# Patient Record
Sex: Female | Born: 1935 | Race: White | Hispanic: No | Marital: Single | Smoking: Never smoker
Health system: Southern US, Community
[De-identification: ages and names within clinical notes are randomized; demographics above are authoritative.]

## PROBLEM LIST (undated history)

## (undated) DIAGNOSIS — F419 Anxiety disorder, unspecified: Secondary | ICD-10-CM

## (undated) DIAGNOSIS — T7840XA Allergy, unspecified, initial encounter: Secondary | ICD-10-CM

## (undated) DIAGNOSIS — I10 Essential (primary) hypertension: Secondary | ICD-10-CM

## (undated) DIAGNOSIS — M1990 Unspecified osteoarthritis, unspecified site: Secondary | ICD-10-CM

## (undated) DIAGNOSIS — E119 Type 2 diabetes mellitus without complications: Secondary | ICD-10-CM

## (undated) HISTORY — DX: Essential (primary) hypertension: I10

## (undated) HISTORY — DX: Unspecified osteoarthritis, unspecified site: M19.90

## (undated) HISTORY — DX: Allergy, unspecified, initial encounter: T78.40XA

## (undated) HISTORY — DX: Anxiety disorder, unspecified: F41.9

---

## 2009-06-16 ENCOUNTER — Ambulatory Visit: Payer: Self-pay | Admitting: Family Medicine

## 2009-06-22 ENCOUNTER — Ambulatory Visit: Payer: Self-pay | Admitting: Family Medicine

## 2009-07-23 ENCOUNTER — Ambulatory Visit: Payer: Self-pay | Admitting: Family Medicine

## 2009-08-22 ENCOUNTER — Ambulatory Visit: Payer: Self-pay | Admitting: Family Medicine

## 2013-11-26 DIAGNOSIS — M25519 Pain in unspecified shoulder: Secondary | ICD-10-CM | POA: Diagnosis not present

## 2013-12-06 DIAGNOSIS — M25519 Pain in unspecified shoulder: Secondary | ICD-10-CM | POA: Diagnosis not present

## 2013-12-11 DIAGNOSIS — M25519 Pain in unspecified shoulder: Secondary | ICD-10-CM | POA: Diagnosis not present

## 2013-12-19 DIAGNOSIS — F411 Generalized anxiety disorder: Secondary | ICD-10-CM | POA: Diagnosis not present

## 2013-12-19 DIAGNOSIS — I10 Essential (primary) hypertension: Secondary | ICD-10-CM | POA: Diagnosis not present

## 2013-12-19 DIAGNOSIS — Z0000 Encounter for general adult medical examination without abnormal findings: Secondary | ICD-10-CM | POA: Diagnosis not present

## 2013-12-19 DIAGNOSIS — M25519 Pain in unspecified shoulder: Secondary | ICD-10-CM | POA: Diagnosis not present

## 2013-12-19 DIAGNOSIS — E119 Type 2 diabetes mellitus without complications: Secondary | ICD-10-CM | POA: Diagnosis not present

## 2013-12-20 DIAGNOSIS — M25519 Pain in unspecified shoulder: Secondary | ICD-10-CM | POA: Diagnosis not present

## 2013-12-23 DIAGNOSIS — M25519 Pain in unspecified shoulder: Secondary | ICD-10-CM | POA: Diagnosis not present

## 2013-12-27 DIAGNOSIS — M25519 Pain in unspecified shoulder: Secondary | ICD-10-CM | POA: Diagnosis not present

## 2014-03-13 DIAGNOSIS — M25519 Pain in unspecified shoulder: Secondary | ICD-10-CM | POA: Diagnosis not present

## 2014-03-13 DIAGNOSIS — F411 Generalized anxiety disorder: Secondary | ICD-10-CM | POA: Diagnosis not present

## 2014-03-13 DIAGNOSIS — Z0000 Encounter for general adult medical examination without abnormal findings: Secondary | ICD-10-CM | POA: Diagnosis not present

## 2014-03-13 DIAGNOSIS — I10 Essential (primary) hypertension: Secondary | ICD-10-CM | POA: Diagnosis not present

## 2014-03-13 DIAGNOSIS — E119 Type 2 diabetes mellitus without complications: Secondary | ICD-10-CM | POA: Diagnosis not present

## 2014-06-19 DIAGNOSIS — M25519 Pain in unspecified shoulder: Secondary | ICD-10-CM | POA: Diagnosis not present

## 2014-06-19 DIAGNOSIS — Z0000 Encounter for general adult medical examination without abnormal findings: Secondary | ICD-10-CM | POA: Diagnosis not present

## 2014-06-19 DIAGNOSIS — I10 Essential (primary) hypertension: Secondary | ICD-10-CM | POA: Diagnosis not present

## 2014-06-19 DIAGNOSIS — F411 Generalized anxiety disorder: Secondary | ICD-10-CM | POA: Diagnosis not present

## 2014-06-19 DIAGNOSIS — E119 Type 2 diabetes mellitus without complications: Secondary | ICD-10-CM | POA: Diagnosis not present

## 2014-07-15 DIAGNOSIS — F411 Generalized anxiety disorder: Secondary | ICD-10-CM | POA: Diagnosis not present

## 2014-07-30 DIAGNOSIS — F411 Generalized anxiety disorder: Secondary | ICD-10-CM | POA: Diagnosis not present

## 2014-08-13 DIAGNOSIS — F411 Generalized anxiety disorder: Secondary | ICD-10-CM | POA: Diagnosis not present

## 2014-08-19 DIAGNOSIS — Z13820 Encounter for screening for osteoporosis: Secondary | ICD-10-CM | POA: Diagnosis not present

## 2014-08-19 DIAGNOSIS — F411 Generalized anxiety disorder: Secondary | ICD-10-CM | POA: Diagnosis not present

## 2014-08-19 DIAGNOSIS — M25519 Pain in unspecified shoulder: Secondary | ICD-10-CM | POA: Diagnosis not present

## 2014-08-19 DIAGNOSIS — Z0000 Encounter for general adult medical examination without abnormal findings: Secondary | ICD-10-CM | POA: Diagnosis not present

## 2014-08-19 DIAGNOSIS — M79609 Pain in unspecified limb: Secondary | ICD-10-CM | POA: Diagnosis not present

## 2014-08-22 DIAGNOSIS — M542 Cervicalgia: Secondary | ICD-10-CM | POA: Diagnosis not present

## 2014-08-22 DIAGNOSIS — M25512 Pain in left shoulder: Secondary | ICD-10-CM | POA: Diagnosis not present

## 2014-08-27 DIAGNOSIS — F411 Generalized anxiety disorder: Secondary | ICD-10-CM | POA: Diagnosis not present

## 2014-08-28 DIAGNOSIS — M25512 Pain in left shoulder: Secondary | ICD-10-CM | POA: Diagnosis not present

## 2014-08-28 DIAGNOSIS — M542 Cervicalgia: Secondary | ICD-10-CM | POA: Diagnosis not present

## 2014-08-28 DIAGNOSIS — Z780 Asymptomatic menopausal state: Secondary | ICD-10-CM | POA: Diagnosis not present

## 2014-08-28 DIAGNOSIS — Z13820 Encounter for screening for osteoporosis: Secondary | ICD-10-CM | POA: Diagnosis not present

## 2014-08-28 LAB — HM DEXA SCAN: HM DEXA SCAN: NORMAL

## 2014-09-02 DIAGNOSIS — M542 Cervicalgia: Secondary | ICD-10-CM | POA: Diagnosis not present

## 2014-09-02 DIAGNOSIS — M25512 Pain in left shoulder: Secondary | ICD-10-CM | POA: Diagnosis not present

## 2014-09-04 DIAGNOSIS — M542 Cervicalgia: Secondary | ICD-10-CM | POA: Diagnosis not present

## 2014-09-04 DIAGNOSIS — M25512 Pain in left shoulder: Secondary | ICD-10-CM | POA: Diagnosis not present

## 2014-09-09 DIAGNOSIS — M542 Cervicalgia: Secondary | ICD-10-CM | POA: Diagnosis not present

## 2014-09-09 DIAGNOSIS — M25512 Pain in left shoulder: Secondary | ICD-10-CM | POA: Diagnosis not present

## 2014-09-10 DIAGNOSIS — F411 Generalized anxiety disorder: Secondary | ICD-10-CM | POA: Diagnosis not present

## 2014-09-11 DIAGNOSIS — M542 Cervicalgia: Secondary | ICD-10-CM | POA: Diagnosis not present

## 2014-09-11 DIAGNOSIS — M25512 Pain in left shoulder: Secondary | ICD-10-CM | POA: Diagnosis not present

## 2014-09-16 DIAGNOSIS — M25512 Pain in left shoulder: Secondary | ICD-10-CM | POA: Diagnosis not present

## 2014-09-16 DIAGNOSIS — M542 Cervicalgia: Secondary | ICD-10-CM | POA: Diagnosis not present

## 2014-09-18 DIAGNOSIS — E119 Type 2 diabetes mellitus without complications: Secondary | ICD-10-CM | POA: Diagnosis not present

## 2014-09-18 DIAGNOSIS — I10 Essential (primary) hypertension: Secondary | ICD-10-CM | POA: Diagnosis not present

## 2014-09-18 DIAGNOSIS — F411 Generalized anxiety disorder: Secondary | ICD-10-CM | POA: Diagnosis not present

## 2014-09-18 DIAGNOSIS — Z1389 Encounter for screening for other disorder: Secondary | ICD-10-CM | POA: Diagnosis not present

## 2014-09-22 DIAGNOSIS — M542 Cervicalgia: Secondary | ICD-10-CM | POA: Diagnosis not present

## 2014-09-22 DIAGNOSIS — M25512 Pain in left shoulder: Secondary | ICD-10-CM | POA: Diagnosis not present

## 2014-09-24 DIAGNOSIS — F411 Generalized anxiety disorder: Secondary | ICD-10-CM | POA: Diagnosis not present

## 2014-10-08 DIAGNOSIS — F411 Generalized anxiety disorder: Secondary | ICD-10-CM | POA: Diagnosis not present

## 2014-10-22 DIAGNOSIS — F411 Generalized anxiety disorder: Secondary | ICD-10-CM | POA: Diagnosis not present

## 2014-11-05 DIAGNOSIS — F411 Generalized anxiety disorder: Secondary | ICD-10-CM | POA: Diagnosis not present

## 2014-11-19 DIAGNOSIS — F411 Generalized anxiety disorder: Secondary | ICD-10-CM | POA: Diagnosis not present

## 2014-12-23 DIAGNOSIS — M542 Cervicalgia: Secondary | ICD-10-CM | POA: Diagnosis not present

## 2014-12-23 DIAGNOSIS — F411 Generalized anxiety disorder: Secondary | ICD-10-CM | POA: Diagnosis not present

## 2014-12-23 DIAGNOSIS — M25512 Pain in left shoulder: Secondary | ICD-10-CM | POA: Diagnosis not present

## 2014-12-23 DIAGNOSIS — M25511 Pain in right shoulder: Secondary | ICD-10-CM | POA: Diagnosis not present

## 2014-12-25 DIAGNOSIS — E119 Type 2 diabetes mellitus without complications: Secondary | ICD-10-CM | POA: Diagnosis not present

## 2014-12-25 DIAGNOSIS — F411 Generalized anxiety disorder: Secondary | ICD-10-CM | POA: Diagnosis not present

## 2014-12-25 DIAGNOSIS — M25519 Pain in unspecified shoulder: Secondary | ICD-10-CM | POA: Diagnosis not present

## 2014-12-25 DIAGNOSIS — I10 Essential (primary) hypertension: Secondary | ICD-10-CM | POA: Diagnosis not present

## 2014-12-25 LAB — HEMOGLOBIN A1C: HEMOGLOBIN A1C: 6.5 % — AB (ref 4.0–6.0)

## 2015-01-13 DIAGNOSIS — M542 Cervicalgia: Secondary | ICD-10-CM | POA: Diagnosis not present

## 2015-01-13 DIAGNOSIS — M25512 Pain in left shoulder: Secondary | ICD-10-CM | POA: Diagnosis not present

## 2015-01-13 DIAGNOSIS — M25511 Pain in right shoulder: Secondary | ICD-10-CM | POA: Diagnosis not present

## 2015-01-14 DIAGNOSIS — F411 Generalized anxiety disorder: Secondary | ICD-10-CM | POA: Diagnosis not present

## 2015-01-15 DIAGNOSIS — M25511 Pain in right shoulder: Secondary | ICD-10-CM | POA: Diagnosis not present

## 2015-01-15 DIAGNOSIS — M25512 Pain in left shoulder: Secondary | ICD-10-CM | POA: Diagnosis not present

## 2015-01-15 DIAGNOSIS — M542 Cervicalgia: Secondary | ICD-10-CM | POA: Diagnosis not present

## 2015-01-21 DIAGNOSIS — M25512 Pain in left shoulder: Secondary | ICD-10-CM | POA: Diagnosis not present

## 2015-01-21 DIAGNOSIS — M25511 Pain in right shoulder: Secondary | ICD-10-CM | POA: Diagnosis not present

## 2015-01-21 DIAGNOSIS — M542 Cervicalgia: Secondary | ICD-10-CM | POA: Diagnosis not present

## 2015-01-22 DIAGNOSIS — M25511 Pain in right shoulder: Secondary | ICD-10-CM | POA: Diagnosis not present

## 2015-01-22 DIAGNOSIS — M542 Cervicalgia: Secondary | ICD-10-CM | POA: Diagnosis not present

## 2015-01-22 DIAGNOSIS — M25512 Pain in left shoulder: Secondary | ICD-10-CM | POA: Diagnosis not present

## 2015-01-27 DIAGNOSIS — M542 Cervicalgia: Secondary | ICD-10-CM | POA: Diagnosis not present

## 2015-01-27 DIAGNOSIS — M25511 Pain in right shoulder: Secondary | ICD-10-CM | POA: Diagnosis not present

## 2015-01-27 DIAGNOSIS — M25512 Pain in left shoulder: Secondary | ICD-10-CM | POA: Diagnosis not present

## 2015-01-28 DIAGNOSIS — F411 Generalized anxiety disorder: Secondary | ICD-10-CM | POA: Diagnosis not present

## 2015-01-29 DIAGNOSIS — M25511 Pain in right shoulder: Secondary | ICD-10-CM | POA: Diagnosis not present

## 2015-01-29 DIAGNOSIS — M25512 Pain in left shoulder: Secondary | ICD-10-CM | POA: Diagnosis not present

## 2015-01-29 DIAGNOSIS — M542 Cervicalgia: Secondary | ICD-10-CM | POA: Diagnosis not present

## 2015-02-03 DIAGNOSIS — M25511 Pain in right shoulder: Secondary | ICD-10-CM | POA: Diagnosis not present

## 2015-02-03 DIAGNOSIS — M542 Cervicalgia: Secondary | ICD-10-CM | POA: Diagnosis not present

## 2015-02-03 DIAGNOSIS — M25512 Pain in left shoulder: Secondary | ICD-10-CM | POA: Diagnosis not present

## 2015-02-10 DIAGNOSIS — M25511 Pain in right shoulder: Secondary | ICD-10-CM | POA: Diagnosis not present

## 2015-02-10 DIAGNOSIS — M25512 Pain in left shoulder: Secondary | ICD-10-CM | POA: Diagnosis not present

## 2015-02-10 DIAGNOSIS — M542 Cervicalgia: Secondary | ICD-10-CM | POA: Diagnosis not present

## 2015-02-12 DIAGNOSIS — M25511 Pain in right shoulder: Secondary | ICD-10-CM | POA: Diagnosis not present

## 2015-02-12 DIAGNOSIS — M25512 Pain in left shoulder: Secondary | ICD-10-CM | POA: Diagnosis not present

## 2015-02-12 DIAGNOSIS — M542 Cervicalgia: Secondary | ICD-10-CM | POA: Diagnosis not present

## 2015-02-17 DIAGNOSIS — M25511 Pain in right shoulder: Secondary | ICD-10-CM | POA: Diagnosis not present

## 2015-02-17 DIAGNOSIS — M25512 Pain in left shoulder: Secondary | ICD-10-CM | POA: Diagnosis not present

## 2015-02-17 DIAGNOSIS — M542 Cervicalgia: Secondary | ICD-10-CM | POA: Diagnosis not present

## 2015-02-18 DIAGNOSIS — F411 Generalized anxiety disorder: Secondary | ICD-10-CM | POA: Diagnosis not present

## 2015-02-19 DIAGNOSIS — M542 Cervicalgia: Secondary | ICD-10-CM | POA: Diagnosis not present

## 2015-02-19 DIAGNOSIS — M25511 Pain in right shoulder: Secondary | ICD-10-CM | POA: Diagnosis not present

## 2015-02-19 DIAGNOSIS — M25512 Pain in left shoulder: Secondary | ICD-10-CM | POA: Diagnosis not present

## 2015-02-21 DIAGNOSIS — M542 Cervicalgia: Secondary | ICD-10-CM | POA: Diagnosis not present

## 2015-02-21 DIAGNOSIS — M25512 Pain in left shoulder: Secondary | ICD-10-CM | POA: Diagnosis not present

## 2015-02-21 DIAGNOSIS — M25511 Pain in right shoulder: Secondary | ICD-10-CM | POA: Diagnosis not present

## 2015-02-24 DIAGNOSIS — M542 Cervicalgia: Secondary | ICD-10-CM | POA: Diagnosis not present

## 2015-02-24 DIAGNOSIS — M25512 Pain in left shoulder: Secondary | ICD-10-CM | POA: Diagnosis not present

## 2015-02-24 DIAGNOSIS — M25511 Pain in right shoulder: Secondary | ICD-10-CM | POA: Diagnosis not present

## 2015-03-03 DIAGNOSIS — M25512 Pain in left shoulder: Secondary | ICD-10-CM | POA: Diagnosis not present

## 2015-03-03 DIAGNOSIS — M542 Cervicalgia: Secondary | ICD-10-CM | POA: Diagnosis not present

## 2015-03-03 DIAGNOSIS — M25511 Pain in right shoulder: Secondary | ICD-10-CM | POA: Diagnosis not present

## 2015-03-05 DIAGNOSIS — M542 Cervicalgia: Secondary | ICD-10-CM | POA: Diagnosis not present

## 2015-03-05 DIAGNOSIS — M25512 Pain in left shoulder: Secondary | ICD-10-CM | POA: Diagnosis not present

## 2015-03-05 DIAGNOSIS — M25511 Pain in right shoulder: Secondary | ICD-10-CM | POA: Diagnosis not present

## 2015-03-10 DIAGNOSIS — M25512 Pain in left shoulder: Secondary | ICD-10-CM | POA: Diagnosis not present

## 2015-03-10 DIAGNOSIS — M542 Cervicalgia: Secondary | ICD-10-CM | POA: Diagnosis not present

## 2015-03-10 DIAGNOSIS — M25511 Pain in right shoulder: Secondary | ICD-10-CM | POA: Diagnosis not present

## 2015-03-12 DIAGNOSIS — M542 Cervicalgia: Secondary | ICD-10-CM | POA: Diagnosis not present

## 2015-03-12 DIAGNOSIS — M25511 Pain in right shoulder: Secondary | ICD-10-CM | POA: Diagnosis not present

## 2015-03-12 DIAGNOSIS — M25512 Pain in left shoulder: Secondary | ICD-10-CM | POA: Diagnosis not present

## 2015-03-17 DIAGNOSIS — M542 Cervicalgia: Secondary | ICD-10-CM | POA: Diagnosis not present

## 2015-03-17 DIAGNOSIS — M25511 Pain in right shoulder: Secondary | ICD-10-CM | POA: Diagnosis not present

## 2015-03-17 DIAGNOSIS — M25512 Pain in left shoulder: Secondary | ICD-10-CM | POA: Diagnosis not present

## 2015-03-18 DIAGNOSIS — F411 Generalized anxiety disorder: Secondary | ICD-10-CM | POA: Diagnosis not present

## 2015-03-19 DIAGNOSIS — M25512 Pain in left shoulder: Secondary | ICD-10-CM | POA: Diagnosis not present

## 2015-03-19 DIAGNOSIS — M25511 Pain in right shoulder: Secondary | ICD-10-CM | POA: Diagnosis not present

## 2015-03-19 DIAGNOSIS — M542 Cervicalgia: Secondary | ICD-10-CM | POA: Diagnosis not present

## 2015-03-23 DIAGNOSIS — M542 Cervicalgia: Secondary | ICD-10-CM | POA: Diagnosis not present

## 2015-03-23 DIAGNOSIS — M25512 Pain in left shoulder: Secondary | ICD-10-CM | POA: Diagnosis not present

## 2015-03-23 DIAGNOSIS — M25511 Pain in right shoulder: Secondary | ICD-10-CM | POA: Diagnosis not present

## 2015-03-24 DIAGNOSIS — M25512 Pain in left shoulder: Secondary | ICD-10-CM | POA: Diagnosis not present

## 2015-03-24 DIAGNOSIS — M25511 Pain in right shoulder: Secondary | ICD-10-CM | POA: Diagnosis not present

## 2015-03-24 DIAGNOSIS — M542 Cervicalgia: Secondary | ICD-10-CM | POA: Diagnosis not present

## 2015-03-26 DIAGNOSIS — M25512 Pain in left shoulder: Secondary | ICD-10-CM | POA: Diagnosis not present

## 2015-03-26 DIAGNOSIS — M542 Cervicalgia: Secondary | ICD-10-CM | POA: Diagnosis not present

## 2015-03-26 DIAGNOSIS — M25511 Pain in right shoulder: Secondary | ICD-10-CM | POA: Diagnosis not present

## 2015-03-31 DIAGNOSIS — M542 Cervicalgia: Secondary | ICD-10-CM | POA: Diagnosis not present

## 2015-03-31 DIAGNOSIS — M25511 Pain in right shoulder: Secondary | ICD-10-CM | POA: Diagnosis not present

## 2015-03-31 DIAGNOSIS — M25512 Pain in left shoulder: Secondary | ICD-10-CM | POA: Diagnosis not present

## 2015-04-01 DIAGNOSIS — I10 Essential (primary) hypertension: Secondary | ICD-10-CM | POA: Diagnosis not present

## 2015-04-01 DIAGNOSIS — M25519 Pain in unspecified shoulder: Secondary | ICD-10-CM | POA: Diagnosis not present

## 2015-04-01 DIAGNOSIS — E119 Type 2 diabetes mellitus without complications: Secondary | ICD-10-CM | POA: Diagnosis not present

## 2015-04-01 DIAGNOSIS — F411 Generalized anxiety disorder: Secondary | ICD-10-CM | POA: Diagnosis not present

## 2015-04-02 DIAGNOSIS — M25512 Pain in left shoulder: Secondary | ICD-10-CM | POA: Diagnosis not present

## 2015-04-02 DIAGNOSIS — M25511 Pain in right shoulder: Secondary | ICD-10-CM | POA: Diagnosis not present

## 2015-04-02 DIAGNOSIS — M542 Cervicalgia: Secondary | ICD-10-CM | POA: Diagnosis not present

## 2015-04-07 DIAGNOSIS — M25511 Pain in right shoulder: Secondary | ICD-10-CM | POA: Diagnosis not present

## 2015-04-07 DIAGNOSIS — M542 Cervicalgia: Secondary | ICD-10-CM | POA: Diagnosis not present

## 2015-04-07 DIAGNOSIS — M25512 Pain in left shoulder: Secondary | ICD-10-CM | POA: Diagnosis not present

## 2015-04-08 DIAGNOSIS — F411 Generalized anxiety disorder: Secondary | ICD-10-CM | POA: Diagnosis not present

## 2015-04-09 DIAGNOSIS — M25511 Pain in right shoulder: Secondary | ICD-10-CM | POA: Diagnosis not present

## 2015-04-09 DIAGNOSIS — M542 Cervicalgia: Secondary | ICD-10-CM | POA: Diagnosis not present

## 2015-04-09 DIAGNOSIS — M25512 Pain in left shoulder: Secondary | ICD-10-CM | POA: Diagnosis not present

## 2015-04-14 DIAGNOSIS — M25512 Pain in left shoulder: Secondary | ICD-10-CM | POA: Diagnosis not present

## 2015-04-14 DIAGNOSIS — M25511 Pain in right shoulder: Secondary | ICD-10-CM | POA: Diagnosis not present

## 2015-04-14 DIAGNOSIS — M542 Cervicalgia: Secondary | ICD-10-CM | POA: Diagnosis not present

## 2015-04-15 DIAGNOSIS — F411 Generalized anxiety disorder: Secondary | ICD-10-CM | POA: Diagnosis not present

## 2015-04-16 DIAGNOSIS — M25511 Pain in right shoulder: Secondary | ICD-10-CM | POA: Diagnosis not present

## 2015-04-16 DIAGNOSIS — M542 Cervicalgia: Secondary | ICD-10-CM | POA: Diagnosis not present

## 2015-04-16 DIAGNOSIS — M25512 Pain in left shoulder: Secondary | ICD-10-CM | POA: Diagnosis not present

## 2015-04-21 DIAGNOSIS — M25511 Pain in right shoulder: Secondary | ICD-10-CM | POA: Diagnosis not present

## 2015-04-21 DIAGNOSIS — M542 Cervicalgia: Secondary | ICD-10-CM | POA: Diagnosis not present

## 2015-04-21 DIAGNOSIS — M25512 Pain in left shoulder: Secondary | ICD-10-CM | POA: Diagnosis not present

## 2015-04-23 DIAGNOSIS — M25511 Pain in right shoulder: Secondary | ICD-10-CM | POA: Diagnosis not present

## 2015-04-23 DIAGNOSIS — M542 Cervicalgia: Secondary | ICD-10-CM | POA: Diagnosis not present

## 2015-04-23 DIAGNOSIS — M25512 Pain in left shoulder: Secondary | ICD-10-CM | POA: Diagnosis not present

## 2015-04-28 DIAGNOSIS — M25512 Pain in left shoulder: Secondary | ICD-10-CM | POA: Diagnosis not present

## 2015-04-28 DIAGNOSIS — M542 Cervicalgia: Secondary | ICD-10-CM | POA: Diagnosis not present

## 2015-04-28 DIAGNOSIS — M25511 Pain in right shoulder: Secondary | ICD-10-CM | POA: Diagnosis not present

## 2015-04-30 DIAGNOSIS — M25512 Pain in left shoulder: Secondary | ICD-10-CM | POA: Diagnosis not present

## 2015-04-30 DIAGNOSIS — M542 Cervicalgia: Secondary | ICD-10-CM | POA: Diagnosis not present

## 2015-04-30 DIAGNOSIS — M25511 Pain in right shoulder: Secondary | ICD-10-CM | POA: Diagnosis not present

## 2015-05-03 DIAGNOSIS — F410 Panic disorder [episodic paroxysmal anxiety] without agoraphobia: Secondary | ICD-10-CM | POA: Insufficient documentation

## 2015-05-03 DIAGNOSIS — M545 Low back pain, unspecified: Secondary | ICD-10-CM | POA: Insufficient documentation

## 2015-05-03 DIAGNOSIS — E119 Type 2 diabetes mellitus without complications: Secondary | ICD-10-CM | POA: Insufficient documentation

## 2015-05-03 DIAGNOSIS — M79603 Pain in arm, unspecified: Secondary | ICD-10-CM | POA: Insufficient documentation

## 2015-05-03 DIAGNOSIS — F419 Anxiety disorder, unspecified: Secondary | ICD-10-CM | POA: Insufficient documentation

## 2015-05-03 DIAGNOSIS — F411 Generalized anxiety disorder: Secondary | ICD-10-CM | POA: Insufficient documentation

## 2015-05-03 DIAGNOSIS — I10 Essential (primary) hypertension: Secondary | ICD-10-CM | POA: Insufficient documentation

## 2015-05-03 DIAGNOSIS — M1990 Unspecified osteoarthritis, unspecified site: Secondary | ICD-10-CM | POA: Insufficient documentation

## 2015-05-03 DIAGNOSIS — J309 Allergic rhinitis, unspecified: Secondary | ICD-10-CM | POA: Insufficient documentation

## 2015-05-06 DIAGNOSIS — F411 Generalized anxiety disorder: Secondary | ICD-10-CM | POA: Diagnosis not present

## 2015-05-20 DIAGNOSIS — F411 Generalized anxiety disorder: Secondary | ICD-10-CM | POA: Diagnosis not present

## 2015-05-22 DIAGNOSIS — F411 Generalized anxiety disorder: Secondary | ICD-10-CM | POA: Diagnosis not present

## 2015-05-29 ENCOUNTER — Other Ambulatory Visit: Payer: Self-pay | Admitting: Family Medicine

## 2015-05-29 DIAGNOSIS — E119 Type 2 diabetes mellitus without complications: Secondary | ICD-10-CM

## 2015-06-24 DIAGNOSIS — F411 Generalized anxiety disorder: Secondary | ICD-10-CM | POA: Diagnosis not present

## 2015-07-08 DIAGNOSIS — F411 Generalized anxiety disorder: Secondary | ICD-10-CM | POA: Diagnosis not present

## 2015-07-15 ENCOUNTER — Encounter: Payer: Self-pay | Admitting: Family Medicine

## 2015-07-15 ENCOUNTER — Ambulatory Visit (INDEPENDENT_AMBULATORY_CARE_PROVIDER_SITE_OTHER): Payer: Medicare Other | Admitting: Family Medicine

## 2015-07-15 VITALS — BP 152/90 | HR 60 | Temp 98.2°F | Resp 16 | Ht <= 58 in | Wt 132.6 lb

## 2015-07-15 DIAGNOSIS — F419 Anxiety disorder, unspecified: Secondary | ICD-10-CM | POA: Diagnosis not present

## 2015-07-15 DIAGNOSIS — J309 Allergic rhinitis, unspecified: Secondary | ICD-10-CM

## 2015-07-15 DIAGNOSIS — E119 Type 2 diabetes mellitus without complications: Secondary | ICD-10-CM

## 2015-07-15 DIAGNOSIS — R202 Paresthesia of skin: Secondary | ICD-10-CM | POA: Diagnosis not present

## 2015-07-15 DIAGNOSIS — I10 Essential (primary) hypertension: Secondary | ICD-10-CM | POA: Diagnosis not present

## 2015-07-15 DIAGNOSIS — F411 Generalized anxiety disorder: Secondary | ICD-10-CM | POA: Diagnosis not present

## 2015-07-15 LAB — POCT GLYCOSYLATED HEMOGLOBIN (HGB A1C): Hemoglobin A1C: 6.7

## 2015-07-15 MED ORDER — VITAMIN B-12 1000 MCG PO TABS: 1000.0000 ug | ORAL_TABLET | Freq: Every day | ORAL | Status: AC

## 2015-07-15 NOTE — Progress Notes (Signed)
Subjective:    Patient ID: Sierra Lloyd, female    DOB: [birth year], 81 y.o.   MRN: [id number]  Diabetes Associated symptoms include polyuria.    Diabetes Mellitus Type II, Follow-up:   Here for recheck on her diabetes.  Lab Results  Component Value Date   HGBA1C 6.5* 12/25/2014    Last seen for diabetes 3 months ago.  Management changes included none. She reports excellent compliance with treatment. She is not having side effects. Current symptoms include polyuria and have been unchanged. Home blood sugar records: fasting range: 130-150  Episodes of hypoglycemia? no   Current Insulin Regimen: none  Most Recent Eye Exam:  Weight trend: stable Prior visit with dietician: no Current diet: well balanced Current exercise: PT times 3 months per pt  Pertinent Labs: No results found for: CHOL, TRIG, CHOLHDL, CREATININE   Wt Readings from Last 3 Encounters:  04/01/15 131 lb (59.421 kg)   Still having a really hard time related to the death of her brother in law. Having night terrors.  Working with her therapist every 2 weeks.  Stressed about the new computer system today.  Really just having a hard time today.    Has finished her therapy. Had a hard time with getting her insurance straight. That stressed her also.   Is really missing Sierra Lloyd thru all of this.   Her new therapist is not a friendly as Sierra Lloyd. Feels she helped her more than anybody.  ------------------------------------------------------------------------    Review of Systems  Constitutional: Negative.   Respiratory: Negative.   Cardiovascular: Negative.   Endocrine: Positive for polyuria.    Patient Active Problem List   Diagnosis Date Noted  . Allergic rhinitis 05/03/2015  . Anxiety 05/03/2015  . Arm pain 05/03/2015  . Arthritis 05/03/2015  . Diabetes 05/03/2015  . Anxiety, generalized 05/03/2015  . BP (high blood pressure) 05/03/2015  . LBP (low back pain) 05/03/2015  . Panic attack  05/03/2015   Past Medical History  Diagnosis Date  . Allergy   . Anxiety   . Hypertension   . Arthritis    Current Outpatient Prescriptions on File Prior to Visit  Medication Sig  . ALPRAZolam (XANAX) 0.25 MG tablet Take 0.25 mg by mouth.   . bisoprolol-hydrochlorothiazide (ZIAC) 10-6.25 MG per tablet Take 1 tablet by mouth daily.   . cholecalciferol (VITAMIN D) 1000 UNITS tablet Take 1,000 Units by mouth.   . etodolac (LODINE) 500 MG tablet Take 500 mg by mouth daily.   . Garlic Oil 500 MG CAPS Take by mouth.   Marland Kitchen glucose blood (BAYER CONTOUR TEST) test strip BAYER CONTOUR TEST (In Vitro Strip)  1 Strip Strip to test blood sugars for 0 days  Quantity: 50;  Refills: 5   Ordered :04-Oct-2014  Lorie Phenix MD;  Started 23-Sep-2014 Active Comments: E11.9 Bayer Contour Next test strips only.  . metFORMIN (GLUCOPHAGE) 500 MG tablet TAKE 1 TABLET BY MOUTH TWICE DAILY.  Marland Kitchen OMEGA-3 FATTY ACIDS PO Take by mouth.   . TRIAMCINOLONE ACETONIDE, NASAL STEROIDS, Place 2 sprays into the nose daily.   Marland Kitchen VITAMIN E-1000 PO Take by mouth.    No current facility-administered medications on file prior to visit.   Allergies  Allergen Reactions  . Tetracycline     Roof of mouth broke out.   History reviewed. No pertinent past surgical history. Social History   Social History  . Marital Status: Single    Spouse Name: N/A  . Number of Children: N/A  .  Years of Education: N/A   Occupational History  . Not on file.   Social History Main Topics  . Smoking status: Never Smoker   . Smokeless tobacco: Never Used  . Alcohol Use: No  . Drug Use: No  . Sexual Activity: Not on file   Other Topics Concern  . Not on file   Social History Narrative   Family History  Problem Relation Age of Onset  . Asthma Mother   . Anxiety disorder Mother   . Lung disease Mother   . COPD Sister         Objective:   Physical Exam  Constitutional: She is oriented to person, place, and time. She appears  well-developed and well-nourished.  Neurological: She is alert and oriented to person, place, and time.   Diabetic Foot Exam - Simple   Simple Foot Form  Visual Inspection  No deformities, no ulcerations, no other skin breakdown bilaterally:  Yes  Sensation Testing  Intact to touch and monofilament testing bilaterally:  Yes  Pulse Check  Posterior Tibialis and Dorsalis pulse intact bilaterally:  Yes  Comments      BP 152/90 mmHg  Pulse 60  Temp(Src) 98.2 F (36.8 C) (Oral)  Resp 16  Ht 4\' 9"  (1.448 m)  Wt 132 lb 9.6 oz (60.147 kg)  BMI 28.69 kg/m2      Assessment & Plan:  1. Type 2 diabetes mellitus without complication Stable. Continue current medication and plan of care.  - POCT glycosylated hemoglobin (Hb A1C) Results for orders placed or performed in visit on 07/15/15  POCT glycosylated hemoglobin (Hb A1C)  Result Value Ref Range   Hemoglobin A1C 6.7     2. Essential hypertension Stable. Current medication.   3. Allergic rhinitis, unspecified allergic rhinitis type Stable.   4. Anxiety, generalized Worsening. Will follow up with therapist.   5. Paresthesias- Will try a B12 supplement. Refused labs today. Has needle phobia.   Lorie Phenix, MD

## 2015-07-24 ENCOUNTER — Other Ambulatory Visit: Payer: Self-pay | Admitting: Family Medicine

## 2015-07-24 DIAGNOSIS — F411 Generalized anxiety disorder: Secondary | ICD-10-CM

## 2015-07-24 NOTE — Telephone Encounter (Signed)
Ok to call in rx.  Thanks.  

## 2015-07-24 NOTE — Telephone Encounter (Signed)
Xanax called into pharmacy. sd

## 2015-08-05 DIAGNOSIS — F411 Generalized anxiety disorder: Secondary | ICD-10-CM | POA: Diagnosis not present

## 2015-08-19 DIAGNOSIS — F411 Generalized anxiety disorder: Secondary | ICD-10-CM | POA: Diagnosis not present

## 2015-09-02 DIAGNOSIS — F411 Generalized anxiety disorder: Secondary | ICD-10-CM | POA: Diagnosis not present

## 2015-09-11 ENCOUNTER — Other Ambulatory Visit: Payer: Self-pay | Admitting: Family Medicine

## 2015-09-11 DIAGNOSIS — E119 Type 2 diabetes mellitus without complications: Secondary | ICD-10-CM

## 2015-09-16 DIAGNOSIS — F341 Dysthymic disorder: Secondary | ICD-10-CM | POA: Diagnosis not present

## 2015-09-23 DIAGNOSIS — F341 Dysthymic disorder: Secondary | ICD-10-CM | POA: Diagnosis not present

## 2015-10-07 DIAGNOSIS — F341 Dysthymic disorder: Secondary | ICD-10-CM | POA: Diagnosis not present

## 2015-10-15 ENCOUNTER — Other Ambulatory Visit: Payer: Self-pay | Admitting: Family Medicine

## 2015-10-15 DIAGNOSIS — M1990 Unspecified osteoarthritis, unspecified site: Secondary | ICD-10-CM

## 2015-10-15 DIAGNOSIS — M545 Low back pain: Secondary | ICD-10-CM

## 2015-10-15 DIAGNOSIS — I10 Essential (primary) hypertension: Secondary | ICD-10-CM

## 2015-10-15 NOTE — Telephone Encounter (Signed)
Appointment scheduled for 10/22/2015. Allene DillonEmily Drozdowski, CMA

## 2015-10-22 ENCOUNTER — Ambulatory Visit: Payer: Medicare Other | Admitting: Family Medicine

## 2015-11-03 ENCOUNTER — Ambulatory Visit: Payer: Medicare Other | Admitting: Family Medicine

## 2015-11-04 DIAGNOSIS — F341 Dysthymic disorder: Secondary | ICD-10-CM | POA: Diagnosis not present

## 2015-11-12 ENCOUNTER — Other Ambulatory Visit: Payer: Self-pay | Admitting: Family Medicine

## 2015-11-12 DIAGNOSIS — E119 Type 2 diabetes mellitus without complications: Secondary | ICD-10-CM

## 2015-11-18 ENCOUNTER — Encounter: Payer: Self-pay | Admitting: Family Medicine

## 2015-11-18 ENCOUNTER — Ambulatory Visit (INDEPENDENT_AMBULATORY_CARE_PROVIDER_SITE_OTHER): Payer: Medicare Other | Admitting: Family Medicine

## 2015-11-18 VITALS — BP 120/80 | HR 64 | Temp 98.7°F | Resp 16 | Ht <= 58 in | Wt 133.0 lb

## 2015-11-18 DIAGNOSIS — I10 Essential (primary) hypertension: Secondary | ICD-10-CM

## 2015-11-18 DIAGNOSIS — E119 Type 2 diabetes mellitus without complications: Secondary | ICD-10-CM | POA: Diagnosis not present

## 2015-11-18 DIAGNOSIS — F411 Generalized anxiety disorder: Secondary | ICD-10-CM

## 2015-11-18 LAB — POCT GLYCOSYLATED HEMOGLOBIN (HGB A1C)
Est. average glucose Bld gHb Est-mCnc: 154
HEMOGLOBIN A1C: 7

## 2015-11-18 NOTE — Progress Notes (Signed)
Patient ID: Sierra Lloyd, female   DOB: 1934/02/03, 81 y.o.   MRN: [id number]        Patient: Sierra Lloyd Female    DOB: [birth year]   81 y.o.   MRN: [id number] Visit Date: 11/18/2015  Today's Provider: Lorie Phenix, MD   Chief Complaint  Patient presents with  . Diabetes  . Anxiety  . Hypertension   Subjective:    HPI  Diabetes Mellitus Type II, Follow-up:   Lab Results  Component Value Date   HGBA1C 7.0 11/18/2015   HGBA1C 6.7 07/15/2015   HGBA1C 6.5* 12/25/2014    Last seen for diabetes 4 months ago.  Management since then includes none. She reports excellent compliance with treatment. She is not having side effects.  Current symptoms include none and have been stable. Home blood sugar records:  Fasting 120s to 150s.  Mainly in the 130s.  136 this am.   Episodes of hypoglycemia? yes - 1 or 2 times in the last 4 months.  Know to eat sweets.     Current Insulin Regimen: none Most Recent Eye Exam: up to date Weight trend: stable Prior visit with dietician: no Current diet: in general, a "healthy" diet    Has lost her taste for sweets.  Current exercise: PT  Has not had Christmas yet.  Sister has been sick.   Pertinent Labs: No results found for: CHOL, TRIG, HDL, LDLCALC, CREATININE  Wt Readings from Last 3 Encounters:  11/18/15 133 lb (60.328 kg)  07/15/15 132 lb 9.6 oz (60.147 kg)  04/01/15 131 lb (59.421 kg)    ------------------------------------------------------------------------  Anxiety, follow-up: Patient her to follow-up on anxiety disorder.  She has the following symptoms: panic attacks. Onset of symptoms was approximately many years  ago, gradually worsening since that time. She denies current suicidal and homicidal ideation. Family history significant for no psychiatric illness.Possible organic causes contributing are: none. Risk factors: none Previous treatment includes Xanax and medication.  She complains of the following side effects from  the treatment: none. Still having a lot of anxiety.  Worries a lot about loosing her independence.     Hypertension, follow-up:  BP Readings from Last 3 Encounters:  11/18/15 120/80  07/15/15 152/90  04/01/15 138/84    She was last seen for hypertension 4 months ago.  BP at that visit was 152/90. Management changes since that visit include none. She reports excellent compliance with treatment. She is not having side effects.  She is exercising. She is adherent to low salt diet.   Outside blood pressures are stable. She is experiencing none.  Patient denies chest pain.   Cardiovascular risk factors include diabetes mellitus.  Use of agents associated with hypertension: none.     Weight trend: stable Wt Readings from Last 3 Encounters:  11/18/15 133 lb (60.328 kg)  07/15/15 132 lb 9.6 oz (60.147 kg)  04/01/15 131 lb (59.421 kg)    Current diet: in general, a "healthy" diet  . Has lost her taste for sweets.    ------------------------------------------------------------------------       Allergies  Allergen Reactions  . Tetracycline     Roof of mouth broke out.   Previous Medications   BISOPROLOL-HYDROCHLOROTHIAZIDE (ZIAC) 10-6.25 MG TABLET    TAKE 1 TABLET BY MOUTH ONCE DAILY.   CHOLECALCIFEROL (VITAMIN D) 1000 UNITS TABLET    Take 1,000 Units by mouth.    ETODOLAC (LODINE) 500 MG TABLET    TAKE 1 TABLET BY MOUTH TWICE DAILY AS  NEEDED FOR PAIN.   GARLIC OIL 500 MG CAPS    Take by mouth.    GLUCOSE BLOOD TEST STRIP    Use to test blood sugar once a day.  DX: E11.9.   METFORMIN (GLUCOPHAGE) 500 MG TABLET    TAKE 1 TABLET BY MOUTH TWICE DAILY.   OMEGA-3 FATTY ACIDS PO    Take by mouth.    TRIAMCINOLONE ACETONIDE, NASAL STEROIDS,    Place 2 sprays into the nose daily.    VITAMIN B-12 (CYANOCOBALAMIN) 1000 MCG TABLET    Take 1 tablet (1,000 mcg total) by mouth daily.   VITAMIN E-1000 PO    Take by mouth.    XANAX 0.25 MG TABLET    TAKE 1/2 OR 1 TABLET BY MOUTH TWICE  DAILY AS NEEDED.    Review of Systems  Constitutional: Positive for activity change.  Respiratory: Negative.   Cardiovascular: Negative.   Psychiatric/Behavioral: Negative for suicidal ideas, confusion and dysphoric mood. The patient is nervous/anxious.     Social History  Substance Use Topics  . Smoking status: Never Smoker   . Smokeless tobacco: Never Used  . Alcohol Use: No   Objective:   BP 120/80 mmHg  Pulse 64  Temp(Src) 98.7 F (37.1 C) (Oral)  Resp 16  Ht 4\' 9"  (1.448 m)  Wt 133 lb (60.328 kg)  BMI 28.77 kg/m2  SpO2 98%  Physical Exam  Constitutional: She is oriented to person, place, and time. She appears well-developed and well-nourished.  Cardiovascular: Normal rate and regular rhythm.   Pulmonary/Chest: Effort normal and breath sounds normal.  Neurological: She is alert and oriented to person, place, and time.  Psychiatric: She has a normal mood and affect. Her behavior is normal. Judgment and thought content normal.      Assessment & Plan:     1. Diabetes mellitus without complication (HCC) Stable. At goal. No change in treatment plan.  Recheck in 3 months. Monitor for lows.   - POCT glycosylated hemoglobin (Hb A1C) Results for orders placed or performed in visit on 11/18/15  POCT glycosylated hemoglobin (Hb A1C)  Result Value Ref Range   Hemoglobin A1C 7.0    Est. average glucose Bld gHb Est-mCnc 154   .  2. Anxiety, generalized Unchanged. Continue current medication and follow up with specialist.   3. Essential hypertension Condition is stable. Please continue current medication and  plan of care.      Lorie PhenixNancy Brianca Fortenberry, MD  [facility]

## 2015-12-09 DIAGNOSIS — F411 Generalized anxiety disorder: Secondary | ICD-10-CM | POA: Diagnosis not present

## 2015-12-23 DIAGNOSIS — F411 Generalized anxiety disorder: Secondary | ICD-10-CM | POA: Diagnosis not present

## 2016-01-06 DIAGNOSIS — F411 Generalized anxiety disorder: Secondary | ICD-10-CM | POA: Diagnosis not present

## 2016-02-04 ENCOUNTER — Other Ambulatory Visit: Payer: Self-pay | Admitting: Family Medicine

## 2016-02-04 DIAGNOSIS — F411 Generalized anxiety disorder: Secondary | ICD-10-CM

## 2016-02-04 NOTE — Telephone Encounter (Signed)
LOV 11/18/2015. Last refill 07/24/2015.

## 2016-02-04 NOTE — Telephone Encounter (Signed)
Pt called saying she needs to get the xanax today.  She will check with the pharmacy around lunch.   Thanks Barth Kirkseri

## 2016-02-04 NOTE — Telephone Encounter (Signed)
Printed, please fax or call in to pharmacy. Thank you.   

## 2016-02-18 ENCOUNTER — Ambulatory Visit: Payer: Medicare Other | Admitting: Family Medicine

## 2016-02-25 ENCOUNTER — Encounter: Payer: Self-pay | Admitting: Family Medicine

## 2016-02-25 ENCOUNTER — Ambulatory Visit (INDEPENDENT_AMBULATORY_CARE_PROVIDER_SITE_OTHER): Payer: Medicare Other | Admitting: Family Medicine

## 2016-02-25 VITALS — BP 138/84 | HR 60 | Temp 97.9°F | Resp 16 | Ht <= 58 in | Wt 126.0 lb

## 2016-02-25 DIAGNOSIS — I10 Essential (primary) hypertension: Secondary | ICD-10-CM | POA: Diagnosis not present

## 2016-02-25 DIAGNOSIS — E119 Type 2 diabetes mellitus without complications: Secondary | ICD-10-CM

## 2016-02-25 DIAGNOSIS — F411 Generalized anxiety disorder: Secondary | ICD-10-CM | POA: Diagnosis not present

## 2016-02-25 LAB — POCT GLYCOSYLATED HEMOGLOBIN (HGB A1C)
Est. average glucose Bld gHb Est-mCnc: 148
Hemoglobin A1C: 6.8
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
## 2016-03-23 DIAGNOSIS — E119 Type 2 diabetes mellitus without complications: Secondary | ICD-10-CM | POA: Diagnosis not present

## 2016-03-24 ENCOUNTER — Encounter: Payer: Self-pay | Admitting: Family Medicine

## 2016-04-02 DIAGNOSIS — H269 Unspecified cataract: Secondary | ICD-10-CM | POA: Diagnosis not present

## 2016-04-07 ENCOUNTER — Encounter: Payer: Self-pay | Admitting: *Deleted

## 2016-04-11 NOTE — H&P (Signed)
See scanned note.

## 2016-04-12 ENCOUNTER — Ambulatory Visit
Admission: RE | Admit: 2016-04-12 | Discharge: 2016-04-12 | Disposition: A | Discharge status: Home / Self Care | Payer: Medicare Other | Source: Ambulatory Visit | Attending: Ophthalmology | Admitting: Ophthalmology

## 2016-04-12 ENCOUNTER — Encounter
Admission: RE | Disposition: A | Discharge status: Home / Self Care | Payer: Self-pay | Source: Ambulatory Visit | Attending: Ophthalmology

## 2016-04-12 ENCOUNTER — Ambulatory Visit: Payer: Medicare Other | Admitting: Anesthesiology

## 2016-04-12 ENCOUNTER — Encounter: Payer: Self-pay | Admitting: *Deleted

## 2016-04-12 DIAGNOSIS — H2512 Age-related nuclear cataract, left eye: Secondary | ICD-10-CM | POA: Diagnosis not present

## 2016-04-12 DIAGNOSIS — H25042 Posterior subcapsular polar age-related cataract, left eye: Secondary | ICD-10-CM | POA: Diagnosis not present

## 2016-04-12 DIAGNOSIS — M1990 Unspecified osteoarthritis, unspecified site: Secondary | ICD-10-CM | POA: Insufficient documentation

## 2016-04-12 DIAGNOSIS — H268 Other specified cataract: Secondary | ICD-10-CM | POA: Diagnosis present

## 2016-04-12 DIAGNOSIS — I10 Essential (primary) hypertension: Secondary | ICD-10-CM | POA: Insufficient documentation

## 2016-04-12 DIAGNOSIS — H259 Unspecified age-related cataract: Secondary | ICD-10-CM | POA: Diagnosis not present

## 2016-04-12 DIAGNOSIS — F419 Anxiety disorder, unspecified: Secondary | ICD-10-CM | POA: Diagnosis not present

## 2016-04-12 HISTORY — DX: Type 2 diabetes mellitus without complications: E11.9

## 2016-04-12 HISTORY — PX: CATARACT EXTRACTION W/PHACO: SHX586

## 2016-04-12 LAB — GLUCOSE, CAPILLARY: GLUCOSE-CAPILLARY: 149 mg/dL — AB (ref 65–99)

## 2016-04-12 SURGERY — PHACOEMULSIFICATION, CATARACT, WITH IOL INSERTION
Anesthesia: Monitor Anesthesia Care | Site: Eye | Laterality: Left | Wound class: Clean

## 2016-04-12 MED ORDER — SODIUM CHLORIDE 0.9 % IV SOLN
INTRAVENOUS | Status: DC
  Administered 2016-04-12: 08:00:00 via INTRAVENOUS

## 2016-04-12 MED ORDER — PHENYLEPHRINE HCL 10 % OP SOLN
1.0000 [drp] | OPHTHALMIC | Status: AC
  Administered 2016-04-12 (×4): 1 [drp] via OPHTHALMIC

## 2016-04-12 MED ORDER — LIDOCAINE HCL (PF) 4 % IJ SOLN
INTRAMUSCULAR | Status: DC | PRN
  Administered 2016-04-12: .5 mL via OPHTHALMIC

## 2016-04-12 MED ORDER — MOXIFLOXACIN HCL 0.5 % OP SOLN
1.0000 [drp] | OPHTHALMIC | Status: AC
  Administered 2016-04-12 (×3): 1 [drp] via OPHTHALMIC

## 2016-04-12 MED ORDER — ALPRAZOLAM 0.5 MG PO TABS
ORAL_TABLET | ORAL | Status: AC
  Administered 2016-04-12: 0.25 mg via ORAL
  Filled 2016-04-12: qty 1

## 2016-04-12 MED ORDER — POVIDONE-IODINE 5 % OP SOLN
OPHTHALMIC | Status: AC
  Filled 2016-04-12: qty 30

## 2016-04-12 MED ORDER — CYCLOPENTOLATE HCL 2 % OP SOLN
1.0000 [drp] | OPHTHALMIC | Status: AC
Start: 2016-04-12 — End: 2016-04-12
  Administered 2016-04-12 (×4): 1 [drp] via OPHTHALMIC

## 2016-04-12 MED ORDER — MIDAZOLAM HCL 5 MG/5ML IJ SOLN
INTRAMUSCULAR | Status: DC | PRN
  Administered 2016-04-12: 1 mg via INTRAVENOUS

## 2016-04-12 MED ORDER — CYCLOPENTOLATE HCL 2 % OP SOLN
OPHTHALMIC | Status: AC
  Administered 2016-04-12: 1 [drp] via OPHTHALMIC
  Filled 2016-04-12: qty 2

## 2016-04-12 MED ORDER — ALPRAZOLAM 0.5 MG PO TABS
0.2500 mg | ORAL_TABLET | Freq: Once | ORAL | Status: AC | PRN
  Administered 2016-04-12: 0.25 mg via ORAL

## 2016-04-12 MED ORDER — PHENYLEPHRINE HCL 10 % OP SOLN
OPHTHALMIC | Status: AC
  Administered 2016-04-12: 1 [drp] via OPHTHALMIC
  Filled 2016-04-12: qty 5

## 2016-04-12 MED ORDER — MOXIFLOXACIN HCL 0.5 % OP SOLN
OPHTHALMIC | Status: DC | PRN
  Administered 2016-04-12: 1 [drp] via OPHTHALMIC

## 2016-04-12 MED ORDER — NA CHONDROIT SULF-NA HYALURON 40-17 MG/ML IO SOLN
INTRAOCULAR | Status: AC
  Filled 2016-04-12: qty 1

## 2016-04-12 MED ORDER — BUPIVACAINE HCL (PF) 0.75 % IJ SOLN
INTRAMUSCULAR | Status: AC
  Filled 2016-04-12: qty 10

## 2016-04-12 MED ORDER — CARBACHOL 0.01 % IO SOLN
INTRAOCULAR | Status: DC | PRN
  Administered 2016-04-12: .5 mL via INTRAOCULAR

## 2016-04-12 MED ORDER — EPINEPHRINE HCL 1 MG/ML IJ SOLN
INTRAOCULAR | Status: DC | PRN
  Administered 2016-04-12: 1 mL via OPHTHALMIC

## 2016-04-12 MED ORDER — POVIDONE-IODINE 5 % OP SOLN
OPHTHALMIC | Status: DC | PRN
  Administered 2016-04-12: 1 via OPHTHALMIC

## 2016-04-12 MED ORDER — TETRACAINE HCL 0.5 % OP SOLN
OPHTHALMIC | Status: AC
  Filled 2016-04-12: qty 2

## 2016-04-12 MED ORDER — HYALURONIDASE HUMAN 150 UNIT/ML IJ SOLN
INTRAMUSCULAR | Status: AC
  Filled 2016-04-12: qty 1

## 2016-04-12 MED ORDER — CEFUROXIME OPHTHALMIC INJECTION 1 MG/0.1 ML
INJECTION | OPHTHALMIC | Status: AC
  Filled 2016-04-12: qty 0.1

## 2016-04-12 MED ORDER — LIDOCAINE HCL (PF) 4 % IJ SOLN
INTRAMUSCULAR | Status: AC
  Filled 2016-04-12: qty 5

## 2016-04-12 MED ORDER — LIDOCAINE HCL (PF) 4 % IJ SOLN
INTRAMUSCULAR | Status: DC | PRN
  Administered 2016-04-12: 4 mL via OPHTHALMIC

## 2016-04-12 MED ORDER — CEFUROXIME OPHTHALMIC INJECTION 1 MG/0.1 ML
INJECTION | OPHTHALMIC | Status: DC | PRN
  Administered 2016-04-12: .1 mL via INTRACAMERAL

## 2016-04-12 MED ORDER — TRYPAN BLUE 0.06 % OP SOLN
OPHTHALMIC | Status: AC
  Filled 2016-04-12: qty 0.5

## 2016-04-12 MED ORDER — NA CHONDROIT SULF-NA HYALURON 40-17 MG/ML IO SOLN
INTRAOCULAR | Status: DC | PRN
  Administered 2016-04-12: 1 mL via INTRAOCULAR

## 2016-04-12 MED ORDER — EPINEPHRINE HCL 1 MG/ML IJ SOLN
INTRAMUSCULAR | Status: AC
  Filled 2016-04-12: qty 2

## 2016-04-12 MED ORDER — ALFENTANIL 500 MCG/ML IJ INJ
INJECTION | INTRAMUSCULAR | Status: DC | PRN
  Administered 2016-04-12: 500 ug via INTRAVENOUS

## 2016-04-12 MED ORDER — MOXIFLOXACIN HCL 0.5 % OP SOLN
OPHTHALMIC | Status: AC
  Administered 2016-04-12: 1 [drp] via OPHTHALMIC
  Filled 2016-04-12: qty 3

## 2016-04-12 MED ORDER — TETRACAINE HCL 0.5 % OP SOLN
OPHTHALMIC | Status: DC | PRN
  Administered 2016-04-12: 1 [drp] via OPHTHALMIC

## 2016-04-12 SURGICAL SUPPLY — 29 items

## 2016-04-12 NOTE — Anesthesia Preprocedure Evaluation (Signed)
Anesthesia Evaluation  Patient identified by MRN, date of birth, ID band  Reviewed: Allergy & Precautions, NPO status , Patient's Chart, lab work & pertinent test results  History of Anesthesia Complications Negative for: history of anesthetic complications  Airway Mallampati: II       Dental  (+) Partial Upper   Pulmonary neg pulmonary ROS,           Cardiovascular hypertension, Pt. on medications      Neuro/Psych Anxiety negative neurological ROS     GI/Hepatic negative GI ROS, Neg liver ROS,   Endo/Other  diabetes, Type 2, Oral Hypoglycemic Agents  Renal/GU negative Renal ROS     Musculoskeletal   Abdominal   Peds  Hematology negative hematology ROS (+)   Anesthesia Other Findings   Reproductive/Obstetrics                             Anesthesia Physical Anesthesia Plan  ASA: II  Anesthesia Plan: MAC   Post-op Pain Management:    Induction: Intravenous  Airway Management Planned: Nasal Cannula  Additional Equipment:   Intra-op Plan:   Post-operative Plan:   Informed Consent: I have reviewed the patients History and Physical, chart, labs and discussed the procedure including the risks, benefits and alternatives for the proposed anesthesia with the patient or authorized representative who has indicated his/her understanding and acceptance.     Plan Discussed with:   Anesthesia Plan Comments:         Anesthesia Quick Evaluation

## 2016-04-12 NOTE — Op Note (Signed)
Date of Surgery: 04/12/2016 Date of Dictation: 04/12/2016 8:47 AM Pre-operative Diagnosis:  Nuclear Sclerotic Cataract and Posterior Subcapsular Cataract left Eye Post-operative Diagnosis: same Procedure performed: Extra-capsular Cataract Extraction (ECCE) with placement of a posterior chamber intraocular lens (IOL) left Eye IOL:  Implant Name Type Inv. Item Serial No. Manufacturer Lot No. LRB No. Used  LENS IOL ACRYSOF IQ 22.0 - [id number] Intraocular Lens LENS IOL ACRYSOF IQ 22.0 [id number] ALCON [id number] Left 1   Anesthesia: 2% Lidocaine and 4% Marcaine in a 50/50 mixture with 10 unites/ml of Hylenex given as a peribulbar Anesthesiologist: Anesthesiologist: Naomie DeanWilliam K Kephart, MD CRNA: Irving BurtonJennifer Bachich, CRNA; Michaele OfferKasey Savage, CRNA Complications: none Estimated Blood Loss: less than 1 ml  Description of procedure:  The patient was given anesthesia and sedation via intravenous access. The patient was then prepped and draped in the usual fashion. A 25-gauge needle was bent for initiating the capsulorhexis. A 5-0 silk suture was placed through the conjunctiva superior and inferiorly to serve as bridle sutures. Hemostasis was obtained at the superior limbus using an eraser cautery. A partial thickness groove was made at the anterior surgical limbus with a 64 Beaver blade and this was dissected anteriorly with an SYSCOlcon Crescent knife. The anterior chamber was entered at 10 o'clock with a 1.0 mm paracentesis knife and through the lamellar dissection with a 2.6 mm Alcon keratome. Epi-Shugarcaine 0.5 CC [9 cc BSS Plus (Alcon), 3 cc 4% preservative-free lidocaine (Hospira) and 4 cc 1:1000 preservative-free, bisulfite-free epinephrine] was injected into the anterior chamber via the paracentesis tract. Epi-Shugarcaine 0.5 CC [9 cc BSS Plus (Alcon), 3 cc 4% preservative-free lidocaine (Hospira) and 4 cc 1:1000 preservative-free, bisulfite-free epinephrine] was injected into the anterior chamber via the  paracentesis tract. DiscoVisc was injected to replace the aqueous and a continuous tear curvilinear capsulorhexis was performed using a bent 25-gauge needle.  Balance salt on a syringe was used to perform hydro-dissection and phacoemulsification was carried out using a divide and conquer technique. Procedure(s) with comments: CATARACT EXTRACTION PHACO AND INTRAOCULAR LENS PLACEMENT (IOC) (Left) - US  02:06 AP% 26.8 CDE 55.05 fluid pack lot # [id number]. Irrigation/aspiration was used to remove the residual cortex and the capsular bag was inflated with DiscoVisc. The intraocular lens was inserted into the capsular bag using a pre-loaded UltraSert Delivery System. Irrigation/aspiration was used to remove the residual DiscoVisc. The wound was inflated with balanced salt and checked for leaks. None were found. Miostat was injected via the paracentesis track and 0.1 ml of cefuroxime containing 1 mg of drug  was injected via the paracentesis track. The wound was checked for leaks again and none were found.   The bridal sutures were removed and two drops of Vigamox were placed on the eye. An eye shield was placed to protect the eye and the patient was discharged to the recovery area in good condition.   Imraan Wendell MD

## 2016-04-12 NOTE — Interval H&P Note (Signed)
History and Physical Interval Note:  04/12/2016 7:23 AM  Sierra Lloyd  has presented today for surgery, with the diagnosis of cataract  The various methods of treatment have been discussed with the patient and family. After consideration of risks, benefits and other options for treatment, the patient has consented to  Procedure(s): CATARACT EXTRACTION PHACO AND INTRAOCULAR LENS PLACEMENT (IOC) (Left) as a surgical intervention .  The patient's history has been reviewed, patient examined, no change in status, stable for surgery.  I have reviewed the patient's chart and labs.  Questions were answered to the patient's satisfaction.     Mahin Guardia

## 2016-04-12 NOTE — Discharge Instructions (Signed)
AMBULATORY SURGERY  DISCHARGE INSTRUCTIONS   1) The drugs that you were given will stay in your system until tomorrow so for the next 24 hours you should not:  A) Drive an automobile B) Make any legal decisions C) Drink any alcoholic beverage   2) You may resume regular meals tomorrow.  Today it is better to start with liquids and gradually work up to solid foods.  You may eat anything you prefer, but it is better to start with liquids, then soup and crackers, and gradually work up to solid foods.   3) Please notify your doctor immediately if you have any unusual bleeding, trouble breathing, redness and pain at the surgery site, drainage, fever, or pain not relieved by medication.    4) Additional Instructions:        Please contact your physician with any problems or Same Day Surgery at [phone], Monday through Friday 6 am to 4 pm, or [place] at [facility] Main number at [phone]. Eye Surgery Discharge Instructions  Expect mild scratchy sensation or mild soreness. DO NOT RUB YOUR EYE!  The day of surgery:  Minimal physical activity, but bed rest is not required  No reading, computer work, or close hand work  No bending, lifting, or straining.  May watch TV  For 24 hours:  No driving, legal decisions, or alcoholic beverages  Safety precautions  Eat anything you prefer: It is better to start with liquids, then soup then solid foods.  _____ Eye patch should be worn until postoperative exam tomorrow.  ____ Solar shield eyeglasses should be worn for comfort in the sunlight/patch while sleeping  Resume all regular medications including aspirin or Coumadin if these were discontinued prior to surgery. You may shower, bathe, shave, or wash your hair. Tylenol may be taken for mild discomfort.  Call your doctor if you experience significant pain, nausea, or vomiting, fever > 101 or other signs of infection. [phone] or [phone] Specific  instructions:  Follow-up Information    Follow up with Sallee LangeINGELDEIN,Rohin Krejci, MD.   Specialty:  Ophthalmology   Why:  follow up 5/23 at 1045   Contact information:   [address]   [place] [id number] [phone]

## 2016-04-12 NOTE — Anesthesia Postprocedure Evaluation (Signed)
Anesthesia Post Note  Patient: Sierra ResidesPatricia A Lloyd  Procedure(s) Performed: Procedure(s) (LRB): CATARACT EXTRACTION PHACO AND INTRAOCULAR LENS PLACEMENT (IOC) (Left)  Patient location during evaluation: PACU Anesthesia Type: MAC Level of consciousness: awake and alert Pain management: pain level controlled Vital Signs Assessment: post-procedure vital signs reviewed and stable Respiratory status: respiratory function stable Cardiovascular status: stable Postop Assessment: no headache, no backache and no signs of nausea or vomiting Anesthetic complications: no    Last Vitals:  Filed Vitals:   04/07/16 1349 04/12/16 0738  BP: 138/80 196/83  Pulse: 59 59  Temp:  37 C  Resp:  20    Last Pain: There were no vitals filed for this visit.               Michaele OfferSavage,  Leonetta Mcgivern A

## 2016-04-12 NOTE — Op Note (Deleted)
Date of Surgery: 04/12/2016 Date of Dictation: 04/12/2016 9:28 AM Pre-operative Diagnosis:  Nuclear Sclerotic Cataract right Eye Post-operative Diagnosis: same Procedure performed: Extra-capsular Cataract Extraction (ECCE) with placement of a posterior chamber intraocular lens (IOL) right Eye IOL:  Implant Name Type Inv. Item Serial No. Manufacturer Lot No. LRB No. Used  LENS IOL ACRYSOF IQ 22.0 - [id number] Intraocular Lens LENS IOL ACRYSOF IQ 22.0 [id number] 035 ALCON [id number] Left 1   Anesthesia: 2% Lidocaine and 4% Marcaine in a 50/50 mixture with 10 unites/ml of Hylenex given as a peribulbar Anesthesiologist: Anesthesiologist: Naomie DeanWilliam K Kephart, MD CRNA: Irving BurtonJennifer Bachich, CRNA; Michaele OfferKasey Savage, CRNA Complications: none Estimated Blood Loss: less than 1 ml  Description of procedure:  The patient was given anesthesia and sedation via intravenous access. The patient was then prepped and draped in the usual fashion. A 25-gauge needle was bent for initiating the capsulorhexis. A 5-0 silk suture was placed through the conjunctiva superior and inferiorly to serve as bridle sutures. Hemostasis was obtained at the superior limbus using an eraser cautery. A partial thickness groove was made at the anterior surgical limbus with a 64 Beaver blade and this was dissected anteriorly with an SYSCOlcon Crescent knife. The anterior chamber was entered at 10 o'clock with a 1.0 mm paracentesis knife and through the lamellar dissection with a 2.6 mm Alcon keratome. Epi-Shugarcaine 0.5 CC [9 cc BSS Plus (Alcon), 3 cc 4% preservative-free lidocaine (Hospira) and 4 cc 1:1000 preservative-free, bisulfite-free epinephrine] was injected into the anterior chamber via the paracentesis tract. Epi-Shugarcaine 0.5 CC [9 cc BSS Plus (Alcon), 3 cc 4% preservative-free lidocaine (Hospira) and 4 cc 1:1000 preservative-free, bisulfite-free epinephrine] was injected into the anterior chamber via the paracentesis tract. DiscoVisc was  injected to replace the aqueous and a continuous tear curvilinear capsulorhexis was performed using a bent 25-gauge needle.  Balance salt on a syringe was used to perform hydro-dissection and phacoemulsification was carried out using a divide and conquer technique. Procedure(s) with comments: CATARACT EXTRACTION PHACO AND INTRAOCULAR LENS PLACEMENT (IOC) (Left) - US  02:06 AP% 26.8 CDE 55.05 fluid pack lot # [id number]. Irrigation/aspiration was used to remove the residual cortex and the capsular bag was inflated with DiscoVisc. The intraocular lens was inserted into the capsular bag using a pre-loaded UltraSert Delivery System. Irrigation/aspiration was used to remove the residual DiscoVisc. The wound was inflated with balanced salt and checked for leaks. None were found. Miostat was injected via the paracentesis track and 0.1 ml of cefuroxime containing 1 mg of drug  was injected via the paracentesis track. The wound was checked for leaks again and none were found.   The bridal sutures were removed and two drops of Vigamox were placed on the eye. An eye shield was placed to protect the eye and the patient was discharged to the recovery area in good condition.   Verdelle Valtierra MD

## 2016-04-12 NOTE — Transfer of Care (Signed)
Immediate Anesthesia Transfer of Care Note  Patient: Sierra Lloyd  Procedure(s) Performed: Procedure(s) with comments: CATARACT EXTRACTION PHACO AND INTRAOCULAR LENS PLACEMENT (IOC) (Left) - US  02:06 AP% 26.8 CDE 55.05 fluid pack lot # [id number]  Patient Location: PACU  Anesthesia Type:MAC  Level of Consciousness: awake, alert , oriented and patient cooperative  Airway & Oxygen Therapy: Patient Spontanous Breathing  Post-op Assessment: Report given to RN, Post -op Vital signs reviewed and stable and Patient moving all extremities X 4  Post vital signs: Reviewed and stable  Last Vitals:  Filed Vitals:   04/07/16 1349 04/12/16 0738  BP: 138/80 196/83  Pulse: 59 59  Temp:  37 C  Resp:  20    Last Pain: There were no vitals filed for this visit.    Patients Stated Pain Goal: 0 (04/12/16 [id number])  Complications: No apparent anesthesia complications

## 2016-04-15 ENCOUNTER — Other Ambulatory Visit: Payer: Self-pay | Admitting: Family Medicine

## 2016-04-15 DIAGNOSIS — I10 Essential (primary) hypertension: Secondary | ICD-10-CM

## 2016-05-13 ENCOUNTER — Other Ambulatory Visit: Payer: Self-pay | Admitting: Family Medicine

## 2016-05-13 DIAGNOSIS — F411 Generalized anxiety disorder: Secondary | ICD-10-CM

## 2016-05-13 MED ORDER — ALPRAZOLAM 0.25 MG PO TABS: ORAL_TABLET | ORAL | Status: DC

## 2016-05-13 NOTE — Telephone Encounter (Signed)
Printed, please fax or call in to pharmacy. Thank you.   

## 2016-05-13 NOTE — Telephone Encounter (Signed)
Pharmacy called saying they need a new Rx for her Xanax.  The name brand has gone up in price so they need a new rx for the generic.  Please call back at [phone]  Thanks Barth Kirkseri

## 2016-06-08 ENCOUNTER — Ambulatory Visit (INDEPENDENT_AMBULATORY_CARE_PROVIDER_SITE_OTHER): Payer: Medicare Other | Admitting: Physician Assistant

## 2016-06-08 ENCOUNTER — Other Ambulatory Visit: Payer: Self-pay | Admitting: Family Medicine

## 2016-06-08 ENCOUNTER — Encounter: Payer: Self-pay | Admitting: Physician Assistant

## 2016-06-08 VITALS — BP 170/88 | HR 59 | Temp 98.4°F | Resp 16 | Wt 126.0 lb

## 2016-06-08 DIAGNOSIS — M5030 Other cervical disc degeneration, unspecified cervical region: Secondary | ICD-10-CM | POA: Diagnosis not present

## 2016-06-08 DIAGNOSIS — G5690 Unspecified mononeuropathy of unspecified upper limb: Secondary | ICD-10-CM

## 2016-06-08 DIAGNOSIS — E119 Type 2 diabetes mellitus without complications: Secondary | ICD-10-CM | POA: Diagnosis not present

## 2016-06-08 DIAGNOSIS — I10 Essential (primary) hypertension: Secondary | ICD-10-CM

## 2016-06-08 LAB — POCT GLYCOSYLATED HEMOGLOBIN (HGB A1C)
ESTIMATED AVERAGE GLUCOSE: 143
HEMOGLOBIN A1C: 6.6

## 2016-06-08 MED ORDER — GABAPENTIN 100 MG PO CAPS: 100.0000 mg | ORAL_CAPSULE | Freq: Three times a day (TID) | ORAL | Status: DC

## 2016-06-08 NOTE — Progress Notes (Signed)
Patient: Sierra Lloyd Female    DOB: 1934-09-12   82 y.o.   MRN: [id number] Visit Date: 06/08/2016  Today's Provider: Margaretann Loveless, PA-C   Chief Complaint  Patient presents with  . Follow-up    Diabetes and HTN   Subjective:    HPI  Diabetes Mellitus Type II, Follow-up:   Lab Results  Component Value Date   HGBA1C 6.6 06/08/2016   HGBA1C 6.8 02/25/2016   HGBA1C 7.0 11/18/2015   Last seen for diabetes 3 months ago.  Management since then includes None. She reports excellent compliance with treatment. She is not having side effects.  Current symptoms include none and have been stable. Home blood sugar records: fasting range: 120's-130's  Episodes of hypoglycemia? no   Most Recent Eye Exam: 03/2016 she had cataract surgery. She has a follow-up appointment this Friday. Weight trend: stable Current diet: in general, a "healthy" diet    ------------------------------------------------------------------------   Hypertension, follow-up:  BP Readings from Last 3 Encounters:  06/08/16 170/88  04/12/16 179/79  02/25/16 138/84    She was last seen for hypertension 3 months ago.  BP at that visit was 138/84. Management since that visit includes None.She reports excellent compliance with treatment. She is not having side effects.  She is not exercising. She is adherent to low salt diet.   Outside blood pressures are n/a. She is experiencing none.  Patient denies chest pressure/discomfort, exertional chest pressure/discomfort, fatigue, irregular heart beat, lower extremity edema and palpitations.   Cardiovascular risk factors include advanced age (older than 76 for men, 66 for women), diabetes mellitus and hypertension.     ------------------------------------------------------------------------     Allergies  Allergen Reactions  . Tetracycline     Roof of mouth broke out.   Current Meds  Medication Sig  . ALPRAZolam (XANAX) 0.25 MG tablet TAKE  1/2 OR 1 TABLET BY MOUTH TWICE DAILY AS NEEDED.  Marland Kitchen aspirin 81 MG tablet Take 81 mg by mouth daily.  . bisoprolol-hydrochlorothiazide (ZIAC) 10-6.25 MG tablet TAKE 1 TABLET BY MOUTH ONCE DAILY.  Marland Kitchen Boswellia-Glucosamine-Vit D (GLUCOSAMINE COMPLEX PO) Take by mouth daily as needed. 1500  . Bromfenac Sodium (BROMSITE) 0.075 % SOLN Apply 1 drop to eye.  . cholecalciferol (VITAMIN D) 1000 UNITS tablet Take 1,000 Units by mouth.   . Difluprednate (DUREZOL) 0.05 % EMUL Apply 1 drop to eye 2 (two) times daily. OS  . etodolac (LODINE) 500 MG tablet TAKE 1 TABLET BY MOUTH TWICE DAILY AS NEEDED FOR PAIN.  . glucose blood test strip Use to test blood sugar once a day.  DX: E11.9.  . metFORMIN (GLUCOPHAGE) 500 MG tablet TAKE 1 TABLET BY MOUTH TWICE DAILY.  Marland Kitchen OMEGA-3 FATTY ACIDS PO Take by mouth.   . vitamin B-12 (CYANOCOBALAMIN) 1000 MCG tablet Take 1 tablet (1,000 mcg total) by mouth daily.  Marland Kitchen VITAMIN E-1000 PO Take by mouth.     Review of Systems  Constitutional: Negative.   Respiratory: Negative.   Cardiovascular: Negative for chest pain, palpitations and leg swelling.  Gastrointestinal: Negative.   Musculoskeletal: Positive for back pain, neck pain and neck stiffness.  Neurological: Positive for numbness. Negative for dizziness, weakness, light-headedness and headaches.    Social History  Substance Use Topics  . Smoking status: Never Smoker   . Smokeless tobacco: Never Used  . Alcohol Use: Yes     Comment: occasional   Objective:   BP 170/88 mmHg  Pulse 59  Temp(Src) 98.4  F (36.9 C) (Oral)  Resp 16  Wt 126 lb (57.153 kg)  Physical Exam  Constitutional: She appears well-developed and well-nourished. No distress.  Neck: Normal range of motion. Neck supple. No JVD present. No tracheal deviation present. No thyromegaly present.  Cardiovascular: Normal rate, regular rhythm and normal heart sounds.  Exam reveals no gallop and no friction rub.   No murmur heard. Pulmonary/Chest: Effort  normal and breath sounds normal. No respiratory distress. She has no wheezes. She has no rales.  Musculoskeletal: She exhibits no edema.       Cervical back: She exhibits decreased range of motion and tenderness. She exhibits no bony tenderness.       Right hand: She exhibits normal range of motion, no tenderness and normal two-point discrimination. Normal sensation noted. Normal strength noted.       Left hand: She exhibits normal range of motion, no tenderness and normal two-point discrimination. Normal sensation noted. Normal strength noted.  Complains of neck pain (has known DDD and arthritis from xrays with chiropractor); Now with tingling and burning (pins and needles) into arms and hands bilaterally. No loss of grip strength yet.  Lymphadenopathy:    She has no cervical adenopathy.  Skin: She is not diaphoretic.  Vitals reviewed.     Assessment & Plan:     1. Essential hypertension Elevated BP today but has been normal at home. Most likely associated with anxiety of meeting a new provider with her personal history. She will check her BP at home and call if still elevated.  2. Type 2 diabetes mellitus without complication, without long-term current use of insulin (HCC) She continues to do very well with lifestyle modification and med compliance. Continue current medical treatment plan and will recheck HgBa1c in 3 months. - POCT glycosylated hemoglobin (Hb A1C)  3. DDD (degenerative disc disease), cervical Will add gabapentin as below to see if this helps relieve neuropathic pain. She is to call if she has any adverse reaction to the medication. I will see her back in 3 months to see if she has noticed any changes. - gabapentin (NEURONTIN) 100 MG capsule; Take 1 capsule (100 mg total) by mouth 3 (three) times daily.  Dispense: 90 capsule; Refill: 3  4. Neuropathy of upper extremity, unspecified laterality See above medical treatment plan. - gabapentin (NEURONTIN) 100 MG capsule; Take  1 capsule (100 mg total) by mouth 3 (three) times daily.  Dispense: 90 capsule; Refill: 3       Margaretann LovelessJennifer M Neiko Trivedi, PA-C  [facility]

## 2016-06-08 NOTE — Patient Instructions (Signed)
Gabapentin capsules or tablets What is this medicine? GABAPENTIN (GA ba pen tin) is used to control partial seizures in adults with epilepsy. It is also used to treat certain types of nerve pain. This medicine may be used for other purposes; ask your health care provider or pharmacist if you have questions. What should I tell my health care provider before I take this medicine? They need to know if you have any of these conditions: -kidney disease -suicidal thoughts, plans, or attempt; a previous suicide attempt by you or a family member -an unusual or allergic reaction to gabapentin, other medicines, foods, dyes, or preservatives -pregnant or trying to get pregnant -breast-feeding How should I use this medicine? Take this medicine by mouth with a glass of water. Follow the directions on the prescription label. You can take it with or without food. If it upsets your stomach, take it with food.Take your medicine at regular intervals. Do not take it more often than directed. Do not stop taking except on your doctor's advice. If you are directed to break the 600 or 800 mg tablets in half as part of your dose, the extra half tablet should be used for the next dose. If you have not used the extra half tablet within 28 days, it should be thrown away. A special MedGuide will be given to you by the pharmacist with each prescription and refill. Be sure to read this information carefully each time. Talk to your pediatrician regarding the use of this medicine in children. Special care may be needed. Overdosage: If you think you have taken too much of this medicine contact a poison control center or emergency room at once. NOTE: This medicine is only for you. Do not share this medicine with others. What if I miss a dose? If you miss a dose, take it as soon as you can. If it is almost time for your next dose, take only that dose. Do not take double or extra doses. What may interact with this medicine? Do not  take this medicine with any of the following medications: -other gabapentin products This medicine may also interact with the following medications: -alcohol -antacids -antihistamines for allergy, cough and cold -certain medicines for anxiety or sleep -certain medicines for depression or psychotic disturbances -homatropine; hydrocodone -naproxen -narcotic medicines (opiates) for pain -phenothiazines like chlorpromazine, mesoridazine, prochlorperazine, thioridazine This list may not describe all possible interactions. Give your health care provider a list of all the medicines, herbs, non-prescription drugs, or dietary supplements you use. Also tell them if you smoke, drink alcohol, or use illegal drugs. Some items may interact with your medicine. What should I watch for while using this medicine? Visit your doctor or health care professional for regular checks on your progress. You may want to keep a record at home of how you feel your condition is responding to treatment. You may want to share this information with your doctor or health care professional at each visit. You should contact your doctor or health care professional if your seizures get worse or if you have any new types of seizures. Do not stop taking this medicine or any of your seizure medicines unless instructed by your doctor or health care professional. Stopping your medicine suddenly can increase your seizures or their severity. Wear a medical identification bracelet or chain if you are taking this medicine for seizures, and carry a card that lists all your medications. You may get drowsy, dizzy, or have blurred vision. Do not drive, use   machinery, or do anything that needs mental alertness until you know how this medicine affects you. To reduce dizzy or fainting spells, do not sit or stand up quickly, especially if you are an older patient. Alcohol can increase drowsiness and dizziness. Avoid alcoholic drinks. Your mouth may get  dry. Chewing sugarless gum or sucking hard candy, and drinking plenty of water will help. The use of this medicine may increase the chance of suicidal thoughts or actions. Pay special attention to how you are responding while on this medicine. Any worsening of mood, or thoughts of suicide or dying should be reported to your health care professional right away. Women who become pregnant while using this medicine may enroll in the [place] Antiepileptic Drug Pregnancy Registry by calling 1-888-233-2334. This registry collects information about the safety of antiepileptic drug use during pregnancy. What side effects may I notice from receiving this medicine? Side effects that you should report to your doctor or health care professional as soon as possible: -allergic reactions like skin rash, itching or hives, swelling of the face, lips, or tongue -worsening of mood, thoughts or actions of suicide or dying Side effects that usually do not require medical attention (report to your doctor or health care professional if they continue or are bothersome): -constipation -difficulty walking or controlling muscle movements -dizziness -nausea -slurred speech -tiredness -tremors -weight gain This list may not describe all possible side effects. Call your doctor for medical advice about side effects. You may report side effects to FDA at 1-800-FDA-1088. Where should I keep my medicine? Keep out of reach of children. This medicine may cause accidental overdose and death if it taken by other adults, children, or pets. Mix any unused medicine with a substance like cat litter or coffee grounds. Then throw the medicine away in a sealed container like a sealed bag or a coffee can with a lid. Do not use the medicine after the expiration date. Store at room temperature between 15 and 30 degrees C (59 and 86 degrees F). NOTE: This sheet is a summary. It may not cover all possible information. If you have  questions about this medicine, talk to your doctor, pharmacist, or health care provider.    2016, Elsevier/Gold Standard. (2014-01-04 15:26:50)  

## 2016-06-10 NOTE — Telephone Encounter (Signed)
Pt contacted office for refill request on the following medications:  metFORMIN (GLUCOPHAGE) 500 MG tablet.  [facility].  CB#[id number]  Pt states she is almost out

## 2016-06-16 ENCOUNTER — Ambulatory Visit: Payer: Medicare Other | Admitting: Physician Assistant

## 2016-09-07 ENCOUNTER — Encounter: Payer: Self-pay | Admitting: Physician Assistant

## 2016-09-07 ENCOUNTER — Ambulatory Visit (INDEPENDENT_AMBULATORY_CARE_PROVIDER_SITE_OTHER): Payer: Medicare Other | Admitting: Physician Assistant

## 2016-09-07 VITALS — BP 146/86 | HR 60 | Temp 97.8°F | Resp 16 | Ht <= 58 in | Wt 127.0 lb

## 2016-09-07 DIAGNOSIS — E119 Type 2 diabetes mellitus without complications: Secondary | ICD-10-CM

## 2016-09-07 DIAGNOSIS — I10 Essential (primary) hypertension: Secondary | ICD-10-CM | POA: Diagnosis not present

## 2016-09-07 LAB — POCT GLYCOSYLATED HEMOGLOBIN (HGB A1C)
Est. average glucose Bld gHb Est-mCnc: 146
Hemoglobin A1C: 6.7

## 2016-09-07 NOTE — Patient Instructions (Signed)

## 2016-09-07 NOTE — Progress Notes (Signed)
Patient: Sierra Lloyd Female    DOB: 11-Jan-1934   82 y.o.   MRN: [id number] Visit Date: 09/08/2016  Today's Provider: Margaretann Loveless, PA-C   Chief Complaint  Patient presents with  . Diabetes  . Hypertension   Subjective:    HPI  Diabetes Mellitus Type II, Follow-up:   Lab Results  Component Value Date   HGBA1C 6.7 09/07/2016   HGBA1C 6.6 06/08/2016   HGBA1C 6.8 02/25/2016    Last seen for diabetes 3 months ago.  Management since then includes no changes. She reports excellent compliance with treatment. She is not having side effects.  Current symptoms include paresthesia of the feet and have been stable. Home blood sugar records: fasting range: 120  Episodes of hypoglycemia? no   Current Insulin Regimen:  Most Recent Eye Exam: UTD Weight trend: stable Prior visit with dietician: no Current diet: in general, a "healthy" diet   Current exercise: walking Pertinent Labs: No results found for: CHOL, TRIG, HDL, LDLCALC, CREATININE  Wt Readings from Last 3 Encounters:  09/07/16 127 lb (57.6 kg)  06/08/16 126 lb (57.2 kg)  04/12/16 126 lb (57.2 kg)    ------------------------------------------------------------------------   Hypertension, follow-up:  BP Readings from Last 3 Encounters:  09/07/16 (!) 146/86  06/08/16 (!) 170/88  04/12/16 (!) 179/79    She was last seen for hypertension 3 months ago.  BP at that visit was no changes. Management changes since that visit include no changes. She reports excellent compliance with treatment. She is not having side effects.  She is exercising. She is adherent to low salt diet.   Outside blood pressures are stable. She is experiencing none.  Patient denies chest pain.   Cardiovascular risk factors include advanced age (older than 35 for men, 59 for women), diabetes mellitus and hypertension.  Use of agents associated with hypertension: none.     Weight trend: stable Wt Readings from Last 3  Encounters:  09/07/16 127 lb (57.6 kg)  06/08/16 126 lb (57.2 kg)  04/12/16 126 lb (57.2 kg)    Current diet: in general, a "healthy" diet    ------------------------------------------------------------------------      Allergies  Allergen Reactions  . Tetracycline     Roof of mouth broke out.     Current Outpatient Prescriptions:  .  ALPRAZolam (XANAX) 0.25 MG tablet, TAKE 1/2 OR 1 TABLET BY MOUTH TWICE DAILY AS NEEDED., Disp: 60 tablet, Rfl: 5 .  aspirin 81 MG tablet, Take 81 mg by mouth daily., Disp: , Rfl:  .  bisoprolol-hydrochlorothiazide (ZIAC) 10-6.25 MG tablet, TAKE 1 TABLET BY MOUTH ONCE DAILY., Disp: 90 tablet, Rfl: 1 .  Boswellia-Glucosamine-Vit D (GLUCOSAMINE COMPLEX PO), Take by mouth daily as needed. 1500, Disp: , Rfl:  .  cholecalciferol (VITAMIN D) 1000 UNITS tablet, Take 1,000 Units by mouth. , Disp: , Rfl:  .  etodolac (LODINE) 500 MG tablet, TAKE 1 TABLET BY MOUTH TWICE DAILY AS NEEDED FOR PAIN., Disp: 60 tablet, Rfl: 5 .  gabapentin (NEURONTIN) 100 MG capsule, Take 1 capsule (100 mg total) by mouth 3 (three) times daily., Disp: 90 capsule, Rfl: 3 .  glucose blood test strip, Use to test blood sugar once a day.  DX: E11.9., Disp: 100 each, Rfl: 3 .  metFORMIN (GLUCOPHAGE) 500 MG tablet, TAKE 1 TABLET BY MOUTH TWICE DAILY., Disp: 180 tablet, Rfl: 3 .  OMEGA-3 FATTY ACIDS PO, Take by mouth. , Disp: , Rfl:  .  vitamin B-12 (  CYANOCOBALAMIN) 1000 MCG tablet, Take 1 tablet (1,000 mcg total) by mouth daily., Disp: 1 tablet, Rfl: 0 .  VITAMIN E-1000 PO, Take by mouth. , Disp: , Rfl:   Review of Systems  Constitutional: Negative.   Respiratory: Negative.   Cardiovascular: Negative.   Gastrointestinal: Negative.   Musculoskeletal: Negative.   Neurological: Positive for numbness.  Psychiatric/Behavioral: The patient is nervous/anxious.     Social History  Substance Use Topics  . Smoking status: Never Smoker  . Smokeless tobacco: Never Used  . Alcohol use Yes       Comment: occasional   Objective:   BP (!) 146/86 (BP Location: Left Arm, Patient Position: Sitting, Cuff Size: Normal)   Pulse 60   Temp 97.8 F (36.6 C) (Oral)   Resp 16   Ht 4\' 8"  (1.422 m)   Wt 127 lb (57.6 kg)   BMI 28.47 kg/m   Physical Exam  Constitutional: She appears well-developed and well-nourished. No distress.  Neck: Normal range of motion and full passive range of motion without pain. Neck supple. No JVD present. No spinous process tenderness and no muscular tenderness present. No tracheal deviation and normal range of motion present. No thyromegaly present.  Cardiovascular: Normal rate, regular rhythm and normal heart sounds.  Exam reveals no gallop and no friction rub.   No murmur heard. Pulmonary/Chest: Effort normal and breath sounds normal. No respiratory distress. She has no wheezes. She has no rales.  Lymphadenopathy:    She has no cervical adenopathy.  Skin: She is not diaphoretic.  Psychiatric: Her speech is normal and behavior is normal. Judgment and thought content normal. Her mood appears anxious. Cognition and memory are normal.  Vitals reviewed.     Assessment & Plan:     1. Type 2 diabetes mellitus without complication, without long-term current use of insulin (HCC) A1c increased slightly. I will see her back in 3 months to recheck her A1c.  - POCT glycosylated hemoglobin (Hb A1C)  2. Essential hypertension Stable. At home readings are normal. I will see her back in 3 months to recheck.       Margaretann LovelessJennifer M Dewarren Ledbetter, PA-C  [facility]

## 2016-11-11 ENCOUNTER — Other Ambulatory Visit: Payer: Self-pay | Admitting: Family Medicine

## 2016-11-11 DIAGNOSIS — M1990 Unspecified osteoarthritis, unspecified site: Secondary | ICD-10-CM

## 2016-11-11 DIAGNOSIS — I10 Essential (primary) hypertension: Secondary | ICD-10-CM

## 2016-11-11 NOTE — Telephone Encounter (Signed)
Please review-aa 

## 2016-12-14 ENCOUNTER — Other Ambulatory Visit: Payer: Self-pay | Admitting: Family Medicine

## 2016-12-14 ENCOUNTER — Ambulatory Visit: Payer: Medicare Other | Admitting: Physician Assistant

## 2016-12-14 DIAGNOSIS — H2511 Age-related nuclear cataract, right eye: Secondary | ICD-10-CM | POA: Diagnosis not present

## 2016-12-14 DIAGNOSIS — F411 Generalized anxiety disorder: Secondary | ICD-10-CM

## 2016-12-24 ENCOUNTER — Encounter: Payer: Self-pay | Admitting: Physician Assistant

## 2016-12-27 ENCOUNTER — Ambulatory Visit (INDEPENDENT_AMBULATORY_CARE_PROVIDER_SITE_OTHER): Payer: Medicare Other | Admitting: Physician Assistant

## 2016-12-27 ENCOUNTER — Encounter: Payer: Self-pay | Admitting: Physician Assistant

## 2016-12-27 VITALS — BP 150/86 | HR 56 | Resp 16 | Wt 128.4 lb

## 2016-12-27 DIAGNOSIS — I10 Essential (primary) hypertension: Secondary | ICD-10-CM | POA: Diagnosis not present

## 2016-12-27 DIAGNOSIS — M7501 Adhesive capsulitis of right shoulder: Secondary | ICD-10-CM | POA: Diagnosis not present

## 2016-12-27 DIAGNOSIS — T300 Burn of unspecified body region, unspecified degree: Secondary | ICD-10-CM | POA: Diagnosis not present

## 2016-12-27 DIAGNOSIS — E119 Type 2 diabetes mellitus without complications: Secondary | ICD-10-CM

## 2016-12-27 LAB — POCT GLYCOSYLATED HEMOGLOBIN (HGB A1C)
ESTIMATED AVERAGE GLUCOSE: 148
Hemoglobin A1C: 6.8
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
## 2016-12-27 NOTE — Patient Instructions (Signed)
Type 2 Diabetes Mellitus, Self Care, Adult When you have type 2 diabetes (type 2 diabetes mellitus), you must keep your blood sugar (glucose) under control. You can do this with:  Nutrition.  Exercise.  Lifestyle changes.  Medicines or insulin, if needed.  Support from your doctors and others. How do I manage my blood sugar?  Check your blood sugar level every day, as often as told.  Call your doctor if your blood sugar is above your goal numbers for 2 tests in a row.  Have your A1c (hemoglobin A1c) level checked at least two times a year. Have it checked more often if your doctor tells you to. Your doctor will set treatment goals for you. Generally, you should have these blood sugar levels:  Before meals (preprandial): 80-130 mg/dL (4.4-7.2 mmol/L).  After meals (postprandial): lower than 180 mg/dL (10 mmol/L).  A1c level: less than 7%. What do I need to know about high blood sugar? High blood sugar is called hyperglycemia. Know the signs of high blood sugar. Signs may include:  Feeling:  Thirsty.  Hungry.  Very tired.  Needing to pee (urinate) more than usual.  Blurry vision. What do I need to know about low blood sugar? Low blood sugar is called hypoglycemia. This is when blood sugar is at or below 70 mg/dL (3.9 mmol/L). Symptoms may include:  Feeling:  Hungry.  Worried or nervous (anxious).  Sweaty and clammy.  Confused.  Dizzy.  Sleepy.  Sick to your stomach (nauseous).  Having:  A fast heartbeat (palpitations).  A headache.  A change in your vision.  Jerky movements that you cannot control (seizure).  Nightmares.  Tingling or no feeling (numbness) around the mouth, lips, or tongue.  Having trouble with:  Talking.  Paying attention (concentrating).  Moving (coordination).  Sleeping.  Shaking.  Passing out (fainting).  Getting upset easily (irritability). Treating low blood sugar  To treat low blood sugar, eat or drink  something sugary right away. If you can think clearly and swallow safely, follow the 15:15 rule:  Take 15 grams of a fast-acting carb (carbohydrate). Some fast-acting carbs are:  1 tube of glucose gel.  3 sugar tablets (glucose pills).  6-8 pieces of hard candy.  4 oz (120 mL) of fruit juice.  4 oz (120 mL) regular (not diet) soda.  Check your blood sugar 15 minutes after you take the carb.  If your blood sugar is still at or below 70 mg/dL (3.9 mmol/L), take 15 grams of a carb again.  If your blood sugar does not go above 70 mg/dL (3.9 mmol/L) after 3 tries, get help right away.  After your blood sugar goes back to normal, eat a meal or a snack within 1 hour. Treating very low blood sugar  If your blood sugar is at or below 54 mg/dL (3 mmol/L), you have very low blood sugar (severe hypoglycemia). This is an emergency. Do not wait to see if the symptoms will go away. Get medical help right away. Call your local emergency services (911 in the [place] Do not drive yourself to the hospital. If you have very low blood sugar and you cannot eat or drink, you may need a glucagon shot (injection). A family member or friend should learn how to check your blood sugar and how to give you a glucagon shot. Ask your doctor if you need to have a glucagon shot kit at home. What else is important to manage my diabetes? Medicine  Follow these instructions   about insulin and diabetes medicines:  Take them as told by your doctor.  Adjust them as told by your doctor.  Do not run out of them. Having diabetes can raise your risk for other long-term conditions. These include heart or kidney disease. Your doctor may prescribe medicines to help prevent problems from diabetes. Food   Make healthy food choices. These include:  Chicken, fish, egg whites, and beans.  Oats, whole wheat, bulgur, brown rice, quinoa, and millet.  Fresh fruits and vegetables.  Low-fat dairy products.  Nuts, avocado, olive  oil, and canola oil.  Make a food plan with a specialist (dietitian).  Follow instructions from your doctor about what you cannot eat or drink.  Drink enough fluid to keep your pee (urine) clear or pale yellow.  Eat healthy snacks between healthy meals.  Keep track of carbs that you eat. Read food labels. Learn food serving sizes.  Follow your sick day plan when you cannot eat or drink normally. Make this plan with your doctor so it is ready to use. Activity  Exercise at least 3 times a week.  Do not go more than 2 days without exercising.  Talk with your doctor before you start a new exercise. Your doctor may need to adjust your insulin, medicines, or food. Lifestyle   Do not use any tobacco products. These include cigarettes, chewing tobacco, and e-cigarettes.If you need help quitting, ask your doctor.  Ask your doctor how much alcohol is safe for you.  Learn to deal with stress. If you need help with this, ask your doctor. Body care  Stay up to date with your shots (immunizations).  Have your eyes and feet checked by a doctor as often as told.  Check your skin and feet every day. Check for cuts, bruises, redness, blisters, or sores.  Brush your teeth and gums two times a day.  Floss at least one time a day.  Go to the dentist least one time every 6 months.  Stay at a healthy weight. General instructions   Take over-the-counter and prescription medicines only as told by your doctor.  Share your diabetes care plan with:  Your work or school.  People you live with.  Check your pee (urine) for ketones:  When you are sick.  As told by your doctor.  Carry a card or wear jewelry that says that you have diabetes.  Ask your doctor:  Do I need to meet with a diabetes educator?  Where can I find a support group for people with diabetes?  Keep all follow-up visits as told by your doctor. This is important. Where to find more information: To learn more about  diabetes, visit:  American Diabetes Association: www.diabetes.org  American Association of Diabetes Educators: www.diabeteseducator.org/patient-resources This information is not intended to replace advice given to you by your health care provider. Make sure you discuss any questions you have with your health care provider. Document Released: 03/01/2016 Document Revised: 04/15/2016 Document Reviewed: 12/12/2015 Elsevier Interactive Patient Education  2017 Elsevier Inc[employer]  

## 2017-01-11 ENCOUNTER — Ambulatory Visit (INDEPENDENT_AMBULATORY_CARE_PROVIDER_SITE_OTHER): Payer: Medicare Other | Admitting: Physician Assistant

## 2017-01-11 VITALS — BP 152/88 | HR 60 | Temp 97.9°F | Resp 16 | Wt 127.0 lb

## 2017-01-11 DIAGNOSIS — L0889 Other specified local infections of the skin and subcutaneous tissue: Secondary | ICD-10-CM | POA: Diagnosis not present

## 2017-01-11 DIAGNOSIS — L309 Dermatitis, unspecified: Secondary | ICD-10-CM | POA: Diagnosis not present

## 2017-01-11 MED ORDER — MOMETASONE FUROATE 0.1 % EX CREA: 1.0000 "application " | TOPICAL_CREAM | Freq: Every day | CUTANEOUS | 0 refills | Status: DC

## 2017-01-11 MED ORDER — CEPHALEXIN 500 MG PO CAPS: 500.0000 mg | ORAL_CAPSULE | Freq: Two times a day (BID) | ORAL | 0 refills | Status: DC
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
## 2017-01-11 NOTE — Patient Instructions (Signed)
Cephalexin tablets or capsules What is this medicine? CEPHALEXIN (sef a LEX in) is a cephalosporin antibiotic. It is used to treat certain kinds of bacterial infections It will not work for colds, flu, or other viral infections. This medicine may be used for other purposes; ask your health care provider or pharmacist if you have questions. COMMON BRAND NAME(S): Biocef, Daxbia, Keflex, Keftab What should I tell my health care provider before I take this medicine? They need to know if you have any of these conditions: -kidney disease -stomach or intestine problems, especially colitis -an unusual or allergic reaction to cephalexin, other cephalosporins, penicillins, other antibiotics, medicines, foods, dyes or preservatives -pregnant or trying to get pregnant -breast-feeding How should I use this medicine? Take this medicine by mouth with a full glass of water. Follow the directions on the prescription label. This medicine can be taken with or without food. Take your medicine at regular intervals. Do not take your medicine more often than directed. Take all of your medicine as directed even if you think you are better. Do not skip doses or stop your medicine early. Talk to your pediatrician regarding the use of this medicine in children. While this drug may be prescribed for selected conditions, precautions do apply. Overdosage: If you think you have taken too much of this medicine contact a poison control center or emergency room at once. NOTE: This medicine is only for you. Do not share this medicine with others. What if I miss a dose? If you miss a dose, take it as soon as you can. If it is almost time for your next dose, take only that dose. Do not take double or extra doses. There should be at least 4 to 6 hours between doses. What may interact with this medicine? -probenecid -some other antibiotics This list may not describe all possible interactions. Give your health care provider a list of  all the medicines, herbs, non-prescription drugs, or dietary supplements you use. Also tell them if you smoke, drink alcohol, or use illegal drugs. Some items may interact with your medicine. What should I watch for while using this medicine? Tell your doctor or health care professional if your symptoms do not begin to improve in a few days. Do not treat diarrhea with over the counter products. Contact your doctor if you have diarrhea that lasts more than 2 days or if it is severe and watery. If you have diabetes, you may get a false-positive result for sugar in your urine. Check with your doctor or health care professional. What side effects may I notice from receiving this medicine? Side effects that you should report to your doctor or health care professional as soon as possible: -allergic reactions like skin rash, itching or hives, swelling of the face, lips, or tongue -breathing problems -pain or trouble passing urine -redness, blistering, peeling or loosening of the skin, including inside the mouth -severe or watery diarrhea -unusually weak or tired -yellowing of the eyes, skin Side effects that usually do not require medical attention (report to your doctor or health care professional if they continue or are bothersome): -gas or heartburn -genital or anal irritation -headache -joint or muscle pain -nausea, vomiting This list may not describe all possible side effects. Call your doctor for medical advice about side effects. You may report side effects to FDA at 1-800-FDA-1088. Where should I keep my medicine? Keep out of the reach of children. Store at room temperature between 59 and 86 degrees F (15 and  30 degrees C). Throw away any unused medicine after the expiration date. NOTE: This sheet is a summary. It may not cover all possible information. If you have questions about this medicine, talk to your doctor, pharmacist, or health care provider.  2017 Elsevier/Gold Standard  (2008-02-12 17:09:13) Mometasone skin cream, lotion, or ointment What is this medicine? MOMETASONE (moe MET a sone) is a corticosteroid. It is used to treat skin problems that may cause itching, redness, and swelling. This medicine may be used for other purposes; ask your health care provider or pharmacist if you have questions. COMMON BRAND NAME(S): Elocon What should I tell my health care provider before I take this medicine? They need to know if you have any of these conditions: -acne or rosacea -any type of active infection -large areas of burned or damaged skin -skin wasting or thinning -an unusual or allergic reaction to mometasone, steroids, other medicines, foods, dyes, or preservatives -pregnant or trying to get pregnant -breast-feeding How should I use this medicine? This medicine is for external use only. Do not take by mouth. Follow the directions on the prescription label. Wash your hands before and after use. Apply a thin film to the affected area and rub in gently. Do not bandage or wrap the skin being treated unless directed to do so by your doctor or health care professional. Do not use on healthy skin or over large areas of skin. Do not get this medicine in your eyes. If you do, rinse it out with plenty of cool tap water. Use your doses at regular intervals. Do not use your medicine more often than directed or for a longer period of time than ordered by your doctor or health care professional. To do so may increase the chance of side effects. Talk to your pediatrician regarding the use of this medicine in children. While this drug may be prescribed for children as young as 1 years of age for selected conditions, precautions do apply. Do not use this medicine on the diaper area of a child. Diapers or plastic pants are considered air tight bandages and may increase the amount of medicine that is absorbed and increase the risk of serious side effects. Elderly patients are more likely  to have damaged skin through aging, and this may increase side effects. This medicine should only be used for brief periods and infrequently in older patients. Overdosage: If you think you have taken too much of this medicine contact a poison control center or emergency room at once. NOTE: This medicine is only for you. Do not share this medicine with others. What if I miss a dose? If you miss a dose, use it as soon as you can. If it is almost time for your next dose, use only that dose. Do not use double or extra doses. What may interact with this medicine? Interactions are not expected. Do not use any other skin products without telling your doctor or health care professional. This list may not describe all possible interactions. Give your health care provider a list of all the medicines, herbs, non-prescription drugs, or dietary supplements you use. Also tell them if you smoke, drink alcohol, or use illegal drugs. Some items may interact with your medicine. What should I watch for while using this medicine? Tell your doctor or health care professional if your symptoms do not get better within 2 weeks. This medicine may increase your risk of getting an infection. Tell your doctor or health care professional if you are  around anyone with measles or chickenpox, or if you develop sores or blisters that do not heal properly. What side effects may I notice from receiving this medicine? Side effects that you should report to your doctor or health care professional as soon as possible: -painful, red, pus filled blisters in hair follicles -severe burning and continued itching of the skin -thinning of the skin with easy bruising Side effects that usually do not require medical attention (report to your doctor or health care professional if they continue or are bothersome): -burning, itching, or irritation of the skin -increased redness or scaling of the skin This list may not describe all possible side  effects. Call your doctor for medical advice about side effects. You may report side effects to FDA at 1-800-FDA-1088. Where should I keep my medicine? Keep out of the reach of children. Store at room temperature between 15 and 30 degrees C (59 and 86 degrees F) away from heat and direct light. Do not freeze. Throw away any unused medicine after the expiration date. NOTE: This sheet is a summary. It may not cover all possible information. If you have questions about this medicine, talk to your doctor, pharmacist, or health care provider.  2017 Elsevier/Gold Standard (2008-05-27 14:39:23) Atopic Dermatitis Atopic dermatitis is a skin disorder that causes inflammation of the skin. This is the most common type of eczema. Eczema is a group of skin conditions that cause the skin to be itchy, red, and swollen. This condition is generally worse during the cooler winter months and often improves during the warm summer months. Symptoms can vary from person to person. Atopic dermatitis usually starts showing signs in infancy and can last through adulthood. This condition cannot be passed from one person to another (non-contagious), but is more common in families. Atopic dermatitis may not always be present. When it is present, it is called a flare-up. What are the causes? The exact cause of this condition is not known. Flare-ups of the condition may be triggered by:  Contact with something you are sensitive or allergic to.  Stress.  Certain foods.  Extremely hot or cold weather.  Harsh chemicals and soaps.  Dry air.  Chlorine. What increases the risk? This condition is more likely to develop in people who have a personal history or family history of eczema, allergies, asthma, or hay fever. What are the signs or symptoms? Symptoms of this condition include:  Dry, scaly skin.  Red, itchy rash.  Itchiness, which can be severe. This may occur before the skin rash. This can make sleeping  difficult.  Skin thickening and cracking can occur over time. How is this diagnosed? This condition is diagnosed based on your symptoms, a medical history, and a physical exam. How is this treated? There is no cure for this condition, but symptoms can usually be controlled. Treatment focuses on:  Controlling the itching and scratching. You may be given medicines, such as antihistamines or steroid creams.  Limiting exposure to things that you are sensitive or allergic to (allergens).  Recognizing situations that cause stress and developing a plan to manage stress. If your atopic dermatitis does not get better with medicines or is all over your body (widespread) , a treatment using a specific type of light (phototherapy) may be used. Follow these instructions at home: Skin care  Keep your skin well-moisturized. This seals in moisture and help prevent dryness.  Use unscented lotions that have petroleum in them.  Avoid lotions that contain alcohol and water.  They can dry the skin.  Keep baths or showers short (less than 5 minutes) in warm water. Do not use hot water.  Use mild, unscented cleansers for bathing. Avoid soap and bubble bath.  Apply a moisturizer to your skin right after a bath or shower.   Do not apply anything to your skin without checking with your health care provider. General instructions  Dress in clothes made of cotton or cotton blends. Dress lightly because heat increases itching.  When washing your clothes, rinse your clothes twice so all of the soap is removed.  Avoid any triggers that can cause a flare-up.  Try to manage your stress.  Keep your fingernails cut short.  Avoid scratching. Scratching makes the rash and itching worse. It may also result in a skin infection (impetigo) due to a break in the skin caused by scratching.  Take or apply over-the-counter and prescription medicines only as told by your health care provider.  Keep all follow-up  visits as told by your health care provider. This is important.  Do not be around people who have cold sores or fever blisters. If you get the infection, it may cause your atopic dermatitis to worsen. Contact a health care provider if:  Your itching interferes with sleep.  Your rash gets worse or is not better within one week of starting treatment.  You have a fever.  You have a rash flare-up after having contact with someone who has cold sores or fever blisters. Get help right away if:  You develop pus or soft yellow scabs in the rash area. Summary  This condition causes a red rash and itchy, dry, scaly skin.  Treatment focuses on controlling the itching and scratching, limiting exposure to things that you are sensitive or allergic to (allergens), and recognizing situations that cause stress and developing a plan to manage stress.  Keep your skin well-moisturized.  Keep baths or showers less than 5 minutes. This information is not intended to replace advice given to you by your health care provider. Make sure you discuss any questions you have with your health care provider. Document Released: 11/05/2000 Document Revised: 04/15/2016 Document Reviewed: 06/11/2013 Elsevier Interactive Patient Education  2017 [employer].

## 2017-01-14 ENCOUNTER — Telehealth: Payer: Self-pay | Admitting: Physician Assistant

## 2017-01-14 NOTE — Telephone Encounter (Signed)
Patient called back and states that she is not interested in doing AWV with Nurse advisor. Patient advised this will be noted in her chart-aa

## 2017-01-14 NOTE — Telephone Encounter (Signed)
Called Pt to schedule AWV with [facility] - knb °

## 2017-01-25 ENCOUNTER — Ambulatory Visit: Payer: Medicare Other | Admitting: Physician Assistant

## 2017-01-26 ENCOUNTER — Ambulatory Visit (INDEPENDENT_AMBULATORY_CARE_PROVIDER_SITE_OTHER): Payer: Medicare Other | Admitting: Physician Assistant

## 2017-01-26 ENCOUNTER — Encounter: Payer: Self-pay | Admitting: Physician Assistant

## 2017-01-26 VITALS — BP 162/60 | HR 58 | Temp 98.2°F | Resp 16 | Wt 127.0 lb

## 2017-01-26 DIAGNOSIS — L304 Erythema intertrigo: Secondary | ICD-10-CM

## 2017-01-26 MED ORDER — TRIAMCINOLONE ACETONIDE 0.1 % EX CREA: 1.0000 "application " | TOPICAL_CREAM | Freq: Three times a day (TID) | CUTANEOUS | 0 refills | Status: DC

## 2017-01-26 MED ORDER — CLINDAMYCIN HCL 300 MG PO CAPS: 300.0000 mg | ORAL_CAPSULE | Freq: Three times a day (TID) | ORAL | 0 refills | Status: DC
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
## 2017-01-26 NOTE — Patient Instructions (Signed)
Hand Dermatitis Hand dermatitis is a skin condition that causes small, itchy, raised dots or fluid-filled blisters to form over the palms of the hands. This condition may also be called hand eczema. What are the causes? The cause of this condition is not known. What increases the risk? This condition is more likely to develop in people who have a history of allergies, such as:  Hay fever.  Allergic asthma.  An allergy to latex. Chemical exposure, injuries, and environmental irritants can make hand dermatitis worse. Washing your hands too often can remove natural oils, which can dry out the skin and contribute to outbreaks of this condition. What are the signs or symptoms? The most common symptom of this condition is intense itchiness. Cracks or grooves (fissures) on the fingers can also develop. Affected areas can be painful, especially areas where large blisters have formed. How is this diagnosed? This condition is diagnosed with a medical history and physical exam. How is this treated? This condition is treated with medicines, including:  Steroid creams and ointments.  Oral steroid medicines.  Antibiotic medicines. These are prescribed if you have an infection.  Antihistamine medicines. These help to reduce itchiness. Follow these instructions at home:  Take or apply over-the-counter and prescription medicines only as told by your health care provider.  If you were prescribed an antibiotic medicine, use it as told by your health care provider. Do not stop using the antibiotic even if you start to feel better.  Avoid washing your hands more often than necessary.  Avoid using harsh chemicals on your hands.  Wear protective gloves when you handle products that can irritate your skin.  Keep all follow-up visits as told by your health care provider. This is important. Contact a health care provider if:  Your rash does not improve during the first week of treatment.  Your rash  is red or tender.  Your rash has pus coming from it.  Your rash spreads. This information is not intended to replace advice given to you by your health care provider. Make sure you discuss any questions you have with your health care provider. Document Released: 11/08/2005 Document Revised: 04/15/2016 Document Reviewed: 05/23/2015 Elsevier Interactive Patient Education  2017 Else[employer] Inc.  

## 2017-02-02 ENCOUNTER — Ambulatory Visit (INDEPENDENT_AMBULATORY_CARE_PROVIDER_SITE_OTHER): Payer: Medicare Other | Admitting: Physician Assistant

## 2017-02-02 ENCOUNTER — Encounter: Payer: Self-pay | Admitting: Physician Assistant

## 2017-02-02 ENCOUNTER — Telehealth: Payer: Self-pay | Admitting: Physician Assistant

## 2017-02-02 DIAGNOSIS — L304 Erythema intertrigo: Secondary | ICD-10-CM

## 2017-02-02 MED ORDER — CLINDAMYCIN HCL 300 MG PO CAPS
300.0000 mg | ORAL_CAPSULE | Freq: Two times a day (BID) | ORAL | 0 refills | Status: DC
Start: 2017-02-02 — End: 2017-02-14

## 2017-02-02 MED ORDER — TRIAMCINOLONE ACETONIDE 0.1 % EX CREA: 1.0000 "application " | TOPICAL_CREAM | Freq: Three times a day (TID) | CUTANEOUS | 0 refills | Status: DC

## 2017-02-02 NOTE — Telephone Encounter (Signed)
Pt states she is having a problem with one of her medications.  CB#[id number]

## 2017-02-02 NOTE — Patient Instructions (Signed)
Hand Dermatitis Hand dermatitis is a skin condition. It causes small, itchy, raised dots or fluid-filled blisters to form on the palms of the hands. This condition may also be called hand eczema. Follow these instructions at home:  Take or apply over-the-counter and prescription medicines only as told by your doctor.  If you were prescribed an antibiotic medicine, use it as told by your doctor. Do not stop using the antibiotic even if you start to feel better.  Avoid washing your hands more often than you need to.  Avoid using harsh chemicals on your hands.  Wear gloves that protect your hands when you handle products that can bother (irritate) your skin.  Keep all follow-up visits as told by your doctor. This is important. Contact a doctor if:  Your rash is not better after one week of treatment.  Your rash is red.  Your rash is tender.  Your rash has pus coming from it.  Your rash spreads. This information is not intended to replace advice given to you by your health care provider. Make sure you discuss any questions you have with your health care provider. Document Released: 02/02/2010 Document Revised: 04/15/2016 Document Reviewed: 05/23/2015 Elsevier Interactive Patient Education  2017 Elsevier Inc.  

## 2017-02-02 NOTE — Progress Notes (Signed)
Patient: Sierra Lloyd Female    DOB: 04/29/1934   83 y.o.   MRN: [id number] Visit Date: 02/02/2017  Today's Provider: Margaretann LovelessJennifer M Hortense Cantrall, PA-C   Chief Complaint  Patient presents with  . Follow-up   Subjective:    HPI  Follow up for rash on hands  The patient was last seen for this 1 weeks ago. Changes made at last visit include start triamcinolone and clindamycin.  She reports excellent compliance with treatment. She feels that condition is Improved. She is not having side effects.   ------------------------------------------------------------------------------------ Upper Respiratory Infection: Patient complains of symptoms of a URI. Symptoms include congestion and cough. Onset of symptoms was 3 days ago, unchanged since that time. She also c/o congestion and non productive cough for the past 3 days .  She is drinking plenty of fluids. Evaluation to date: none. Treatment to date: none.     Allergies  Allergen Reactions  . Tetracycline     Roof of mouth broke out.     Current Outpatient Prescriptions:  .  ALPRAZolam (XANAX) 0.25 MG tablet, TAKE 1/2 TO 1 TABLET BY MOUTH TWICE DAILY AS NEEDED, Disp: 60 tablet, Rfl: 5 .  aspirin 81 MG tablet, Take 81 mg by mouth daily., Disp: , Rfl:  .  bisoprolol-hydrochlorothiazide (ZIAC) 10-6.25 MG tablet, TAKE 1 TABLET BY MOUTH ONCE DAILY, Disp: 90 tablet, Rfl: 1 .  Boswellia-Glucosamine-Vit D (GLUCOSAMINE COMPLEX PO), Take by mouth daily as needed. 1500, Disp: , Rfl:  .  cholecalciferol (VITAMIN D) 1000 UNITS tablet, Take 1,000 Units by mouth. , Disp: , Rfl:  .  clindamycin (CLEOCIN) 300 MG capsule, Take 1 capsule (300 mg total) by mouth 3 (three) times daily., Disp: 30 capsule, Rfl: 0 .  etodolac (LODINE) 500 MG tablet, TAKE 1 TABLET BY MOUTH TWICE DAILY AS NEEDED FOR PAIN., Disp: 180 tablet, Rfl: 1 .  gabapentin (NEURONTIN) 100 MG capsule, Take 1 capsule (100 mg total) by mouth 3 (three) times daily., Disp: 90 capsule, Rfl:  3 .  glucose blood test strip, Use to test blood sugar once a day.  DX: E11.9., Disp: 100 each, Rfl: 3 .  metFORMIN (GLUCOPHAGE) 500 MG tablet, TAKE 1 TABLET BY MOUTH TWICE DAILY., Disp: 180 tablet, Rfl: 3 .  OMEGA-3 FATTY ACIDS PO, Take by mouth. , Disp: , Rfl:  .  triamcinolone cream (KENALOG) 0.1 %, Apply 1 application topically 3 (three) times daily., Disp: 80 g, Rfl: 0 .  vitamin B-12 (CYANOCOBALAMIN) 1000 MCG tablet, Take 1 tablet (1,000 mcg total) by mouth daily., Disp: 1 tablet, Rfl: 0 .  VITAMIN E-1000 PO, Take by mouth. , Disp: , Rfl:   Review of Systems  Constitutional: Positive for fatigue.  HENT: Positive for congestion.   Respiratory: Positive for cough.   Cardiovascular: Negative.   Gastrointestinal: Negative.   Skin: Positive for rash.    Social History  Substance Use Topics  . Smoking status: Never Smoker  . Smokeless tobacco: Never Used  . Alcohol use Yes     Comment: occasional   Objective:   BP 140/80 (BP Location: Left Arm, Patient Position: Sitting, Cuff Size: Normal)   Pulse 60   Temp 98.5 F (36.9 C) (Oral)   Resp 16   Wt 126 lb (57.2 kg)   SpO2 98%   BMI 28.25 kg/m  Vitals:   02/02/17 1143  BP: 140/80  Pulse: 60  Resp: 16  Temp: 98.5 F (36.9 C)  TempSrc: Oral  SpO2: 98%  Weight: 126 lb (57.2 kg)     Physical Exam  Constitutional: She appears well-developed and well-nourished. No distress.  Neck: Normal range of motion. Neck supple.  Cardiovascular: Normal rate, regular rhythm and normal heart sounds.  Exam reveals no gallop and no friction rub.   No murmur heard. Pulmonary/Chest: Effort normal and breath sounds normal. No respiratory distress. She has no wheezes. She has no rales.  Skin: She is not diaphoretic.  She continues to have a diffuse papular rash noted on the hands. None are draining currently and swelling has improved. There is a small patch now surrounding the left eye as well but not affecting the eye or vision.  Vitals  reviewed.     Assessment & Plan:     1. Eczema intertrigo Improving. She does continue to put her hands in water to wash dishes and clorox, but states she is wearing rubber gloves when she does this. Will continue clindamycin and triamcinolone as prescribed. I will see her back in 2 weeks. She is to call if symptoms worsen in the meantime.  - clindamycin (CLEOCIN) 300 MG capsule; Take 1 capsule (300 mg total) by mouth 2 (two) times daily.  Dispense: 20 capsule; Refill: 0 - triamcinolone cream (KENALOG) 0.1 %; Apply 1 application topically 3 (three) times daily.  Dispense: 80 g; Refill: 0       Margaretann Loveless, PA-C  [facility]

## 2017-02-02 NOTE — Telephone Encounter (Signed)
Patient reports that she only wants to talk to ConcowJenni. Per Antony ContrasJenni she will call patient this afternoon.  Thanks,  -Joseline

## 2017-02-02 NOTE — Telephone Encounter (Signed)
Patient had question about the quantity of the antibiotic. I had switched her to twice daily instead of TID and she had misunderstood in the OV. This was explained and all questions were answered.

## 2017-02-10 ENCOUNTER — Other Ambulatory Visit: Payer: Self-pay

## 2017-02-10 DIAGNOSIS — M5030 Other cervical disc degeneration, unspecified cervical region: Secondary | ICD-10-CM

## 2017-02-10 DIAGNOSIS — G5690 Unspecified mononeuropathy of unspecified upper limb: Secondary | ICD-10-CM

## 2017-02-10 MED ORDER — GABAPENTIN 100 MG PO CAPS: 100.0000 mg | ORAL_CAPSULE | Freq: Three times a day (TID) | ORAL | 5 refills | Status: DC

## 2017-02-10 NOTE — Telephone Encounter (Signed)
LOV 02/02/2017. Last refill 06/08/2016. Allene DillonEmily Drozdowski, CMA

## 2017-02-14 ENCOUNTER — Ambulatory Visit (INDEPENDENT_AMBULATORY_CARE_PROVIDER_SITE_OTHER): Payer: Medicare Other | Admitting: Physician Assistant

## 2017-02-14 ENCOUNTER — Encounter: Payer: Self-pay | Admitting: Physician Assistant

## 2017-02-14 VITALS — BP 160/88 | HR 52 | Temp 97.9°F | Resp 16 | Wt 124.8 lb

## 2017-02-14 DIAGNOSIS — L304 Erythema intertrigo: Secondary | ICD-10-CM | POA: Diagnosis not present

## 2017-02-14 DIAGNOSIS — L0889 Other specified local infections of the skin and subcutaneous tissue: Secondary | ICD-10-CM

## 2017-02-14 MED ORDER — NYSTATIN 100000 UNIT/GM EX CREA: 1.0000 "application " | TOPICAL_CREAM | Freq: Two times a day (BID) | CUTANEOUS | 0 refills | Status: DC

## 2017-02-14 MED ORDER — DOXYCYCLINE HYCLATE 50 MG PO TABS: 50.0000 mg | ORAL_TABLET | Freq: Two times a day (BID) | ORAL | 0 refills | Status: DC

## 2017-02-14 NOTE — Progress Notes (Signed)
Patient: Sierra Lloyd Female    DOB: 08-Nov-1934   83 y.o.   MRN: [id number] Visit Date: 02/14/2017  Today's Provider: Margaretann Loveless, PA-C   Chief Complaint  Patient presents with  . Follow-up    Eczema   Subjective:    HPI Patient is here today for 2 week follow-up eczema intertrigo. She reports she is still taking the Clindamycin and using Triamcinolone. She reports the "rash" is drying. She also reports that her lips are swollen due to itching and some biting. She reports itching was intense on Saturday night but she took a xanax and put some triamcinolone cream on and was able to fall back asleep.     Allergies  Allergen Reactions  . Tetracycline     Roof of mouth broke out.     Current Outpatient Prescriptions:  .  ALPRAZolam (XANAX) 0.25 MG tablet, TAKE 1/2 TO 1 TABLET BY MOUTH TWICE DAILY AS NEEDED, Disp: 60 tablet, Rfl: 5 .  aspirin 81 MG tablet, Take 81 mg by mouth daily., Disp: , Rfl:  .  bisoprolol-hydrochlorothiazide (ZIAC) 10-6.25 MG tablet, TAKE 1 TABLET BY MOUTH ONCE DAILY, Disp: 90 tablet, Rfl: 1 .  Boswellia-Glucosamine-Vit D (GLUCOSAMINE COMPLEX PO), Take by mouth daily as needed. 1500, Disp: , Rfl:  .  cholecalciferol (VITAMIN D) 1000 UNITS tablet, Take 1,000 Units by mouth. , Disp: , Rfl:  .  clindamycin (CLEOCIN) 300 MG capsule, Take 1 capsule (300 mg total) by mouth 2 (two) times daily., Disp: 20 capsule, Rfl: 0 .  etodolac (LODINE) 500 MG tablet, TAKE 1 TABLET BY MOUTH TWICE DAILY AS NEEDED FOR PAIN., Disp: 180 tablet, Rfl: 1 .  gabapentin (NEURONTIN) 100 MG capsule, Take 1 capsule (100 mg total) by mouth 3 (three) times daily., Disp: 90 capsule, Rfl: 5 .  glucose blood test strip, Use to test blood sugar once a day.  DX: E11.9., Disp: 100 each, Rfl: 3 .  metFORMIN (GLUCOPHAGE) 500 MG tablet, TAKE 1 TABLET BY MOUTH TWICE DAILY., Disp: 180 tablet, Rfl: 3 .  OMEGA-3 FATTY ACIDS PO, Take by mouth. , Disp: , Rfl:  .  triamcinolone cream (KENALOG)  0.1 %, Apply 1 application topically 3 (three) times daily., Disp: 80 g, Rfl: 0 .  vitamin B-12 (CYANOCOBALAMIN) 1000 MCG tablet, Take 1 tablet (1,000 mcg total) by mouth daily., Disp: 1 tablet, Rfl: 0 .  VITAMIN E-1000 PO, Take by mouth. , Disp: , Rfl:   Review of Systems  Constitutional: Negative.   Respiratory: Negative.   Cardiovascular: Negative.   Gastrointestinal: Negative.   Skin: Positive for rash.    Social History  Substance Use Topics  . Smoking status: Never Smoker  . Smokeless tobacco: Never Used  . Alcohol use Yes     Comment: occasional   Objective:   BP (!) 160/88 (BP Location: Left Arm, Patient Position: Sitting, Cuff Size: Normal) Comment: Nervous  Pulse (!) 52   Temp 97.9 F (36.6 C) (Oral)   Resp 16   Wt 124 lb 12.8 oz (56.6 kg)   BMI 27.98 kg/m    Physical Exam  Constitutional: She appears well-developed and well-nourished. No distress.  Neck: No thyromegaly present.  Cardiovascular: Normal rate, regular rhythm and normal heart sounds.  Exam reveals no gallop and no friction rub.   No murmur heard. Pulmonary/Chest: Effort normal and breath sounds normal. No respiratory distress. She has no wheezes. She has no rales.  Skin: Rash noted. Rash is  maculopapular. She is not diaphoretic.     Vitals reviewed.      Assessment & Plan:     1. Eczema intertrigo Will try low dose doxycycline as below for secondary infection. Continue triamcinolone as she has been doing. Will add nystatin for possible secondary fungal infection as well. Try to keep hands out of hot water. Allow hands to have air and not keep covered all day long. I will see her back in 2 weeks. If no improvement in symptoms we will refer to a female dermatologist.  - Doxycycline Hyclate 50 MG TABS; Take 50 mg by mouth 2 (two) times daily.  Dispense: 20 tablet; Refill: 0  2. Secondary infection of skin See above medical treatment plan. - Doxycycline Hyclate 50 MG TABS; Take 50 mg by mouth 2  (two) times daily.  Dispense: 20 tablet; Refill: 0 - nystatin cream (MYCOSTATIN); Apply 1 application topically 2 (two) times daily.  Dispense: 30 g; Refill: 0       Margaretann LovelessJennifer M Tanith Dagostino, PA-C  [facility]

## 2017-02-14 NOTE — Patient Instructions (Signed)
Hand Dermatitis Hand dermatitis is a skin condition that causes small, itchy, raised dots or fluid-filled blisters to form over the palms of the hands. This condition may also be called hand eczema. What are the causes? The cause of this condition is not known. What increases the risk? This condition is more likely to develop in people who have a history of allergies, such as:  Hay fever.  Allergic asthma.  An allergy to latex. Chemical exposure, injuries, and environmental irritants can make hand dermatitis worse. Washing your hands too often can remove natural oils, which can dry out the skin and contribute to outbreaks of this condition. What are the signs or symptoms? The most common symptom of this condition is intense itchiness. Cracks or grooves (fissures) on the fingers can also develop. Affected areas can be painful, especially areas where large blisters have formed. How is this diagnosed? This condition is diagnosed with a medical history and physical exam. How is this treated? This condition is treated with medicines, including:  Steroid creams and ointments.  Oral steroid medicines.  Antibiotic medicines. These are prescribed if you have an infection.  Antihistamine medicines. These help to reduce itchiness. Follow these instructions at home:  Take or apply over-the-counter and prescription medicines only as told by your health care provider.  If you were prescribed an antibiotic medicine, use it as told by your health care provider. Do not stop using the antibiotic even if you start to feel better.  Avoid washing your hands more often than necessary.  Avoid using harsh chemicals on your hands.  Wear protective gloves when you handle products that can irritate your skin.  Keep all follow-up visits as told by your health care provider. This is important. Contact a health care provider if:  Your rash does not improve during the first week of treatment.  Your rash  is red or tender.  Your rash has pus coming from it.  Your rash spreads. This information is not intended to replace advice given to you by your health care provider. Make sure you discuss any questions you have with your health care provider. Document Released: 11/08/2005 Document Revised: 04/15/2016 Document Reviewed: 05/23/2015 Elsevier Interactive Patient Education  2017 Else[employer] Inc.  

## 2017-02-23 ENCOUNTER — Telehealth: Payer: Self-pay | Admitting: Physician Assistant

## 2017-02-23 NOTE — Telephone Encounter (Signed)
Please Review

## 2017-02-23 NOTE — Telephone Encounter (Signed)
If able I would rather wait and see her on Friday first to discuss since she lives alone and had the adverse reaction previously of syncope with prednisone.

## 2017-02-23 NOTE — Telephone Encounter (Signed)
Pt is requesting a Rx for a low dose of prednisone.  [facility].  CB#[phone]/[id number]

## 2017-02-24 NOTE — Telephone Encounter (Signed)
Advised  ED 

## 2017-02-24 NOTE — Telephone Encounter (Signed)
Left message to call back  

## 2017-02-25 ENCOUNTER — Ambulatory Visit: Payer: Medicare Other | Admitting: Physician Assistant

## 2017-03-01 ENCOUNTER — Ambulatory Visit: Payer: Medicare Other | Admitting: Physician Assistant

## 2017-03-02 ENCOUNTER — Ambulatory Visit (INDEPENDENT_AMBULATORY_CARE_PROVIDER_SITE_OTHER): Payer: Medicare Other | Admitting: Physician Assistant

## 2017-03-02 ENCOUNTER — Encounter: Payer: Self-pay | Admitting: Physician Assistant

## 2017-03-02 VITALS — BP 100/70 | HR 60 | Temp 98.7°F | Resp 16 | Wt 121.0 lb

## 2017-03-02 DIAGNOSIS — L299 Pruritus, unspecified: Secondary | ICD-10-CM | POA: Diagnosis not present

## 2017-03-02 DIAGNOSIS — L2084 Intrinsic (allergic) eczema: Secondary | ICD-10-CM | POA: Diagnosis not present

## 2017-03-02 MED ORDER — HYDROXYZINE HCL 10 MG PO TABS: 10.0000 mg | ORAL_TABLET | Freq: Every evening | ORAL | 0 refills | Status: DC | PRN

## 2017-03-02 MED ORDER — PREDNISONE 5 MG PO TABS: 5.0000 mg | ORAL_TABLET | Freq: Every day | ORAL | 0 refills | Status: DC

## 2017-03-02 NOTE — Progress Notes (Signed)
Patient: Sierra Lloyd Female    DOB: December 01, 1933   83 y.o.   MRN: [id number] Visit Date: 03/02/2017  Today's Provider: Margaretann Loveless, PA-C   Chief Complaint  Patient presents with  . Follow-up    Eczema intertrigo   Subjective:    HPI  Follow up for eczema  The patient was last seen for this 2 weeks ago. Changes made at last visit include starting Doxy and Nystatin.  She reports excellent compliance with treatment. She feels that condition is Unchanged. She is not having side effects.   She is now having the rash on her face and low back. She also has an area that is flared at the base of her skull just at her hairline.  ------------------------------------------------------------------------------------    Allergies  Allergen Reactions  . Tetracycline     Roof of mouth broke out.     Current Outpatient Prescriptions:  .  ALPRAZolam (XANAX) 0.25 MG tablet, TAKE 1/2 TO 1 TABLET BY MOUTH TWICE DAILY AS NEEDED, Disp: 60 tablet, Rfl: 5 .  aspirin 81 MG tablet, Take 81 mg by mouth daily., Disp: , Rfl:  .  bisoprolol-hydrochlorothiazide (ZIAC) 10-6.25 MG tablet, TAKE 1 TABLET BY MOUTH ONCE DAILY, Disp: 90 tablet, Rfl: 1 .  Boswellia-Glucosamine-Vit D (GLUCOSAMINE COMPLEX PO), Take by mouth daily as needed. 1500, Disp: , Rfl:  .  cholecalciferol (VITAMIN D) 1000 UNITS tablet, Take 1,000 Units by mouth. , Disp: , Rfl:  .  Doxycycline Hyclate 50 MG TABS, Take 50 mg by mouth 2 (two) times daily., Disp: 20 tablet, Rfl: 0 .  etodolac (LODINE) 500 MG tablet, TAKE 1 TABLET BY MOUTH TWICE DAILY AS NEEDED FOR PAIN., Disp: 180 tablet, Rfl: 1 .  gabapentin (NEURONTIN) 100 MG capsule, Take 1 capsule (100 mg total) by mouth 3 (three) times daily., Disp: 90 capsule, Rfl: 5 .  glucose blood test strip, Use to test blood sugar once a day.  DX: E11.9., Disp: 100 each, Rfl: 3 .  metFORMIN (GLUCOPHAGE) 500 MG tablet, TAKE 1 TABLET BY MOUTH TWICE DAILY., Disp: 180 tablet, Rfl: 3 .   nystatin cream (MYCOSTATIN), Apply 1 application topically 2 (two) times daily., Disp: 30 g, Rfl: 0 .  OMEGA-3 FATTY ACIDS PO, Take by mouth. , Disp: , Rfl:  .  triamcinolone cream (KENALOG) 0.1 %, Apply 1 application topically 3 (three) times daily., Disp: 80 g, Rfl: 0 .  vitamin B-12 (CYANOCOBALAMIN) 1000 MCG tablet, Take 1 tablet (1,000 mcg total) by mouth daily., Disp: 1 tablet, Rfl: 0 .  VITAMIN E-1000 PO, Take by mouth. , Disp: , Rfl:   Review of Systems  Constitutional: Negative.   Respiratory: Negative.   Cardiovascular: Negative.   Gastrointestinal: Negative.   Skin: Positive for color change and rash.  Neurological: Negative.     Social History  Substance Use Topics  . Smoking status: Never Smoker  . Smokeless tobacco: Never Used  . Alcohol use Yes     Comment: occasional   Objective:   BP 100/70 (BP Location: Left Arm, Patient Position: Sitting, Cuff Size: Normal)   Pulse 60   Temp 98.7 F (37.1 C) (Oral)   Resp 16   Wt 121 lb (54.9 kg)   BMI 27.13 kg/m  Vitals:   03/02/17 1142  BP: 100/70  Pulse: 60  Resp: 16  Temp: 98.7 F (37.1 C)  TempSrc: Oral  Weight: 121 lb (54.9 kg)     Physical Exam  Constitutional:  She appears well-developed and well-nourished. No distress.  Neck: Normal range of motion. Neck supple.  Cardiovascular: Normal rate, regular rhythm and normal heart sounds.  Exam reveals no gallop and no friction rub.   No murmur heard. Pulmonary/Chest: Effort normal and breath sounds normal. No respiratory distress. She has no wheezes. She has no rales.  Skin: Rash (diffusely located now with areas at the base of the skull just below the hairline, low back, and left side of face; hands are still involved as well with left > right) noted. Rash is papular. She is not diaphoretic.  Vitals reviewed.      Assessment & Plan:     1. Intrinsic eczema Will try prednisone as below in low dose since she has had adverse reaction once before. She reports  taking it since without complication. Will also give hydroxyzine in really low dose as well for itching. She is to take this at bedtime due to drowsiness side effects. She is to stop creams at this time. I will see her back in 2 weeks to see how she is doing with treatment.  - predniSONE (DELTASONE) 5 MG tablet; Take 1 tablet (5 mg total) by mouth daily with breakfast.  Dispense: 20 tablet; Refill: 0  2. Itching See above medical treatment plan. - hydrOXYzine (ATARAX/VISTARIL) 10 MG tablet; Take 1 tablet (10 mg total) by mouth at bedtime as needed.  Dispense: 30 tablet; Refill: 0 - predniSONE (DELTASONE) 5 MG tablet; Take 1 tablet (5 mg total) by mouth daily with breakfast.  Dispense: 20 tablet; Refill: 0       Margaretann Loveless, PA-C  [facility]

## 2017-03-02 NOTE — Patient Instructions (Signed)
Hand Dermatitis Hand dermatitis is a skin condition. It causes small, itchy, raised dots or fluid-filled blisters to form on the palms of the hands. This condition may also be called hand eczema. Follow these instructions at home:  Take or apply over-the-counter and prescription medicines only as told by your doctor.  If you were prescribed an antibiotic medicine, use it as told by your doctor. Do not stop using the antibiotic even if you start to feel better.  Avoid washing your hands more often than you need to.  Avoid using harsh chemicals on your hands.  Wear gloves that protect your hands when you handle products that can bother (irritate) your skin.  Keep all follow-up visits as told by your doctor. This is important. Contact a doctor if:  Your rash is not better after one week of treatment.  Your rash is red.  Your rash is tender.  Your rash has pus coming from it.  Your rash spreads. This information is not intended to replace advice given to you by your health care provider. Make sure you discuss any questions you have with your health care provider. Document Released: 02/02/2010 Document Revised: 04/15/2016 Document Reviewed: 05/23/2015 Elsevier Interactive Patient Education  2017 Elsevier Inc. Hydroxyzine capsules or tablets What is this medicine? HYDROXYZINE (hye DROX i zeen) is an antihistamine. This medicine is used to treat allergy symptoms. It is also used to treat anxiety and tension. This medicine can be used with other medicines to induce sleep before surgery. This medicine may be used for other purposes; ask your health care provider or pharmacist if you have questions. COMMON BRAND NAME(S): ANX, Atarax, Rezine, Vistaril What should I tell my health care provider before I take this medicine? They need to know if you have any of these conditions: -any chronic illness -difficulty passing urine -glaucoma -heart disease -kidney disease -liver disease -lung  disease -an unusual or allergic reaction to hydroxyzine, cetirizine, other medicines, foods, dyes, or preservatives -pregnant or trying to get pregnant -breast-feeding How should I use this medicine? Take this medicine by mouth with a full glass of water. Follow the directions on the prescription label. You may take this medicine with food or on an empty stomach. Take your medicine at regular intervals. Do not take your medicine more often than directed. Talk to your pediatrician regarding the use of this medicine in children. Special care may be needed. While this drug may be prescribed for children as young as 41 years of age for selected conditions, precautions do apply. Patients over 46 years old may have a stronger reaction and need a smaller dose. Overdosage: If you think you have taken too much of this medicine contact a poison control center or emergency room at once. NOTE: This medicine is only for you. Do not share this medicine with others. What if I miss a dose? If you miss a dose, take it as soon as you can. If it is almost time for your next dose, take only that dose. Do not take double or extra doses. What may interact with this medicine? -alcohol -barbiturate medicines for sleep or seizures -medicines for colds, allergies -medicines for depression, anxiety, or emotional disturbances -medicines for pain -medicines for sleep -muscle relaxants This list may not describe all possible interactions. Give your health care provider a list of all the medicines, herbs, non-prescription drugs, or dietary supplements you use. Also tell them if you smoke, drink alcohol, or use illegal drugs. Some items may interact with your  medicine. What should I watch for while using this medicine? Tell your doctor or health care professional if your symptoms do not improve. You may get drowsy or dizzy. Do not drive, use machinery, or do anything that needs mental alertness until you know how this  medicine affects you. Do not stand or sit up quickly, especially if you are an older patient. This reduces the risk of dizzy or fainting spells. Alcohol may interfere with the effect of this medicine. Avoid alcoholic drinks. Your mouth may get dry. Chewing sugarless gum or sucking hard candy, and drinking plenty of water may help. Contact your doctor if the problem does not go away or is severe. This medicine may cause dry eyes and blurred vision. If you wear contact lenses you may feel some discomfort. Lubricating drops may help. See your eye doctor if the problem does not go away or is severe. If you are receiving skin tests for allergies, tell your doctor you are using this medicine. What side effects may I notice from receiving this medicine? Side effects that you should report to your doctor or health care professional as soon as possible: -fast or irregular heartbeat -difficulty passing urine -seizures -slurred speech or confusion -tremor Side effects that usually do not require medical attention (report to your doctor or health care professional if they continue or are bothersome): -constipation -drowsiness -fatigue -headache -stomach upset This list may not describe all possible side effects. Call your doctor for medical advice about side effects. You may report side effects to FDA at 1-800-FDA-1088. Where should I keep my medicine? Keep out of the reach of children. Store at room temperature between 15 and 30 degrees C (59 and 86 degrees F). Keep container tightly closed. Throw away any unused medicine after the expiration date. NOTE: This sheet is a summary. It may not cover all possible information. If you have questions about this medicine, talk to your doctor, pharmacist, or health care provider.  2018 Elsevier/Gold Standard (2008-03-22 14:50:59)

## 2017-03-14 ENCOUNTER — Ambulatory Visit (INDEPENDENT_AMBULATORY_CARE_PROVIDER_SITE_OTHER): Payer: Medicare Other | Admitting: Physician Assistant

## 2017-03-14 ENCOUNTER — Encounter: Payer: Self-pay | Admitting: Physician Assistant

## 2017-03-14 VITALS — BP 150/78 | HR 50 | Temp 97.9°F | Resp 16 | Wt 120.6 lb

## 2017-03-14 DIAGNOSIS — L0889 Other specified local infections of the skin and subcutaneous tissue: Secondary | ICD-10-CM

## 2017-03-14 DIAGNOSIS — L299 Pruritus, unspecified: Secondary | ICD-10-CM

## 2017-03-14 DIAGNOSIS — L304 Erythema intertrigo: Secondary | ICD-10-CM | POA: Diagnosis not present

## 2017-03-14 MED ORDER — CEPHALEXIN 500 MG PO CAPS: 500.0000 mg | ORAL_CAPSULE | Freq: Two times a day (BID) | ORAL | 0 refills | Status: DC

## 2017-03-14 MED ORDER — TRIAMCINOLONE ACETONIDE 0.1 % EX CREA: 1.0000 "application " | TOPICAL_CREAM | Freq: Three times a day (TID) | CUTANEOUS | 0 refills | Status: DC

## 2017-03-14 MED ORDER — PREDNISONE 5 MG PO TABS: 5.0000 mg | ORAL_TABLET | Freq: Three times a day (TID) | ORAL | 0 refills | Status: DC

## 2017-03-14 NOTE — Patient Instructions (Signed)
Hand Dermatitis Hand dermatitis is a skin condition. It causes small, itchy, raised dots or fluid-filled blisters to form on the palms of the hands. This condition may also be called hand eczema. Follow these instructions at home:  Take or apply over-the-counter and prescription medicines only as told by your doctor.  If you were prescribed an antibiotic medicine, use it as told by your doctor. Do not stop using the antibiotic even if you start to feel better.  Avoid washing your hands more often than you need to.  Avoid using harsh chemicals on your hands.  Wear gloves that protect your hands when you handle products that can bother (irritate) your skin.  Keep all follow-up visits as told by your doctor. This is important. Contact a doctor if:  Your rash is not better after one week of treatment.  Your rash is red.  Your rash is tender.  Your rash has pus coming from it.  Your rash spreads. This information is not intended to replace advice given to you by your health care provider. Make sure you discuss any questions you have with your health care provider. Document Released: 02/02/2010 Document Revised: 04/15/2016 Document Reviewed: 05/23/2015 Elsevier Interactive Patient Education  2017 [employer].

## 2017-03-14 NOTE — Progress Notes (Signed)
Patient: Sierra Lloyd Female    DOB: 1934-02-27   83 y.o.   MRN: [id number] Visit Date: 03/14/2017  Today's Provider: Margaretann Loveless, PA-C   Chief Complaint  Patient presents with  . Follow-up    Eczema   Subjective:    HPI   Eczema, Follow up:  The patient was last seen for eczema 2 weeks ago. Changes made since that visit include Prednisone and Hydroxyzine.  She reports excellent compliance with treatment. She is not having side effects.  She reports that the Prednisone and Hydroxyzine helped some. She reports it was drying up until she completed the prednisone and just over the last two days has had some new lesions start up again and start spreading.  ------------------------------------------------------------------------    Allergies  Allergen Reactions  . Tetracycline     Roof of mouth broke out.     Current Outpatient Prescriptions:  .  ALPRAZolam (XANAX) 0.25 MG tablet, TAKE 1/2 TO 1 TABLET BY MOUTH TWICE DAILY AS NEEDED, Disp: 60 tablet, Rfl: 5 .  aspirin 81 MG tablet, Take 81 mg by mouth daily., Disp: , Rfl:  .  bisoprolol-hydrochlorothiazide (ZIAC) 10-6.25 MG tablet, TAKE 1 TABLET BY MOUTH ONCE DAILY, Disp: 90 tablet, Rfl: 1 .  Boswellia-Glucosamine-Vit D (GLUCOSAMINE COMPLEX PO), Take by mouth daily as needed. 1500, Disp: , Rfl:  .  cholecalciferol (VITAMIN D) 1000 UNITS tablet, Take 1,000 Units by mouth. , Disp: , Rfl:  .  etodolac (LODINE) 500 MG tablet, TAKE 1 TABLET BY MOUTH TWICE DAILY AS NEEDED FOR PAIN., Disp: 180 tablet, Rfl: 1 .  gabapentin (NEURONTIN) 100 MG capsule, Take 1 capsule (100 mg total) by mouth 3 (three) times daily., Disp: 90 capsule, Rfl: 5 .  glucose blood test strip, Use to test blood sugar once a day.  DX: E11.9., Disp: 100 each, Rfl: 3 .  hydrOXYzine (ATARAX/VISTARIL) 10 MG tablet, Take 1 tablet (10 mg total) by mouth at bedtime as needed., Disp: 30 tablet, Rfl: 0 .  metFORMIN (GLUCOPHAGE) 500 MG tablet, TAKE 1 TABLET  BY MOUTH TWICE DAILY., Disp: 180 tablet, Rfl: 3 .  nystatin cream (MYCOSTATIN), Apply 1 application topically 2 (two) times daily., Disp: 30 g, Rfl: 0 .  OMEGA-3 FATTY ACIDS PO, Take by mouth. , Disp: , Rfl:  .  triamcinolone cream (KENALOG) 0.1 %, Apply 1 application topically 3 (three) times daily., Disp: 80 g, Rfl: 0 .  vitamin B-12 (CYANOCOBALAMIN) 1000 MCG tablet, Take 1 tablet (1,000 mcg total) by mouth daily., Disp: 1 tablet, Rfl: 0 .  VITAMIN E-1000 PO, Take by mouth. , Disp: , Rfl:  .  predniSONE (DELTASONE) 5 MG tablet, Take 1 tablet (5 mg total) by mouth daily with breakfast. (Patient not taking: Reported on 03/14/2017), Disp: 20 tablet, Rfl: 0  Review of Systems  Constitutional: Negative.   Respiratory: Negative.   Cardiovascular: Positive for palpitations (anxious). Negative for chest pain and leg swelling.  Gastrointestinal: Negative.   Skin: Positive for rash (Eczema).    Social History  Substance Use Topics  . Smoking status: Never Smoker  . Smokeless tobacco: Never Used  . Alcohol use Yes     Comment: occasional   Objective:   BP (!) 150/78 (BP Location: Left Arm, Patient Position: Sitting, Cuff Size: Normal)   Pulse (!) 50   Temp 97.9 F (36.6 C) (Oral)   Resp 16   Wt 120 lb 9.6 oz (54.7 kg)   SpO2 97%  BMI 27.04 kg/m    Physical Exam  Constitutional: She appears well-developed and well-nourished. No distress.  Neck: Normal range of motion. Neck supple. No JVD present. No tracheal deviation present. No thyromegaly present.  Cardiovascular: Normal rate, regular rhythm and normal heart sounds.  Exam reveals no gallop and no friction rub.   No murmur heard. Pulmonary/Chest: Effort normal and breath sounds normal. No respiratory distress. She has no wheezes. She has no rales.  Lymphadenopathy:    She has no cervical adenopathy.  Skin: She is not diaphoretic.  Diffusely located dry, scaly rash with some fine papules on erythematous base; pruritic.  Vitals  reviewed.     Assessment & Plan:     1. Eczema intertrigo Some improvement noted with addition of prednisone. Will increase prednisone as below since she has tolerated well. Will add keflex back as there is a place on the left wrist that is more red and swollen c/w starting a secondary infection again. Triamcinolone also refilled. I will see her back in 2 weeks to recheck again. If still no improvement would recommend dermatology referral. - predniSONE (DELTASONE) 5 MG tablet; Take 1 tablet (5 mg total) by mouth 3 (three) times daily with meals.  Dispense: 45 tablet; Refill: 0 - triamcinolone cream (KENALOG) 0.1 %; Apply 1 application topically 3 (three) times daily.  Dispense: 80 g; Refill: 0 - cephALEXin (KEFLEX) 500 MG capsule; Take 1 capsule (500 mg total) by mouth 2 (two) times daily.  Dispense: 20 capsule; Refill: 0  2. Secondary infection of skin See above medical treatment plan. - predniSONE (DELTASONE) 5 MG tablet; Take 1 tablet (5 mg total) by mouth 3 (three) times daily with meals.  Dispense: 45 tablet; Refill: 0 - cephALEXin (KEFLEX) 500 MG capsule; Take 1 capsule (500 mg total) by mouth 2 (two) times daily.  Dispense: 20 capsule; Refill: 0  3. Itching See above medical treatment plan. - predniSONE (DELTASONE) 5 MG tablet; Take 1 tablet (5 mg total) by mouth 3 (three) times daily with meals.  Dispense: 45 tablet; Refill: 0       Margaretann Loveless, PA-C  [facility]

## 2017-03-28 ENCOUNTER — Encounter: Payer: Self-pay | Admitting: Physician Assistant

## 2017-03-28 ENCOUNTER — Ambulatory Visit (INDEPENDENT_AMBULATORY_CARE_PROVIDER_SITE_OTHER): Payer: Medicare Other | Admitting: Physician Assistant

## 2017-03-28 VITALS — BP 130/80 | HR 69 | Temp 97.9°F | Resp 16 | Wt 120.4 lb

## 2017-03-28 DIAGNOSIS — L304 Erythema intertrigo: Secondary | ICD-10-CM | POA: Diagnosis not present

## 2017-03-28 MED ORDER — PREDNISONE 20 MG PO TABS: 20.0000 mg | ORAL_TABLET | Freq: Two times a day (BID) | ORAL | 0 refills | Status: DC

## 2017-03-28 NOTE — Progress Notes (Signed)
Patient: Sierra Lloyd Female    DOB: 04/07/1934   83 y.o.   MRN: [id number] Visit Date: 03/28/2017  Today's Provider: Margaretann Loveless, PA-C   Chief Complaint  Patient presents with  . Follow-up    Eczema   Subjective:    HPI Patient is here today to follow-up on Eczema Intertrigo.   Eczema, Follow up:  The patient was last seen for Eczema 2 weeks ago. Changes made since that visit include increase Prednisone and Keflex was added back. If no improvement would recommend dermatology referral.  She reports excellent compliance with treatment. She is not having side effects.  She reports that she is doing better. Slowly but better.  She reports that because of the Prednisone her sugar levels are a high.She reports that on Saturday morning it was 193, yesterday 188 and this morning it was 153. ------------------------------------------------------------------------    Allergies  Allergen Reactions  . Tetracycline     Roof of mouth broke out.     Current Outpatient Prescriptions:  .  ALPRAZolam (XANAX) 0.25 MG tablet, TAKE 1/2 TO 1 TABLET BY MOUTH TWICE DAILY AS NEEDED, Disp: 60 tablet, Rfl: 5 .  aspirin 81 MG tablet, Take 81 mg by mouth daily., Disp: , Rfl:  .  bisoprolol-hydrochlorothiazide (ZIAC) 10-6.25 MG tablet, TAKE 1 TABLET BY MOUTH ONCE DAILY, Disp: 90 tablet, Rfl: 1 .  Boswellia-Glucosamine-Vit D (GLUCOSAMINE COMPLEX PO), Take by mouth daily as needed. 1500, Disp: , Rfl:  .  gabapentin (NEURONTIN) 100 MG capsule, Take 1 capsule (100 mg total) by mouth 3 (three) times daily., Disp: 90 capsule, Rfl: 5 .  glucose blood test strip, Use to test blood sugar once a day.  DX: E11.9., Disp: 100 each, Rfl: 3 .  metFORMIN (GLUCOPHAGE) 500 MG tablet, TAKE 1 TABLET BY MOUTH TWICE DAILY., Disp: 180 tablet, Rfl: 3 .  nystatin cream (MYCOSTATIN), Apply 1 application topically 2 (two) times daily., Disp: 30 g, Rfl: 0 .  OMEGA-3 FATTY ACIDS PO, Take by mouth. , Disp: , Rfl:   .  predniSONE (DELTASONE) 5 MG tablet, Take 1 tablet (5 mg total) by mouth 3 (three) times daily with meals., Disp: 45 tablet, Rfl: 0 .  triamcinolone cream (KENALOG) 0.1 %, Apply 1 application topically 3 (three) times daily., Disp: 80 g, Rfl: 0 .  cephALEXin (KEFLEX) 500 MG capsule, Take 1 capsule (500 mg total) by mouth 2 (two) times daily. (Patient not taking: Reported on 03/28/2017), Disp: 20 capsule, Rfl: 0 .  cholecalciferol (VITAMIN D) 1000 UNITS tablet, Take 1,000 Units by mouth. , Disp: , Rfl:  .  etodolac (LODINE) 500 MG tablet, TAKE 1 TABLET BY MOUTH TWICE DAILY AS NEEDED FOR PAIN. (Patient not taking: Reported on 03/28/2017), Disp: 180 tablet, Rfl: 1 .  hydrOXYzine (ATARAX/VISTARIL) 10 MG tablet, Take 1 tablet (10 mg total) by mouth at bedtime as needed. (Patient not taking: Reported on 03/28/2017), Disp: 30 tablet, Rfl: 0 .  vitamin B-12 (CYANOCOBALAMIN) 1000 MCG tablet, Take 1 tablet (1,000 mcg total) by mouth daily. (Patient not taking: Reported on 03/28/2017), Disp: 1 tablet, Rfl: 0 .  VITAMIN E-1000 PO, Take by mouth. , Disp: , Rfl:   Review of Systems  Constitutional: Positive for fatigue.  Respiratory: Negative for cough, chest tightness and shortness of breath.   Cardiovascular: Negative for chest pain, palpitations and leg swelling.  Musculoskeletal: Positive for back pain (low back pain) and neck pain.  Neurological: Positive for weakness (UE) and  numbness (from stroke).  Psychiatric/Behavioral: Positive for dysphoric mood (getting sad because she can't do for herself like she used to).    Social History  Substance Use Topics  . Smoking status: Never Smoker  . Smokeless tobacco: Never Used  . Alcohol use Yes     Comment: occasional   Objective:   BP 130/80 (BP Location: Left Arm, Patient Position: Sitting, Cuff Size: Normal)   Pulse 69   Temp 97.9 F (36.6 C) (Oral)   Resp 16   Wt 120 lb 6.4 oz (54.6 kg)   SpO2 99%   BMI 26.99 kg/m    Physical Exam    Constitutional: She appears well-developed and well-nourished. No distress.  Neck: Normal range of motion. Neck supple.  Cardiovascular: Normal rate, regular rhythm and normal heart sounds.  Exam reveals no gallop and no friction rub.   No murmur heard. Pulmonary/Chest: Effort normal and breath sounds normal. No respiratory distress. She has no wheezes. She has no rales.  Skin: Rash noted. She is not diaphoretic.  Diffuse erythematous rash with some papules and scales, much improved since last check. Improved about 80% No longer on face  Psychiatric: She exhibits a depressed mood.  Vitals reviewed.       Assessment & Plan:     1. Eczema intertrigo Much improved. Will give one more dose of prednisone as below. She is to continue using good lotions and trying to keep her hands from hot water. We also discussed restarting PT but she does not wish to do this at this time. She reports she can't rely on her cousin to bring her to appts twice weekly and refuses to have home PT, reports it is due to her dog. I will see her back in 4 weeks and she is to call if symptoms worsen before then.  - predniSONE (DELTASONE) 20 MG tablet; Take 1 tablet (20 mg total) by mouth 2 (two) times daily with a meal. X 2 weeks, then decrease to 1 tab PO daily  Dispense: 35 tablet; Refill: 0       Margaretann LovelessJennifer M Aaliyana Fredericks, PA-C  [facility]

## 2017-03-28 NOTE — Patient Instructions (Addendum)
Crisaborole topical ointment What is this medicine? CRISABOROLE Sierra Lloyd(Kris a BO role) is used topically to treat atopic dermatitis. This medicine may be used for other purposes; ask your health care provider or pharmacist if you have questions. COMMON BRAND NAME(S): EUCRISA What should I tell my health care provider before I take this medicine? They need to know if you have any of these conditions: -an unusual or allergic reaction to crisaborole, other medicines, foods, dyes, or preservatives -pregnant or trying to get pregnant -breast-feeding How should I use this medicine? This medicine is for external use only. Do not take by mouth. Follow the direction on the prescription label. Wash your hands before and after use. Apply a thin film to the affected areas twice a day, or as often as directed by your doctor or health care professional. Rub the ointment in thoroughly. Do not use your medicine more often than directed. Talk to your pediatrician regarding the use of this medicine in children. Special care may be needed. While this drug may be prescribed children as young as 412 years of age for selected conditions, precautions do apply. Overdosage: If you think you have taken too much of this medicine contact a poison control center or emergency room at once. NOTE: This medicine is only for you. Do not share this medicine with others. What if I miss a dose? If you miss a dose, use it as soon as you can. If it is almost time for your next dose, use only that dose. Do not use double or extra doses. What may interact with this medicine? Interactions are not expected. Do not use any other skin products on the affected area without asking your doctor or health care professional. This list may not describe all possible interactions. Give your health care provider a list of all the medicines, herbs, non-prescription drugs, or dietary supplements you use. Also tell them if you smoke, drink alcohol, or use  illegal drugs. Some items may interact with your medicine. What should I watch for while using this medicine? Do not get this medicine in your eyes. If you do, rinse out with plenty of cool tap water. Tell your doctor or health care professional if your symptoms do not start to get better or if they get worse. What side effects may I notice from receiving this medicine? Side effects that you should report to your doctor or health care professional as soon as possible: -allergic reactions like skin rash, itching or hives, swelling of the face, lips, or tongue Side effects that usually do not require medical attention (report to your doctor or health care professional if they continue or are bothersome): -hives -stinging or burning This list may not describe all possible side effects. Call your doctor for medical advice about side effects. You may report side effects to FDA at 1-800-FDA-1088. Where should I keep my medicine? Keep out of the reach of children. Store at room temperature between 20 and 25 degrees C (68 and 77 degrees F). Keep this medicine in the original container. Throw away any unused medicine after the expiration date. NOTE: This sheet is a summary. It may not cover all possible information. If you have questions about this medicine, talk to your doctor, pharmacist, or health care provider.  2018 Elsevier/Gold Standard (2016-09-11 18:15:22) Hand Dermatitis Hand dermatitis is a skin condition. It causes small, itchy, raised dots or fluid-filled blisters to form on the palms of the hands. This condition may also be called hand eczema. Follow  these instructions at home:  Take or apply over-the-counter and prescription medicines only as told by your doctor.  If you were prescribed an antibiotic medicine, use it as told by your doctor. Do not stop using the antibiotic even if you start to feel better.  Avoid washing your hands more often than you need to.  Avoid using harsh  chemicals on your hands.  Wear gloves that protect your hands when you handle products that can bother (irritate) your skin.  Keep all follow-up visits as told by your doctor. This is important. Contact a doctor if:  Your rash is not better after one week of treatment.  Your rash is red.  Your rash is tender.  Your rash has pus coming from it.  Your rash spreads. This information is not intended to replace advice given to you by your health care provider. Make sure you discuss any questions you have with your health care provider. Document Released: 02/02/2010 Document Revised: 04/15/2016 Document Reviewed: 05/23/2015 Elsevier Interactive Patient Education  2017 [employer].

## 2017-04-01 ENCOUNTER — Telehealth: Payer: Self-pay

## 2017-04-01 DIAGNOSIS — L2084 Intrinsic (allergic) eczema: Secondary | ICD-10-CM

## 2017-04-01 MED ORDER — CRISABOROLE 2 % EX OINT: 1.0000 "application " | TOPICAL_OINTMENT | Freq: Every day | CUTANEOUS | 3 refills | Status: DC

## 2017-04-01 NOTE — Telephone Encounter (Signed)
Had a long detailed conversation with patient reassuring her. She is going to discontinue prednisone since rash has almost cleared. She will increase metformin to TID for the weekend and will call Monday if sugars are still elevated. Eucrisa samples have helped her rash. Will send in Rx for Eucrisa to see if we can get approval through insurance company.

## 2017-04-01 NOTE — Telephone Encounter (Signed)
Patient called to report that her b.s was high at 230 and she has been on prednisone. Patient states that she was informed by you that prednisone can cause spike in sugar levels. Patient states that she would like to d/c prednisone do to high b.s, she states she is very upset and would like to discuss with you further. KW

## 2017-04-13 ENCOUNTER — Other Ambulatory Visit: Payer: Self-pay | Admitting: Physician Assistant

## 2017-04-13 DIAGNOSIS — I10 Essential (primary) hypertension: Secondary | ICD-10-CM

## 2017-04-13 NOTE — Telephone Encounter (Signed)
[facility] sent refill on bisoprolol-hydrochlorothiazide (ZIAC) 10-6.25 MG tablet  90 day supply

## 2017-04-13 NOTE — Telephone Encounter (Signed)
Last ov 03/28/17 Last filled 11/11/16 Please review. Thank you. sd

## 2017-04-14 MED ORDER — BISOPROLOL-HYDROCHLOROTHIAZIDE 10-6.25 MG PO TABS: 1.0000 | ORAL_TABLET | Freq: Every day | ORAL | 1 refills | Status: DC

## 2017-04-21 ENCOUNTER — Ambulatory Visit (INDEPENDENT_AMBULATORY_CARE_PROVIDER_SITE_OTHER): Payer: Medicare Other | Admitting: Physician Assistant

## 2017-04-21 ENCOUNTER — Encounter: Payer: Self-pay | Admitting: Physician Assistant

## 2017-04-21 VITALS — BP 154/72 | HR 60 | Temp 98.1°F | Resp 16 | Wt 118.0 lb

## 2017-04-21 DIAGNOSIS — L304 Erythema intertrigo: Secondary | ICD-10-CM

## 2017-04-21 DIAGNOSIS — R21 Rash and other nonspecific skin eruption: Secondary | ICD-10-CM | POA: Diagnosis not present

## 2017-04-21 MED ORDER — TRIAMCINOLONE ACETONIDE 0.1 % EX CREA: 1.0000 "application " | TOPICAL_CREAM | Freq: Three times a day (TID) | CUTANEOUS | 0 refills | Status: DC

## 2017-04-21 NOTE — Progress Notes (Signed)
Patient: Sierra Lloyd Female    DOB: 05/29/1934   83 y.o.   MRN: [id number] Visit Date: 04/21/2017  Today's Provider: Margaretann LovelessJennifer M Ashanti Littles, PA-C   Chief Complaint  Patient presents with  . Eczema   Subjective:    HPI   Eczema: Patient complains of rash. Rash is on hands, and has since spread to trunk, legs, head, buttocks. Onset of symptoms several months ago, and have been gradually worsening since that time (since D/C Prednisone). Treatment modalities that have been used in the past include: Elocon Cream, Triamcinolone cream, clindamycin, doxy, Nystatin Cream, prednisone, hydroxyzine, Keflex and Eucrisa. Per pt, the Eucrisa is ineffective. She can not take the Prednisone because it causes syncope. Pt's BS is improving after D/C prednisone as well; fasting BS read 147 yesterday.   Allergies  Allergen Reactions  . Tetracycline     Roof of mouth broke out.     Current Outpatient Prescriptions:  .  ALPRAZolam (XANAX) 0.25 MG tablet, TAKE 1/2 TO 1 TABLET BY MOUTH TWICE DAILY AS NEEDED, Disp: 60 tablet, Rfl: 5 .  aspirin 81 MG tablet, Take 81 mg by mouth daily., Disp: , Rfl:  .  bisoprolol-hydrochlorothiazide (ZIAC) 10-6.25 MG tablet, Take 1 tablet by mouth daily., Disp: 90 tablet, Rfl: 1 .  Boswellia-Glucosamine-Vit D (GLUCOSAMINE COMPLEX PO), Take by mouth daily as needed. 1500, Disp: , Rfl:  .  cholecalciferol (VITAMIN D) 1000 UNITS tablet, Take 1,000 Units by mouth. , Disp: , Rfl:  .  etodolac (LODINE) 500 MG tablet, TAKE 1 TABLET BY MOUTH TWICE DAILY AS NEEDED FOR PAIN., Disp: 180 tablet, Rfl: 1 .  gabapentin (NEURONTIN) 100 MG capsule, Take 1 capsule (100 mg total) by mouth 3 (three) times daily., Disp: 90 capsule, Rfl: 5 .  glucose blood test strip, Use to test blood sugar once a day.  DX: E11.9., Disp: 100 each, Rfl: 3 .  metFORMIN (GLUCOPHAGE) 500 MG tablet, TAKE 1 TABLET BY MOUTH TWICE DAILY., Disp: 180 tablet, Rfl: 3 .  nystatin cream (MYCOSTATIN), Apply 1  application topically 2 (two) times daily., Disp: 30 g, Rfl: 0 .  OMEGA-3 FATTY ACIDS PO, Take by mouth. , Disp: , Rfl:  .  triamcinolone cream (KENALOG) 0.1 %, Apply 1 application topically 3 (three) times daily., Disp: 80 g, Rfl: 0 .  vitamin B-12 (CYANOCOBALAMIN) 1000 MCG tablet, Take 1 tablet (1,000 mcg total) by mouth daily., Disp: 1 tablet, Rfl: 0 .  VITAMIN E-1000 PO, Take by mouth. , Disp: , Rfl:  .  hydrOXYzine (ATARAX/VISTARIL) 10 MG tablet, Take 1 tablet (10 mg total) by mouth at bedtime as needed. (Patient not taking: Reported on 03/28/2017), Disp: 30 tablet, Rfl: 0  Review of Systems  Constitutional: Positive for diaphoresis (night sweats) and unexpected weight change (due to Prednisone causing nausea and appetite change). Negative for activity change, appetite change, chills, fatigue and fever.  Respiratory: Negative for cough and shortness of breath.   Cardiovascular: Positive for leg swelling. Negative for chest pain and palpitations.  Skin: Positive for rash.    Social History  Substance Use Topics  . Smoking status: Never Smoker  . Smokeless tobacco: Never Used  . Alcohol use Yes     Comment: occasional   Objective:   BP (!) 154/72 (BP Location: Left Arm, Patient Position: Sitting, Cuff Size: Normal)   Pulse 60   Temp 98.1 F (36.7 C) (Oral)   Resp 16   Wt 118 lb (53.5 kg)  BMI 26.46 kg/m  Vitals:   04/21/17 0957  BP: (!) 154/72  Pulse: 60  Resp: 16  Temp: 98.1 F (36.7 C)  TempSrc: Oral  Weight: 118 lb (53.5 kg)     Physical Exam  Constitutional: She appears well-developed and well-nourished. No distress.  Neck: Normal range of motion. Neck supple.  Cardiovascular: Normal rate, regular rhythm and normal heart sounds.  Exam reveals no gallop and no friction rub.   No murmur heard. Pulmonary/Chest: Effort normal and breath sounds normal. No respiratory distress. She has no wheezes. She has no rales.  Skin: Rash noted. Rash is macular (diffuse  erythematous macules with scaling border; pruritic). She is not diaphoretic.     Psychiatric: Her mood appears anxious.  Vitals reviewed.      Assessment & Plan:     1. Eczema intertrigo Oral prednisone seemed to help the rash the most and was almost completely cleared until patient's blood sugars started increasing and made her feel bad, then she became very anxious with taking the prednisone and was advised to discontinue. Since discontinuing the rash has slowly started to return. Suspect severe eczema but since it has been resistant to treatment will refer to dermatology. Patient is very anxious about referral and has refused up until this point. She is now agreeable to referral but demands that no needles be used at any time.  - triamcinolone cream (KENALOG) 0.1 %; Apply 1 application topically 3 (three) times daily.  Dispense: 80 g; Refill: 0 - Ambulatory referral to Dermatology  2. Rash See above medical treatment plan. - Ambulatory referral to Dermatology       Margaretann Loveless, PA-C  [facility]

## 2017-04-22 ENCOUNTER — Telehealth: Payer: Self-pay

## 2017-04-22 DIAGNOSIS — L2084 Intrinsic (allergic) eczema: Secondary | ICD-10-CM

## 2017-04-22 NOTE — Telephone Encounter (Signed)
Patient is requesting Sierra Lloyd to give her a call back to discuss dermatology report she received today. [phone]

## 2017-04-25 MED ORDER — PREDNISONE 5 MG PO TABS: 5.0000 mg | ORAL_TABLET | Freq: Two times a day (BID) | ORAL | 0 refills | Status: DC

## 2017-04-25 NOTE — Telephone Encounter (Signed)
Patient was concerned because dermatology appt was not until 06/13/17. Advised patient this is normal time frame and to keep appt. She agrees.  She is wanting to start the low dose prednisone back since it had been helping. High dose increased sugars too much. She is to continue to monitor sugar and if near 200 she is to take metformin TID instead of BID. She will call to schedule appt in 2 weeks for f/u here.

## 2017-04-27 ENCOUNTER — Ambulatory Visit: Payer: Medicare Other | Admitting: Physician Assistant

## 2017-05-04 ENCOUNTER — Telehealth: Payer: Self-pay | Admitting: Physician Assistant

## 2017-05-04 NOTE — Telephone Encounter (Signed)
Noted  

## 2017-05-04 NOTE — Telephone Encounter (Signed)
Pt states she has been trying to get an appointment with a dermatologist for the rash she has on her body.  Pt states she was not able to see the dermatologist until 06/21/17.    Pt states her hair dresser sees  [place]ree D[place]Graham in [place] and she got pt an appointment for tomorrow.  Pt is going to cancel the other appointment.    Pt wanted to let you know.  CB#[phone]/MW

## 2017-05-05 DIAGNOSIS — L710 Perioral dermatitis: Secondary | ICD-10-CM | POA: Diagnosis not present

## 2017-05-05 DIAGNOSIS — L2089 Other atopic dermatitis: Secondary | ICD-10-CM | POA: Diagnosis not present

## 2017-05-09 ENCOUNTER — Ambulatory Visit: Payer: Medicare Other | Admitting: Physician Assistant

## 2017-05-18 DIAGNOSIS — L2089 Other atopic dermatitis: Secondary | ICD-10-CM | POA: Diagnosis not present

## 2017-05-18 DIAGNOSIS — L710 Perioral dermatitis: Secondary | ICD-10-CM | POA: Diagnosis not present

## 2017-06-03 DIAGNOSIS — L710 Perioral dermatitis: Secondary | ICD-10-CM | POA: Diagnosis not present

## 2017-06-03 DIAGNOSIS — L2089 Other atopic dermatitis: Secondary | ICD-10-CM | POA: Diagnosis not present

## 2017-06-15 ENCOUNTER — Other Ambulatory Visit: Payer: Self-pay | Admitting: Physician Assistant

## 2017-06-15 DIAGNOSIS — E119 Type 2 diabetes mellitus without complications: Secondary | ICD-10-CM

## 2017-06-15 MED ORDER — METFORMIN HCL 500 MG PO TABS: 500.0000 mg | ORAL_TABLET | Freq: Two times a day (BID) | ORAL | 3 refills | Status: DC

## 2017-06-15 NOTE — Telephone Encounter (Signed)
[facility] pharmacy faxed a request on the following medication.  Thanks CC  metFORMIN (GLUCOPHAGE) 500 MG tablet  >Take 1 tablet by mouth twice daily.

## 2017-06-23 ENCOUNTER — Other Ambulatory Visit: Payer: Self-pay | Admitting: Physician Assistant

## 2017-06-23 ENCOUNTER — Encounter: Payer: Self-pay | Admitting: Physician Assistant

## 2017-06-23 ENCOUNTER — Ambulatory Visit (INDEPENDENT_AMBULATORY_CARE_PROVIDER_SITE_OTHER): Payer: Medicare Other | Admitting: Physician Assistant

## 2017-06-23 VITALS — BP 130/90 | HR 56 | Temp 98.2°F | Resp 16 | Wt 114.0 lb

## 2017-06-23 DIAGNOSIS — E119 Type 2 diabetes mellitus without complications: Secondary | ICD-10-CM | POA: Diagnosis not present

## 2017-06-23 DIAGNOSIS — F411 Generalized anxiety disorder: Secondary | ICD-10-CM

## 2017-06-23 DIAGNOSIS — L2084 Intrinsic (allergic) eczema: Secondary | ICD-10-CM | POA: Diagnosis not present

## 2017-06-23 LAB — POCT GLYCOSYLATED HEMOGLOBIN (HGB A1C)
ESTIMATED AVERAGE GLUCOSE: 169
Hemoglobin A1C: 7.5

## 2017-06-23 MED ORDER — ALPRAZOLAM 0.25 MG PO TABS: ORAL_TABLET | ORAL | 5 refills | Status: DC

## 2017-06-23 MED ORDER — CLOBETASOL PROPIONATE 0.05 % EX CREA: 1.0000 "application " | TOPICAL_CREAM | Freq: Two times a day (BID) | CUTANEOUS | 0 refills | Status: DC

## 2017-06-23 NOTE — Telephone Encounter (Signed)
[facility] faxed a request on the following medication. Thanks CC  ALPRAZolam (XANAX) 0.25 MG tablet

## 2017-06-23 NOTE — Progress Notes (Signed)
Patient: Sierra Lloyd Female    DOB: 05/04/1934   83 y.o.   MRN: [id number] Visit Date: 06/23/2017  Today's Provider: Margaretann LovelessJennifer M Earley Grobe, PA-C   Chief Complaint  Patient presents with  . Diabetes   Subjective:    HPI  Diabetes Mellitus Type II, Follow-up:   Lab Results  Component Value Date   HGBA1C 7.5 06/23/2017   HGBA1C 6.8 12/27/2016   HGBA1C 6.7 09/07/2016    Last seen for diabetes 1 years ago.  Management since then includes no changes. She reports fair compliance with treatment. She is not having side effects.  Current symptoms include none and have been stable. Home blood sugar records: fasting range: 136 last week Patient has been on multiple doses and prolonged treatment with steroids for an atopic dermatitis that has been difficult to treat.   Episodes of hypoglycemia? no   Current Insulin Regimen:  Most Recent Eye Exam: UTD Weight trend: stable Prior visit with dietician: no Current diet: in general, a "healthy" diet   Current exercise: none  Pertinent Labs: No results found for: CHOL, TRIG, HDL, LDLCALC, CREATININE  Wt Readings from Last 3 Encounters:  06/23/17 114 lb (51.7 kg)  04/21/17 118 lb (53.5 kg)  03/28/17 120 lb 6.4 oz (54.6 kg)   ------------------------------------------------------------------------     Allergies  Allergen Reactions  . Tetracycline     Roof of mouth broke out.     Current Outpatient Prescriptions:  .  aspirin 81 MG tablet, Take 81 mg by mouth daily., Disp: , Rfl:  .  bisoprolol-hydrochlorothiazide (ZIAC) 10-6.25 MG tablet, Take 1 tablet by mouth daily., Disp: 90 tablet, Rfl: 1 .  Boswellia-Glucosamine-Vit D (GLUCOSAMINE COMPLEX PO), Take by mouth daily as needed. 1500, Disp: , Rfl:  .  cholecalciferol (VITAMIN D) 1000 UNITS tablet, Take 1,000 Units by mouth. , Disp: , Rfl:  .  etodolac (LODINE) 500 MG tablet, TAKE 1 TABLET BY MOUTH TWICE DAILY AS NEEDED FOR PAIN., Disp: 180 tablet, Rfl: 1 .   gabapentin (NEURONTIN) 100 MG capsule, Take 1 capsule (100 mg total) by mouth 3 (three) times daily., Disp: 90 capsule, Rfl: 5 .  glucose blood test strip, Use to test blood sugar once a day.  DX: E11.9., Disp: 100 each, Rfl: 3 .  hydrOXYzine (ATARAX/VISTARIL) 10 MG tablet, Take 1 tablet (10 mg total) by mouth at bedtime as needed., Disp: 30 tablet, Rfl: 0 .  metFORMIN (GLUCOPHAGE) 500 MG tablet, Take 1 tablet (500 mg total) by mouth 2 (two) times daily., Disp: 180 tablet, Rfl: 3 .  nystatin cream (MYCOSTATIN), Apply 1 application topically 2 (two) times daily., Disp: 30 g, Rfl: 0 .  OMEGA-3 FATTY ACIDS PO, Take by mouth. , Disp: , Rfl:  .  predniSONE (DELTASONE) 5 MG tablet, Take 1 tablet (5 mg total) by mouth 2 (two) times daily with a meal., Disp: 30 tablet, Rfl: 0 .  triamcinolone cream (KENALOG) 0.1 %, Apply 1 application topically 3 (three) times daily., Disp: 80 g, Rfl: 0 .  vitamin B-12 (CYANOCOBALAMIN) 1000 MCG tablet, Take 1 tablet (1,000 mcg total) by mouth daily., Disp: 1 tablet, Rfl: 0 .  VITAMIN E-1000 PO, Take by mouth. , Disp: , Rfl:  .  ALPRAZolam (XANAX) 0.25 MG tablet, TAKE 1/2 TO 1 TABLET BY MOUTH TWICE DAILY AS NEEDED, Disp: 60 tablet, Rfl: 5  Review of Systems  Constitutional: Positive for activity change and fatigue.  Respiratory: Negative.   Cardiovascular: Negative.  Gastrointestinal: Negative.   Endocrine: Negative.   Psychiatric/Behavioral: The patient is nervous/anxious.     Social History  Substance Use Topics  . Smoking status: Never Smoker  . Smokeless tobacco: Never Used  . Alcohol use Yes     Comment: occasional   Objective:   BP 130/90 (BP Location: Left Arm, Patient Position: Sitting, Cuff Size: Normal)   Pulse (!) 56   Temp 98.2 F (36.8 C) (Oral)   Resp 16   Wt 114 lb (51.7 kg)   SpO2 97%   BMI 25.56 kg/m  Vitals:   06/23/17 1635  BP: 130/90  Pulse: (!) 56  Resp: 16  Temp: 98.2 F (36.8 C)  TempSrc: Oral  SpO2: 97%  Weight: 114 lb  (51.7 kg)     Physical Exam  Constitutional: She appears well-developed and well-nourished. No distress.  Cardiovascular: Normal rate, regular rhythm and normal heart sounds.  Exam reveals no gallop and no friction rub.   No murmur heard. Pulmonary/Chest: Effort normal and breath sounds normal.  Skin: She is not diaphoretic.  Psychiatric: Her mood appears anxious.  Vitals reviewed.       Assessment & Plan:     1. Type 2 diabetes mellitus without complication, without long-term current use of insulin (HCC) A1c up some today to 7.5 but patient has been on multiple steroids. Thus I feel her sugar is actually doing well. Continue metformin. Will continue to monitor and recheck in 3 months.  - POCT glycosylated hemoglobin (Hb A1C)  2. Intrinsic atopic dermatitis Clobetasol cream given as below to hopefully calm down the returning eczema before it becomes full body rash again.  - clobetasol cream (TEMOVATE) 0.05 %; Apply 1 application topically 2 (two) times daily.  Dispense: 30 g; Refill: 0  3. Anxiety, generalized Alprazolam Rx printed to be called in to her pharmacy.        Margaretann LovelessJennifer M Smayan Hackbart, PA-C  [facility]

## 2017-06-24 NOTE — Telephone Encounter (Signed)
Prescription called in to [facility].  Thanks,  -Lida Berkery

## 2017-06-28 ENCOUNTER — Ambulatory Visit: Payer: Medicare Other | Admitting: Physician Assistant

## 2017-07-04 DIAGNOSIS — H2511 Age-related nuclear cataract, right eye: Secondary | ICD-10-CM | POA: Diagnosis not present

## 2017-07-04 LAB — HM DIABETES EYE EXAM

## 2017-07-07 ENCOUNTER — Encounter: Payer: Self-pay | Admitting: Physician Assistant

## 2017-08-24 ENCOUNTER — Ambulatory Visit (INDEPENDENT_AMBULATORY_CARE_PROVIDER_SITE_OTHER): Payer: Medicare Other | Admitting: Physician Assistant

## 2017-08-24 ENCOUNTER — Encounter: Payer: Self-pay | Admitting: Physician Assistant

## 2017-08-24 VITALS — BP 140/88 | HR 59 | Temp 98.8°F | Resp 16 | Wt 111.6 lb

## 2017-08-24 DIAGNOSIS — Z2821 Immunization not carried out because of patient refusal: Secondary | ICD-10-CM

## 2017-08-24 DIAGNOSIS — L301 Dyshidrosis [pompholyx]: Secondary | ICD-10-CM | POA: Diagnosis not present

## 2017-08-24 MED ORDER — METHYLPREDNISOLONE 4 MG PO TABS: 4.0000 mg | ORAL_TABLET | Freq: Every day | ORAL | 0 refills | Status: DC

## 2017-08-24 MED ORDER — TRIAMCINOLONE ACETONIDE 0.1 % EX CREA: 1.0000 "application " | TOPICAL_CREAM | Freq: Three times a day (TID) | CUTANEOUS | 0 refills | Status: DC
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
## 2017-08-24 NOTE — Patient Instructions (Signed)
Hand Dermatitis  Hand dermatitis is a skin condition that causes small, itchy, raised dots or fluid-filled blisters to form over the palms of the hands. This condition may also be called hand eczema.  What are the causes?  The cause of this condition is not known.  What increases the risk?  This condition is more likely to develop in people who have a history of allergies, such as:   Hay fever.   Allergic asthma.   An allergy to latex.    Chemical exposure, injuries, and environmental irritants can make hand dermatitis worse. Washing your hands too often can remove natural oils, which can dry out the skin and contribute to outbreaks of this condition.  What are the signs or symptoms?  The most common symptom of this condition is intense itchiness. Cracks or grooves (fissures) on the fingers can also develop. Affected areas can be painful, especially areas where large blisters have formed.  How is this diagnosed?  This condition is diagnosed with a medical history and physical exam.  How is this treated?  This condition is treated with medicines, including:   Steroid creams and ointments.   Oral steroid medicines.   Antibiotic medicines. These are prescribed if you have an infection.   Antihistamine medicines. These help to reduce itchiness.    Follow these instructions at home:   Take or apply over-the-counter and prescription medicines only as told by your health care provider.   If you were prescribed an antibiotic medicine, use it as told by your health care provider. Do not stop using the antibiotic even if you start to feel better.   Avoid washing your hands more often than necessary.   Avoid using harsh chemicals on your hands.   Wear protective gloves when you handle products that can irritate your skin.   Keep all follow-up visits as told by your health care provider. This is important.  Contact a health care provider if:   Your rash does not improve during the first week of treatment.   Your  rash is red or tender.   Your rash has pus coming from it.   Your rash spreads.  This information is not intended to replace advice given to you by your health care provider. Make sure you discuss any questions you have with your health care provider.  Document Released: 11/08/2005 Document Revised: 04/15/2016 Document Reviewed: 05/23/2015  Elsevier Interactive Patient Education  2018 Elsevier Inc.

## 2017-09-21 ENCOUNTER — Encounter: Payer: Self-pay | Admitting: Physician Assistant

## 2017-09-21 ENCOUNTER — Ambulatory Visit (INDEPENDENT_AMBULATORY_CARE_PROVIDER_SITE_OTHER): Payer: Medicare Other | Admitting: Physician Assistant

## 2017-09-21 VITALS — BP 150/80 | HR 67 | Temp 99.4°F | Resp 16 | Wt 112.2 lb

## 2017-09-21 DIAGNOSIS — E119 Type 2 diabetes mellitus without complications: Secondary | ICD-10-CM

## 2017-09-21 DIAGNOSIS — K5900 Constipation, unspecified: Secondary | ICD-10-CM | POA: Diagnosis not present

## 2017-09-21 DIAGNOSIS — L2084 Intrinsic (allergic) eczema: Secondary | ICD-10-CM | POA: Diagnosis not present

## 2017-09-21 DIAGNOSIS — M2550 Pain in unspecified joint: Secondary | ICD-10-CM | POA: Diagnosis not present

## 2017-09-21 LAB — POCT GLYCOSYLATED HEMOGLOBIN (HGB A1C)
Est. average glucose Bld gHb Est-mCnc: 171
Hemoglobin A1C: 7.6

## 2017-09-21 MED ORDER — POLYETHYLENE GLYCOL 3350 17 GM/SCOOP PO POWD: 1.0000 | Freq: Once | ORAL | 0 refills | Status: AC

## 2017-09-21 MED ORDER — BETAMETHASONE DIPROPIONATE AUG 0.05 % EX CREA: TOPICAL_CREAM | Freq: Two times a day (BID) | CUTANEOUS | 3 refills | Status: DC

## 2017-09-21 NOTE — Patient Instructions (Signed)
Polyethylene Glycol powder What is this medicine? POLYETHYLENE GLYCOL 3350 (pol ee ETH i leen; GLYE col) powder is a laxative used to treat constipation. It increases the amount of water in the stool. Bowel movements become easier and more frequent. This medicine may be used for other purposes; ask your health care provider or pharmacist if you have questions. COMMON BRAND NAME(S): GaviLax, GIALAX, GlycoLax, MiraLax, Smooth LAX, [facility] [facility] What should I tell my health care provider before I take this medicine? They need to know if you have any of these conditions: -a history of blockage of the stomach or intestine -current abdomen distension or pain -difficulty swallowing -diverticulitis, ulcerative colitis, or other chronic bowel disease -phenylketonuria -an unusual or allergic reaction to polyethylene glycol, other medicines, dyes, or preservatives -pregnant or trying to get pregnant -breast-feeding How should I use this medicine? Take this medicine by mouth. The bottle has a measuring cap that is marked with a line. Pour the powder into the cap up to the marked line (the dose is about 1 heaping tablespoon). Add the powder in the cap to a full glass (4 to 8 ounces or 120 to 240 ml) of water, juice, soda, coffee or tea. Mix the powder well. Drink the solution. Take exactly as directed. Do not take your medicine more often than directed. Talk to your pediatrician regarding the use of this medicine in children. Special care may be needed. Overdosage: If you think you have taken too much of this medicine contact a poison control center or emergency room at once. NOTE: This medicine is only for you. Do not share this medicine with others. What if I miss a dose? If you miss a dose, take it as soon as you can. If it is almost time for your next dose, take only that dose. Do not take double or extra doses. What may interact with this medicine? Interactions are not expected. This list may not  describe all possible interactions. Give your health care provider a list of all the medicines, herbs, non-prescription drugs, or dietary supplements you use. Also tell them if you smoke, drink alcohol, or use illegal drugs. Some items may interact with your medicine. What should I watch for while using this medicine? Do not use for more than 2 weeks without advice from your doctor or health care professional. It can take 2 to 4 days to have a bowel movement and to experience improvement in constipation. See your health care professional for any changes in bowel habits, including constipation, that are severe or last longer than three weeks. Always take this medicine with plenty of water. What side effects may I notice from receiving this medicine? Side effects that you should report to your doctor or health care professional as soon as possible: -diarrhea -difficulty breathing -itching of the skin, hives, or skin rash -severe bloating, pain, or distension of the stomach -vomiting Side effects that usually do not require medical attention (report to your doctor or health care professional if they continue or are bothersome): -bloating or gas -lower abdominal discomfort or cramps -nausea This list may not describe all possible side effects. Call your doctor for medical advice about side effects. You may report side effects to FDA at 1-800-FDA-1088. Where should I keep my medicine? Keep out of the reach of children. Store between 15 and 30 degrees C (59 and 86 degrees F). Throw away any unused medicine after the expiration date. NOTE: This sheet is a summary. It may not cover all   possible information. If you have questions about this medicine, talk to your doctor, pharmacist, or health care provider.  2018 Elsevier/Gold Standard (2008-06-10 16:50:45)  

## 2017-09-21 NOTE — Progress Notes (Signed)
Patient: Sierra Lloyd Female    DOB: 1934/03/14   83 y.o.   MRN: [id number] Visit Date: 09/21/2017  Today's Provider: Margaretann Loveless, PA-C   Chief Complaint  Patient presents with  . Follow-up    Diabetes   Subjective:    HPI Patient is here for A1C follow-up. She reports that she check her sugar levels last week and it was in between the 140's.  Lab Results  Component Value Date   HGBA1C 7.6 09/21/2017   HGBA1C 7.5 06/23/2017   HGBA1C 6.8 12/27/2016    She reports that she is feeling very stiff and her hands are feeling really cold and more stiff than usual. She reports she has not been up and doing things like she normally does. She feels that her hands and legs aching are coming from her inactivity.   She reports she has a Dermatology appointment in Dec. For her eczema.    Allergies  Allergen Reactions  . Tetracycline     Roof of mouth broke out.     Current Outpatient Prescriptions:  .  ALPRAZolam (XANAX) 0.25 MG tablet, TAKE 1/2 TO 1 TABLET BY MOUTH TWICE DAILY AS NEEDED, Disp: 60 tablet, Rfl: 5 .  aspirin 81 MG tablet, Take 81 mg by mouth daily., Disp: , Rfl:  .  bisoprolol-hydrochlorothiazide (ZIAC) 10-6.25 MG tablet, Take 1 tablet by mouth daily., Disp: 90 tablet, Rfl: 1 .  Boswellia-Glucosamine-Vit D (GLUCOSAMINE COMPLEX PO), Take by mouth daily as needed. 1500, Disp: , Rfl:  .  cholecalciferol (VITAMIN D) 1000 UNITS tablet, Take 1,000 Units by mouth. , Disp: , Rfl:  .  clobetasol cream (TEMOVATE) 0.05 %, Apply 1 application topically 2 (two) times daily., Disp: 30 g, Rfl: 0 .  metFORMIN (GLUCOPHAGE) 500 MG tablet, Take 1 tablet (500 mg total) by mouth 2 (two) times daily., Disp: 180 tablet, Rfl: 3 .  nystatin cream (MYCOSTATIN), Apply 1 application topically 2 (two) times daily., Disp: 30 g, Rfl: 0 .  OMEGA-3 FATTY ACIDS PO, Take by mouth. , Disp: , Rfl:  .  triamcinolone cream (KENALOG) 0.1 %, Apply 1 application topically 3 (three) times  daily., Disp: 80 g, Rfl: 0 .  vitamin B-12 (CYANOCOBALAMIN) 1000 MCG tablet, Take 1 tablet (1,000 mcg total) by mouth daily., Disp: 1 tablet, Rfl: 0 .  VITAMIN E-1000 PO, Take by mouth. , Disp: , Rfl:  .  etodolac (LODINE) 500 MG tablet, TAKE 1 TABLET BY MOUTH TWICE DAILY AS NEEDED FOR PAIN. (Patient not taking: Reported on 08/24/2017), Disp: 180 tablet, Rfl: 1 .  gabapentin (NEURONTIN) 100 MG capsule, Take 1 capsule (100 mg total) by mouth 3 (three) times daily. (Patient not taking: Reported on 09/21/2017), Disp: 90 capsule, Rfl: 5 .  glucose blood test strip, Use to test blood sugar once a day.  DX: E11.9. (Patient not taking: Reported on 08/24/2017), Disp: 100 each, Rfl: 3 .  hydrOXYzine (ATARAX/VISTARIL) 10 MG tablet, Take 1 tablet (10 mg total) by mouth at bedtime as needed. (Patient not taking: Reported on 08/24/2017), Disp: 30 tablet, Rfl: 0 .  methylPREDNISolone (MEDROL) 4 MG tablet, Take 1 tablet (4 mg total) by mouth daily. (Patient not taking: Reported on 09/21/2017), Disp: 30 tablet, Rfl: 0  Review of Systems  Constitutional: Positive for fatigue.  Respiratory: Negative for cough, chest tightness and shortness of breath.   Cardiovascular: Negative for chest pain, palpitations and leg swelling.  Gastrointestinal: Negative for abdominal pain.  Musculoskeletal: Positive  for arthralgias, gait problem, myalgias and neck stiffness.  Skin: Positive for rash.  Neurological: Positive for weakness. Negative for dizziness, light-headedness and headaches.  Psychiatric/Behavioral: The patient is nervous/anxious.     Social History  Substance Use Topics  . Smoking status: Never Smoker  . Smokeless tobacco: Never Used  . Alcohol use Yes     Comment: occasional   Objective:   BP (!) 150/80 (BP Location: Left Arm, Patient Position: Sitting, Cuff Size: Normal)   Pulse 67   Temp 99.4 F (37.4 C) (Oral)   Resp 16   Wt 112 lb 3.2 oz (50.9 kg)   SpO2 97%   BMI 25.15 kg/m     Physical Exam    Constitutional: She appears well-developed and well-nourished. No distress.  Neck: Normal range of motion. Neck supple. No JVD present. No tracheal deviation present. No thyromegaly present.  Cardiovascular: Normal rate, regular rhythm and normal heart sounds.  Exam reveals no gallop and no friction rub.   No murmur heard. Pulmonary/Chest: Effort normal and breath sounds normal. No respiratory distress. She has no wheezes. She has no rales.  Lymphadenopathy:    She has no cervical adenopathy.  Skin: Rash noted. Rash is macular. She is not diaphoretic.     Vitals reviewed.      Assessment & Plan:     1. Type 2 diabetes mellitus without complication, without long-term current use of insulin (HCC) A1c stable. Continue metformin 500mg  BID. I will see her back in 3 months.  - POCT glycosylated hemoglobin (Hb A1C)  2. Constipation, unspecified constipation type Patient reports long standing history of constipation. Used to require laxative daily. Now having symptoms where "feels like stool gets to my rectal opening but won't come out." Advised to start miralax daily. She is to call if no improvements and will discuss doing a cologuard to r/o colon cancer or polyp as cause. Patient refuses colonoscopy at this time and would not be a great candidate unless something were found for colonoscopy. She would get colonoscopy if cologuard were positive. - polyethylene glycol powder (GLYCOLAX/MIRALAX) powder; Take 255 g by mouth once.  Dispense: 255 g; Refill: 0  3. Intrinsic eczema Refilled cream as below. Will have a trial off oral steroids and see if cream will help. I will see her back in 3 months. She has dermatology f/u with Dr. Roseanne KaufmanIsenstein in December.  - augmented betamethasone dipropionate (DIPROLENE-AF) 0.05 % cream; Apply topically 2 (two) times daily.  Dispense: 50 g; Refill: 3  4. Arthralgia, unspecified joint Discussed restarting PT for fall risk and strengthening her. She wants to discuss  with her cousin to see if she could bring her at least once per week. Patient refuses in home PT. May require medicare transportation assistance if her cousin can not bring her. She is to call if and when she wants to start PT and let us know if she needs transportation assistance.        Margaretann LovelessJennifer M Karmin Kasprzak, PA-C  [facility]

## 2017-09-26 ENCOUNTER — Ambulatory Visit: Payer: Medicare Other | Admitting: Physician Assistant

## 2017-09-28 ENCOUNTER — Telehealth: Payer: Self-pay

## 2017-09-28 DIAGNOSIS — L2084 Intrinsic (allergic) eczema: Secondary | ICD-10-CM

## 2017-09-28 NOTE — Telephone Encounter (Signed)
Patient is requesting a call back today if possible from KentonJennifer regarding medications and a rash. Patient states that Antony ContrasJenni advised her to call back. CB#[phone]

## 2017-09-29 MED ORDER — METHYLPREDNISOLONE 4 MG PO TABS: 4.0000 mg | ORAL_TABLET | Freq: Every day | ORAL | 0 refills | Status: DC

## 2017-09-29 NOTE — Telephone Encounter (Signed)
Called patient and left VM. Will call again in the morning.

## 2017-09-29 NOTE — Telephone Encounter (Signed)
Yes spoke with patient and she reports she "really" needs to talk to HaynesJenni. Patient is aware that it will be later.  Thanks,  -Joseline

## 2017-09-29 NOTE — Telephone Encounter (Signed)
I have sent in the methylprednisolone like she just recently had to [facility]. I can call her this afternoon if she needs to speak with me.

## 2017-09-29 NOTE — Telephone Encounter (Signed)
Spoke with patient and reports that her rash is really bab and that she wants to talk to GrahamJenni about the Prednisone.  Thanks,  -Joseline

## 2017-09-30 NOTE — Telephone Encounter (Signed)
Patient wanted to let you know that she is sorry that you guys missed each other.  Thank you for the prescription.  Hopefully it will help.  She wanted you to know that she really appreciated you taking her call.

## 2017-09-30 NOTE — Telephone Encounter (Signed)
Called patient again and left VM.

## 2017-10-18 ENCOUNTER — Other Ambulatory Visit: Payer: Self-pay | Admitting: Family Medicine

## 2017-10-18 ENCOUNTER — Telehealth: Payer: Self-pay | Admitting: Physician Assistant

## 2017-10-18 DIAGNOSIS — I10 Essential (primary) hypertension: Secondary | ICD-10-CM

## 2017-10-18 DIAGNOSIS — L309 Dermatitis, unspecified: Secondary | ICD-10-CM

## 2017-10-18 MED ORDER — TRIAMCINOLONE ACETONIDE 0.1 % EX CREA: 1.0000 "application " | TOPICAL_CREAM | Freq: Two times a day (BID) | CUTANEOUS | 0 refills | Status: DC

## 2017-10-18 NOTE — Telephone Encounter (Signed)
Pt needs refill on topical cream   triamciolone cream  Kenalog   Almost out.  Please refill asap   [facility]  Thanks teri

## 2017-10-18 NOTE — Telephone Encounter (Signed)
I have sent in Kenalog cream. Please alert patient that it is ready at pharmacy.

## 2017-10-25 DIAGNOSIS — L239 Allergic contact dermatitis, unspecified cause: Secondary | ICD-10-CM | POA: Diagnosis not present

## 2017-10-25 DIAGNOSIS — B86 Scabies: Secondary | ICD-10-CM | POA: Diagnosis not present

## 2017-11-18 DIAGNOSIS — B86 Scabies: Secondary | ICD-10-CM | POA: Diagnosis not present

## 2017-11-18 DIAGNOSIS — L239 Allergic contact dermatitis, unspecified cause: Secondary | ICD-10-CM | POA: Diagnosis not present

## 2017-12-28 ENCOUNTER — Encounter: Payer: Self-pay | Admitting: Physician Assistant

## 2017-12-28 ENCOUNTER — Ambulatory Visit (INDEPENDENT_AMBULATORY_CARE_PROVIDER_SITE_OTHER): Payer: Medicare Other | Admitting: Physician Assistant

## 2017-12-28 VITALS — BP 170/70 | HR 75 | Temp 98.4°F | Resp 16 | Wt 114.0 lb

## 2017-12-28 DIAGNOSIS — I10 Essential (primary) hypertension: Secondary | ICD-10-CM

## 2017-12-28 DIAGNOSIS — F411 Generalized anxiety disorder: Secondary | ICD-10-CM | POA: Diagnosis not present

## 2017-12-28 DIAGNOSIS — E119 Type 2 diabetes mellitus without complications: Secondary | ICD-10-CM | POA: Diagnosis not present

## 2017-12-28 LAB — POCT GLYCOSYLATED HEMOGLOBIN (HGB A1C)
Est. average glucose Bld gHb Est-mCnc: 151
HEMOGLOBIN A1C: 6.9

## 2017-12-28 MED ORDER — BISOPROLOL-HYDROCHLOROTHIAZIDE 10-6.25 MG PO TABS: 1.0000 | ORAL_TABLET | Freq: Every day | ORAL | 3 refills | Status: DC

## 2017-12-28 MED ORDER — METFORMIN HCL 500 MG PO TABS: 500.0000 mg | ORAL_TABLET | Freq: Two times a day (BID) | ORAL | 3 refills | Status: DC

## 2017-12-28 MED ORDER — ALPRAZOLAM 0.5 MG PO TABS
0.2500 mg | ORAL_TABLET | Freq: Two times a day (BID) | ORAL | 5 refills | Status: DC | PRN
Start: 2017-12-28 — End: 2018-06-21

## 2017-12-28 NOTE — Patient Instructions (Signed)
Type 2 Diabetes Mellitus, Diagnosis, Adult Type 2 diabetes (type 2 diabetes mellitus) is a long-term (chronic) disease. It may be caused by one or both of these problems:  Your body does not make enough of a hormone called insulin.  Your body does not react in a normal way to insulin that it makes.  Insulin lets sugars (glucose) go into cells in the body. This gives you energy. If you have type 2 diabetes, sugars cannot get into cells. This causes high blood sugar (hyperglycemia). Your doctor will set treatment goals for you. Generally, you should have these blood sugar levels:  Before meals (preprandial): 80-130 mg/dL (4.4-7.2 mmol/L).  After meals (postprandial): below 180 mg/dL (10 mmol/L).  A1c (hemoglobin A1c) level: less than 7%.  Follow these instructions at home: Questions to Ask Your Doctor  You may want to ask these questions:  Do I need to meet with a diabetes educator?  Where can I find a support group for people with diabetes?  What equipment will I need to care for myself at home?  What diabetes medicines do I need? When should I take them?  How often do I need to check my blood sugar?  What number can I call if I have questions?  When is my next doctor's visit?  General instructions  Take over-the-counter and prescription medicines only as told by your doctor.  Keep all follow-up visits as told by your doctor. This is important. Contact a doctor if:  Your blood sugar is at or above 240 mg/dL (13.3 mmol/L) for 2 days in a row.  You have been sick or have had a fever for 2 days or more and you are not getting better.  You have any of these problems for more than 6 hours: ? You cannot eat or drink. ? You feel sick to your stomach (nauseous). ? You throw up (vomit). ? You have watery poop (diarrhea). Get help right away if:  Your blood sugar is lower than 54 mg/dL (3 mmol/L).  You get confused.  You have trouble: ? Thinking  clearly. ? Breathing.  You have moderate or large ketone levels in your pee (urine). This information is not intended to replace advice given to you by your health care provider. Make sure you discuss any questions you have with your health care provider. Document Released: 08/17/2008 Document Revised: 04/15/2016 Document Reviewed: 12/12/2015 Elsevier Interactive Patient Education  2018 Elsevier Inc.   

## 2017-12-28 NOTE — Progress Notes (Signed)
.9      Patient: Sierra Lloyd Female    DOB: 09-16-1934   84 y.o.   MRN: [id number] Visit Date: 12/28/2017  Today's Provider: Margaretann Loveless, PA-C   Chief Complaint  Patient presents with  . Follow-up    A1C   Subjective:    HPI Patient is here for A1C follow-up. Current symptoms/problems include none and have been stable.  Cardiovascular risk factors: advanced age (older than 67 for men, 9 for women) and hypertension Current diabetic medications include Metformin 500 mg BID.   Eye exam current (within one year): yes She reports she has an eye appointment this month. Weight trend: stable Current diet: in general, a "healthy" diet    Current monitoring regimen: home blood tests - sometimes Home blood sugar records: last week 138 Any episodes of hypoglycemia? no  Lab Results  Component Value Date   HGBA1C 6.9 12/28/2017   HGBA1C 7.6 09/21/2017   HGBA1C 7.5 06/23/2017      Allergies  Allergen Reactions  . Tetracycline     Roof of mouth broke out.     Current Outpatient Medications:  .  ALPRAZolam (XANAX) 0.25 MG tablet, TAKE 1/2 TO 1 TABLET BY MOUTH TWICE DAILY AS NEEDED, Disp: 60 tablet, Rfl: 5 .  aspirin 81 MG tablet, Take 81 mg by mouth daily., Disp: , Rfl:  .  bisoprolol-hydrochlorothiazide (ZIAC) 10-6.25 MG tablet, TAKE 1 TABLET BY MOUTH ONCE DAILY, Disp: 90 tablet, Rfl: 1 .  Boswellia-Glucosamine-Vit D (GLUCOSAMINE COMPLEX PO), Take by mouth daily as needed. 1500, Disp: , Rfl:  .  cholecalciferol (VITAMIN D) 1000 UNITS tablet, Take 1,000 Units by mouth. , Disp: , Rfl:  .  glucose blood test strip, Use to test blood sugar once a day.  DX: E11.9., Disp: 100 each, Rfl: 3 .  metFORMIN (GLUCOPHAGE) 500 MG tablet, Take 1 tablet (500 mg total) by mouth 2 (two) times daily., Disp: 180 tablet, Rfl: 3 .  OMEGA-3 FATTY ACIDS PO, Take by mouth. , Disp: , Rfl:  .  vitamin B-12 (CYANOCOBALAMIN) 1000 MCG tablet, Take 1 tablet (1,000 mcg total) by mouth daily., Disp: 1  tablet, Rfl: 0 .  augmented betamethasone dipropionate (DIPROLENE-AF) 0.05 % cream, Apply topically 2 (two) times daily. (Patient not taking: Reported on 12/28/2017), Disp: 50 g, Rfl: 3 .  clobetasol cream (TEMOVATE) 0.05 %, Apply 1 application topically 2 (two) times daily. (Patient not taking: Reported on 12/28/2017), Disp: 30 g, Rfl: 0 .  methylPREDNISolone (MEDROL) 4 MG tablet, Take 1 tablet (4 mg total) daily by mouth. (Patient not taking: Reported on 12/28/2017), Disp: 30 tablet, Rfl: 0 .  nystatin cream (MYCOSTATIN), Apply 1 application topically 2 (two) times daily. (Patient not taking: Reported on 12/28/2017), Disp: 30 g, Rfl: 0 .  triamcinolone cream (KENALOG) 0.1 %, Apply 1 application topically 2 (two) times daily. (Patient not taking: Reported on 12/28/2017), Disp: 30 g, Rfl: 0 .  VITAMIN E-1000 PO, Take by mouth. , Disp: , Rfl:   Review of Systems  Constitutional: Negative.   Respiratory: Negative.   Cardiovascular: Negative.  Negative for chest pain and palpitations.  Psychiatric/Behavioral: The patient is nervous/anxious.     Social History   Tobacco Use  . Smoking status: Never Smoker  . Smokeless tobacco: Never Used  Substance Use Topics  . Alcohol use: Yes    Comment: occasional   Objective:   BP (!) 170/70 (BP Location: Left Arm, Patient Position: Sitting, Cuff Size: Normal)   Pulse  75 Comment: 75-60  Temp 98.4 F (36.9 C) (Oral)   Resp 16   Wt 114 lb (51.7 kg)   SpO2 98%   BMI 25.56 kg/m    Physical Exam  Constitutional: She appears well-developed and well-nourished. No distress.  Neck: Normal range of motion. Neck supple.  Cardiovascular: Normal rate, regular rhythm and normal heart sounds. Exam reveals no gallop and no friction rub.  No murmur heard. Pulmonary/Chest: Effort normal and breath sounds normal. No respiratory distress. She has no wheezes. She has no rales.  Skin: She is not diaphoretic.  Vitals reviewed.  Diabetic Foot Exam - Simple   Simple Foot  Form Diabetic Foot exam was performed with the following findings:  Yes 12/29/2017  4:44 PM  Visual Inspection No deformities, no ulcerations, no other skin breakdown bilaterally:  Yes Sensation Testing Intact to touch and monofilament testing bilaterally:  Yes Pulse Check Posterior Tibialis and Dorsalis pulse intact bilaterally:  Yes Comments       Assessment & Plan:     1. Type 2 diabetes mellitus without complication, without long-term current use of insulin (HCC) A1c much improved at 6.9. Continue metformin as below. I will see her back in 3 months.  - POCT glycosylated hemoglobin (Hb A1C) - metFORMIN (GLUCOPHAGE) 500 MG tablet; Take 1 tablet (500 mg total) by mouth 2 (two) times daily.  Dispense: 180 tablet; Refill: 3  2. Essential hypertension Stable. Diagnosis pulled for medication refill. Continue current medical treatment plan. - bisoprolol-hydrochlorothiazide (ZIAC) 10-6.25 MG tablet; Take 1 tablet by mouth daily.  Dispense: 90 tablet; Refill: 3  3. Anxiety, generalized Stable. Diagnosis pulled for medication refill. Continue current medical treatment plan. - ALPRAZolam (XANAX) 0.5 MG tablet; Take 0.5-1 tablets (0.25-0.5 mg total) by mouth 2 (two) times daily as needed for anxiety.  Dispense: 60 tablet; Refill: 5       Margaretann LovelessJennifer M Jlon Betker, PA-C  [facility]

## 2018-01-09 DIAGNOSIS — L309 Dermatitis, unspecified: Secondary | ICD-10-CM | POA: Diagnosis not present

## 2018-02-08 ENCOUNTER — Encounter: Payer: Self-pay | Admitting: Physician Assistant

## 2018-02-08 ENCOUNTER — Ambulatory Visit (INDEPENDENT_AMBULATORY_CARE_PROVIDER_SITE_OTHER): Payer: Medicare Other | Admitting: Physician Assistant

## 2018-02-08 VITALS — BP 140/84 | HR 64 | Temp 97.6°F | Resp 16 | Wt 116.0 lb

## 2018-02-08 DIAGNOSIS — L2084 Intrinsic (allergic) eczema: Secondary | ICD-10-CM

## 2018-02-08 DIAGNOSIS — L298 Other pruritus: Secondary | ICD-10-CM

## 2018-02-08 DIAGNOSIS — F419 Anxiety disorder, unspecified: Secondary | ICD-10-CM

## 2018-02-08 DIAGNOSIS — L2989 Other pruritus: Secondary | ICD-10-CM

## 2018-02-08 MED ORDER — BETAMETHASONE DIPROPIONATE AUG 0.05 % EX CREA
TOPICAL_CREAM | Freq: Two times a day (BID) | CUTANEOUS | 0 refills | Status: DC
Start: 2018-02-08 — End: 2018-03-24

## 2018-02-08 MED ORDER — PREDNISONE 5 MG PO TABS: 5.0000 mg | ORAL_TABLET | Freq: Every day | ORAL | 0 refills | Status: DC

## 2018-02-08 NOTE — Progress Notes (Signed)
Patient: Sierra Lloyd Female    DOB: [birth year]   84 y.o.   MRN: [id number] Visit Date: 02/08/2018  Today's Provider: Margaretann Loveless, PA-C   Chief Complaint  Patient presents with  . Anxiety   Subjective:    HPI  Anxiety, Follow-up  She  was last seen for this 6 weeks ago. Changes made at last visit include increasing Xanax.   She reports excellent compliance with treatment. She is not having side effects.   She reports excellent tolerance of treatment. Current symptoms include: anxiety, nervousness, fear of falling She feels she is Unchanged since last visit. ------------------------------------------------------------------------    Allergies  Allergen Reactions  . Tetracycline     Roof of mouth broke out.     Current Outpatient Medications:  .  ALPRAZolam (XANAX) 0.5 MG tablet, Take 0.5-1 tablets (0.25-0.5 mg total) by mouth 2 (two) times daily as needed for anxiety., Disp: 60 tablet, Rfl: 5 .  aspirin 81 MG tablet, Take 81 mg by mouth daily., Disp: , Rfl:  .  bisoprolol-hydrochlorothiazide (ZIAC) 10-6.25 MG tablet, Take 1 tablet by mouth daily., Disp: 90 tablet, Rfl: 3 .  Boswellia-Glucosamine-Vit D (GLUCOSAMINE COMPLEX PO), Take by mouth daily as needed. 1500, Disp: , Rfl:  .  cholecalciferol (VITAMIN D) 1000 UNITS tablet, Take 1,000 Units by mouth. , Disp: , Rfl:  .  glucose blood test strip, Use to test blood sugar once a day.  DX: E11.9., Disp: 100 each, Rfl: 3 .  metFORMIN (GLUCOPHAGE) 500 MG tablet, Take 1 tablet (500 mg total) by mouth 2 (two) times daily., Disp: 180 tablet, Rfl: 3 .  OMEGA-3 FATTY ACIDS PO, Take by mouth. , Disp: , Rfl:  .  vitamin B-12 (CYANOCOBALAMIN) 1000 MCG tablet, Take 1 tablet (1,000 mcg total) by mouth daily., Disp: 1 tablet, Rfl: 0 .  VITAMIN E-1000 PO, Take by mouth. , Disp: , Rfl:  .  triamcinolone cream (KENALOG) 0.1 %, , Disp: , Rfl:   Review of Systems  Constitutional: Positive for activity change.  HENT:  Negative.   Respiratory: Negative.   Cardiovascular: Negative.   Skin: Positive for rash.    Social History   Tobacco Use  . Smoking status: Never Smoker  . Smokeless tobacco: Never Used  Substance Use Topics  . Alcohol use: Yes    Comment: occasional   Objective:   BP 140/84 (BP Location: Left Arm, Patient Position: Sitting, Cuff Size: Normal)   Pulse 64   Temp 97.6 F (36.4 C) (Oral)   Resp 16   Wt 116 lb (52.6 kg)   SpO2 98%   BMI 26.01 kg/m  Vitals:   02/08/18 1344  BP: 140/84  Pulse: 64  Resp: 16  Temp: 97.6 F (36.4 C)  TempSrc: Oral  SpO2: 98%  Weight: 116 lb (52.6 kg)     Physical Exam  Constitutional: She appears well-developed and well-nourished. No distress.  Neck: Normal range of motion. Neck supple. No JVD present. No tracheal deviation present. No thyromegaly present.  Cardiovascular: Normal rate, regular rhythm and normal heart sounds. Exam reveals no gallop and no friction rub.  No murmur heard. Pulmonary/Chest: Effort normal and breath sounds normal. No respiratory distress. She has no wheezes. She has no rales.  Lymphadenopathy:    She has no cervical adenopathy.  Skin: Rash noted. Rash is maculopapular. She is not diaphoretic.  Psychiatric: Her mood appears anxious.  Vitals reviewed.       Assessment &  Plan:     1. Intrinsic eczema Starting to flare up again. Suspect anxiety driven. Will give prednisone as below and once completed she is to transition to the Diprolene cream as below.  - augmented betamethasone dipropionate (DIPROLENE-AF) 0.05 % cream; Apply topically 2 (two) times daily.  Dispense: 50 g; Refill: 0 - predniSONE (DELTASONE) 5 MG tablet; Take 1 tablet (5 mg total) by mouth daily with breakfast.  Dispense: 20 tablet; Refill: 0  2. Chronic pruritic rash in adult  3. Anxiety Not well controlled. Long standing history that is worsening with age due to fears of falling and being alone. She declines wanting PT for gait  instability, declines allowing someone to come in and set up a life alert, fall alert system for her, and also declines any other medication changes at this time feeling she may react adversely and they may put her at increased risk for fall.         Margaretann LovelessJennifer M Burnette, PA-C  [facility]

## 2018-02-14 ENCOUNTER — Encounter: Payer: Self-pay | Admitting: Physician Assistant

## 2018-03-23 ENCOUNTER — Telehealth: Payer: Self-pay | Admitting: Physician Assistant

## 2018-03-23 DIAGNOSIS — L2084 Intrinsic (allergic) eczema: Secondary | ICD-10-CM

## 2018-03-23 NOTE — Telephone Encounter (Signed)
Pt called requesting a call back to discuss a salve that was prescribed for her at last visit.I tried to get more information but pt did not want to discuss with me.It looks like it may have been for eczema from office note.She states she will come into office if you would like

## 2018-03-24 MED ORDER — BETAMETHASONE DIPROPIONATE AUG 0.05 % EX CREA: TOPICAL_CREAM | Freq: Two times a day (BID) | CUTANEOUS | 0 refills | Status: DC

## 2018-03-24 NOTE — Telephone Encounter (Signed)
Needed refill of betamethasone so this was sent in for her.

## 2018-06-21 ENCOUNTER — Ambulatory Visit (INDEPENDENT_AMBULATORY_CARE_PROVIDER_SITE_OTHER): Payer: Medicare Other | Admitting: Physician Assistant

## 2018-06-21 ENCOUNTER — Encounter: Payer: Self-pay | Admitting: Physician Assistant

## 2018-06-21 VITALS — BP 158/80 | HR 53 | Temp 99.2°F | Resp 16 | Wt 115.2 lb

## 2018-06-21 DIAGNOSIS — F411 Generalized anxiety disorder: Secondary | ICD-10-CM | POA: Diagnosis not present

## 2018-06-21 DIAGNOSIS — I6930 Unspecified sequelae of cerebral infarction: Secondary | ICD-10-CM | POA: Diagnosis not present

## 2018-06-21 DIAGNOSIS — E119 Type 2 diabetes mellitus without complications: Secondary | ICD-10-CM

## 2018-06-21 DIAGNOSIS — L2084 Intrinsic (allergic) eczema: Secondary | ICD-10-CM | POA: Diagnosis not present

## 2018-06-21 DIAGNOSIS — R29898 Other symptoms and signs involving the musculoskeletal system: Secondary | ICD-10-CM

## 2018-06-21 LAB — POCT GLYCOSYLATED HEMOGLOBIN (HGB A1C)
ESTIMATED AVERAGE GLUCOSE: 148
HEMOGLOBIN A1C: 6.8 % — AB (ref 4.0–5.6)

## 2018-06-21 MED ORDER — ALPRAZOLAM 0.5 MG PO TABS
0.2500 mg | ORAL_TABLET | Freq: Two times a day (BID) | ORAL | 5 refills | Status: DC | PRN
Start: 2018-06-21 — End: 2018-11-23

## 2018-06-21 MED ORDER — TRIAMCINOLONE ACETONIDE 0.1 % EX CREA: TOPICAL_CREAM | Freq: Two times a day (BID) | CUTANEOUS | 0 refills | Status: DC

## 2018-06-21 NOTE — Patient Instructions (Signed)

## 2018-06-21 NOTE — Progress Notes (Signed)
Patient: Sierra Lloyd Female    DOB: 10-14-1934   84 y.o.   MRN: [id number] Visit Date: 06/21/2018  Today's Provider: Margaretann Loveless, PA-C   Chief Complaint  Patient presents with  . Follow-up    DMT2   Subjective:    HPI Patient here today for Diabetes Mellitus Type 2 follow-up. -Current Symptoms:Polyuria and stable. -Cardiovascular risk factors:Advanced age (older than 57 for men, 46 for women) and Hypertension. -Current Diabetic Medications include:Metformin 500mg  BID. -Eye Exam: scheduled to go in two weeks per pt.She is followed closely because she has Cataract. -Sugar Levels at home:not being checked   Lab Results  Component Value Date   HGBA1C 6.8 (A) 06/21/2018   HGBA1C 6.9 12/28/2017   HGBA1C 7.6 09/21/2017    Wt Readings from Last 3 Encounters:  06/21/18 115 lb 3.2 oz (52.3 kg)  02/08/18 116 lb (52.6 kg)  12/28/17 114 lb (51.7 kg)   ------------------------------------------------------------------------      Allergies  Allergen Reactions  . Tetracycline     Roof of mouth broke out.     Current Outpatient Medications:  .  ALPRAZolam (XANAX) 0.5 MG tablet, Take 0.5-1 tablets (0.25-0.5 mg total) by mouth 2 (two) times daily as needed for anxiety., Disp: 60 tablet, Rfl: 5 .  aspirin 81 MG tablet, Take 81 mg by mouth daily., Disp: , Rfl:  .  augmented betamethasone dipropionate (DIPROLENE-AF) 0.05 % cream, Apply topically 2 (two) times daily., Disp: 50 g, Rfl: 0 .  bisoprolol-hydrochlorothiazide (ZIAC) 10-6.25 MG tablet, Take 1 tablet by mouth daily., Disp: 90 tablet, Rfl: 3 .  Boswellia-Glucosamine-Vit D (GLUCOSAMINE COMPLEX PO), Take by mouth daily as needed. 1500, Disp: , Rfl:  .  cholecalciferol (VITAMIN D) 1000 UNITS tablet, Take 1,000 Units by mouth. , Disp: , Rfl:  .  glucose blood test strip, Use to test blood sugar once a day.  DX: E11.9., Disp: 100 each, Rfl: 3 .  metFORMIN (GLUCOPHAGE) 500 MG tablet, Take 1 tablet (500 mg total) by  mouth 2 (two) times daily., Disp: 180 tablet, Rfl: 3 .  OMEGA-3 FATTY ACIDS PO, Take by mouth. , Disp: , Rfl:  .  triamcinolone cream (KENALOG) 0.1 %, , Disp: , Rfl:  .  vitamin B-12 (CYANOCOBALAMIN) 1000 MCG tablet, Take 1 tablet (1,000 mcg total) by mouth daily., Disp: 1 tablet, Rfl: 0 .  VITAMIN E-1000 PO, Take by mouth. , Disp: , Rfl:   Review of Systems  Eyes: Negative for visual disturbance.  Cardiovascular: Negative for chest pain, palpitations and leg swelling.  Endocrine: Positive for polyuria. Negative for polydipsia.    Social History   Tobacco Use  . Smoking status: Never Smoker  . Smokeless tobacco: Never Used  Substance Use Topics  . Alcohol use: Yes    Comment: occasional   Objective:   BP (!) 158/80 (BP Location: Left Arm, Patient Position: Sitting, Cuff Size: Normal)   Pulse (!) 53   Temp 99.2 F (37.3 C) (Oral)   Resp 16   Wt 115 lb 3.2 oz (52.3 kg)   SpO2 98%   BMI 25.83 kg/m  Vitals:   06/21/18 1324  BP: (!) 158/80  Pulse: (!) 53  Resp: 16  Temp: 99.2 F (37.3 C)  TempSrc: Oral  SpO2: 98%  Weight: 115 lb 3.2 oz (52.3 kg)     Physical Exam  Constitutional: She appears well-developed and well-nourished. No distress.  Neck: Normal range of motion. Neck supple.  Cardiovascular:  Normal rate, regular rhythm and normal heart sounds. Exam reveals no gallop and no friction rub.  No murmur heard. Pulmonary/Chest: Effort normal and breath sounds normal. No respiratory distress. She has no wheezes. She has no rales.  Musculoskeletal: She exhibits no edema.  Skin: She is not diaphoretic.  Vitals reviewed.      Assessment & Plan:     1. Type 2 diabetes mellitus without complication, without long-term current use of insulin (HCC) A1c stable at 6.8. Continue metformin. I will see her back in 3 months.  - POCT glycosylated hemoglobin (Hb A1C)  2. Intrinsic eczema Stable. Diagnosis pulled for medication refill. Continue current medical treatment  plan. - triamcinolone cream (KENALOG) 0.1 %; Apply topically 2 (two) times daily.  Dispense: 454 g; Refill: 0  3. Anxiety, generalized Stable. Diagnosis pulled for medication refill. Continue current medical treatment plan. - ALPRAZolam (XANAX) 0.5 MG tablet; Take 0.5-1 tablets (0.25-0.5 mg total) by mouth 2 (two) times daily as needed for anxiety.  Dispense: 60 tablet; Refill: 5  4. History of CVA with residual deficit Patient in need of PT due to deconditioning and fall risk. She refuses home health PT. Does not have ride for outpatient PT. Will call if she finds someone to take her.   5. Left arm weakness See above medical treatment plan.       Margaretann LovelessJennifer M Odai Wimmer, PA-C  [facility]

## 2018-07-05 LAB — HM DIABETES EYE EXAM

## 2018-07-10 ENCOUNTER — Encounter: Payer: Self-pay | Admitting: Physician Assistant

## 2018-10-04 ENCOUNTER — Ambulatory Visit: Payer: Self-pay | Admitting: Physician Assistant

## 2018-10-11 ENCOUNTER — Ambulatory Visit (INDEPENDENT_AMBULATORY_CARE_PROVIDER_SITE_OTHER): Payer: Medicare Other | Admitting: Physician Assistant

## 2018-10-11 ENCOUNTER — Encounter: Payer: Self-pay | Admitting: Physician Assistant

## 2018-10-11 VITALS — BP 140/80 | HR 62 | Temp 98.3°F | Wt 119.0 lb

## 2018-10-11 DIAGNOSIS — L2084 Intrinsic (allergic) eczema: Secondary | ICD-10-CM

## 2018-10-11 DIAGNOSIS — F411 Generalized anxiety disorder: Secondary | ICD-10-CM

## 2018-10-11 DIAGNOSIS — E119 Type 2 diabetes mellitus without complications: Secondary | ICD-10-CM | POA: Diagnosis not present

## 2018-10-11 LAB — POCT GLYCOSYLATED HEMOGLOBIN (HGB A1C)
Est. average glucose Bld gHb Est-mCnc: 151
Hemoglobin A1C: 6.9 % — AB (ref 4.0–5.6)

## 2018-10-11 MED ORDER — PREDNISONE 5 MG PO TABS: 5.0000 mg | ORAL_TABLET | Freq: Two times a day (BID) | ORAL | 0 refills | Status: DC

## 2018-10-11 MED ORDER — SERTRALINE HCL 25 MG PO TABS: 25.0000 mg | ORAL_TABLET | Freq: Every day | ORAL | 1 refills | Status: DC

## 2018-10-11 MED ORDER — TRIAMCINOLONE ACETONIDE 0.1 % EX CREA: TOPICAL_CREAM | Freq: Two times a day (BID) | CUTANEOUS | 0 refills | Status: DC

## 2018-10-11 NOTE — Progress Notes (Signed)
Patient: Sierra Lloyd Female    DOB: 06/02/1934   84 y.o.   MRN: [id number] Visit Date: 10/11/2018  Today's Provider: Margaretann LovelessJennifer M Jahlon Baines, PA-C   Chief Complaint  Patient presents with  . Follow-up    T2DM   Subjective:    HPI Diabetes Mellitus Type II, Follow-up:  Patient here for follow-up of Type 2 diabetes mellitus.  Current symptoms/problems include none and have been stable. Cardiovascular risk factors: advanced age (older than 6955 for men, 7365 for women), diabetes mellitus and hypertension Current diabetic medications include Metformin 500 BID.   Eye exam current (within one year): yes 05/2018 Current monitoring regimen: office lab tests - 3-6 months Home blood sugar records: 130's Any episodes of hypoglycemia? no  Eczema: Per patient she is doing good and still using the creams.    Allergies  Allergen Reactions  . Tetracycline     Roof of mouth broke out.     Current Outpatient Medications:  .  ALPRAZolam (XANAX) 0.5 MG tablet, Take 0.5-1 tablets (0.25-0.5 mg total) by mouth 2 (two) times daily as needed for anxiety., Disp: 60 tablet, Rfl: 5 .  aspirin 81 MG tablet, Take 81 mg by mouth daily., Disp: , Rfl:  .  augmented betamethasone dipropionate (DIPROLENE-AF) 0.05 % cream, Apply topically 2 (two) times daily., Disp: 50 g, Rfl: 0 .  bisoprolol-hydrochlorothiazide (ZIAC) 10-6.25 MG tablet, Take 1 tablet by mouth daily., Disp: 90 tablet, Rfl: 3 .  Boswellia-Glucosamine-Vit D (GLUCOSAMINE COMPLEX PO), Take by mouth daily as needed. 1500, Disp: , Rfl:  .  cholecalciferol (VITAMIN D) 1000 UNITS tablet, Take 1,000 Units by mouth. , Disp: , Rfl:  .  glucose blood test strip, Use to test blood sugar once a day.  DX: E11.9., Disp: 100 each, Rfl: 3 .  metFORMIN (GLUCOPHAGE) 500 MG tablet, Take 1 tablet (500 mg total) by mouth 2 (two) times daily., Disp: 180 tablet, Rfl: 3 .  OMEGA-3 FATTY ACIDS PO, Take by mouth. , Disp: , Rfl:  .  triamcinolone cream (KENALOG)  0.1 %, Apply topically 2 (two) times daily., Disp: 454 g, Rfl: 0 .  vitamin B-12 (CYANOCOBALAMIN) 1000 MCG tablet, Take 1 tablet (1,000 mcg total) by mouth daily., Disp: 1 tablet, Rfl: 0 .  VITAMIN E-1000 PO, Take by mouth. , Disp: , Rfl:   Review of Systems  Constitutional: Negative.   Respiratory: Negative.   Cardiovascular: Negative.   Gastrointestinal: Negative.   Skin: Positive for rash.  Neurological: Positive for weakness.  Psychiatric/Behavioral: The patient is nervous/anxious.     Social History   Tobacco Use  . Smoking status: Never Smoker  . Smokeless tobacco: Never Used  Substance Use Topics  . Alcohol use: Yes    Comment: occasional   Objective:   BP 140/80 (BP Location: Left Arm, Patient Position: Sitting, Cuff Size: Normal)   Pulse 62   Temp 98.3 F (36.8 C) (Oral)   Wt 119 lb (54 kg)   SpO2 97%   BMI 26.68 kg/m  Vitals:   10/11/18 1446  BP: 140/80  Pulse: 62  Temp: 98.3 F (36.8 C)  TempSrc: Oral  SpO2: 97%  Weight: 119 lb (54 kg)     Physical Exam  Constitutional: She appears well-developed and well-nourished. No distress.  Neck: Normal range of motion. Neck supple.  Cardiovascular: Normal rate, regular rhythm and normal heart sounds. Exam reveals no gallop and no friction rub.  No murmur heard. Pulmonary/Chest: Effort normal and  breath sounds normal. No respiratory distress. She has no wheezes. She has no rales.  Skin: She is not diaphoretic.  Erythematous patches with dry, flaky skin with lichenification  Psychiatric: Her mood appears anxious.  Vitals reviewed.       Assessment & Plan:     1. Type 2 diabetes mellitus without complication, without long-term current use of insulin (HCC) A1c stable at 6.9. Continue metformin 500mg  BID.  - POCT glycosylated hemoglobin (Hb A1C)  2. Intrinsic eczema Stable. Diagnosis pulled for medication refill. Continue current medical treatment plan. - triamcinolone cream (KENALOG) 0.1 %; Apply  topically 2 (two) times daily.  Dispense: 454 g; Refill: 0 - predniSONE (DELTASONE) 5 MG tablet; Take 1 tablet (5 mg total) by mouth 2 (two) times daily with a meal.  Dispense: 40 tablet; Refill: 0  3. Anxiety, generalized Worsening. Patient states she is scared to be alone for the holidays. Will add sertraline for her to start at bedtime. Continue alprazolam 0.25 mg (takes 1/2 tab QID). I will see her back in 4 weeks.  - sertraline (ZOLOFT) 25 MG tablet; Take 1 tablet (25 mg total) by mouth at bedtime.  Dispense: 90 tablet; Refill: 1       Margaretann Loveless, PA-C  [facility]

## 2018-10-11 NOTE — Patient Instructions (Signed)

## 2018-11-08 ENCOUNTER — Encounter: Payer: Self-pay | Admitting: Physician Assistant

## 2018-11-08 ENCOUNTER — Ambulatory Visit (INDEPENDENT_AMBULATORY_CARE_PROVIDER_SITE_OTHER): Payer: Medicare Other | Admitting: Physician Assistant

## 2018-11-08 VITALS — BP 195/72 | HR 58 | Temp 97.8°F | Resp 16 | Wt 119.0 lb

## 2018-11-08 DIAGNOSIS — F4001 Agoraphobia with panic disorder: Secondary | ICD-10-CM | POA: Diagnosis not present

## 2018-11-08 DIAGNOSIS — F411 Generalized anxiety disorder: Secondary | ICD-10-CM | POA: Diagnosis not present

## 2018-11-08 NOTE — Progress Notes (Signed)
Patient: Sierra Lloyd Residesatricia A Goth Female    DOB: 12/18/1933   84 y.o.   MRN: [id number] Visit Date: 11/08/2018  Today's Provider: Margaretann LovelessJennifer M Burnette, PA-C   Chief Complaint  Patient presents with  . Follow-up   Subjective:     HPI   Follow up for Anxiety  The patient was last seen for this 4 weeks ago. Changes made at last visit include start sertraline at bedtime continue alprazolam.  She reports fair compliance with treatment. She feels that condition is Improved. She is not having side effects. Patient reports she only took for a days. Had some insomnia.  ------------------------------------------------------------------------------------   Allergies  Allergen Reactions  . Tetracycline     Roof of mouth broke out.     Current Outpatient Medications:  .  ALPRAZolam (XANAX) 0.5 MG tablet, Take 0.5-1 tablets (0.25-0.5 mg total) by mouth 2 (two) times daily as needed for anxiety., Disp: 60 tablet, Rfl: 5 .  aspirin 81 MG tablet, Take 81 mg by mouth daily., Disp: , Rfl:  .  augmented betamethasone dipropionate (DIPROLENE-AF) 0.05 % cream, Apply topically 2 (two) times daily., Disp: 50 g, Rfl: 0 .  bisoprolol-hydrochlorothiazide (ZIAC) 10-6.25 MG tablet, Take 1 tablet by mouth daily., Disp: 90 tablet, Rfl: 3 .  Boswellia-Glucosamine-Vit D (GLUCOSAMINE COMPLEX PO), Take by mouth daily as needed. 1500, Disp: , Rfl:  .  cholecalciferol (VITAMIN D) 1000 UNITS tablet, Take 1,000 Units by mouth. , Disp: , Rfl:  .  glucose blood test strip, Use to test blood sugar once a day.  DX: E11.9., Disp: 100 each, Rfl: 3 .  metFORMIN (GLUCOPHAGE) 500 MG tablet, Take 1 tablet (500 mg total) by mouth 2 (two) times daily., Disp: 180 tablet, Rfl: 3 .  OMEGA-3 FATTY ACIDS PO, Take by mouth. , Disp: , Rfl:  .  predniSONE (DELTASONE) 5 MG tablet, Take 1 tablet (5 mg total) by mouth 2 (two) times daily with a meal., Disp: 40 tablet, Rfl: 0 .  triamcinolone cream (KENALOG) 0.1 %, Apply topically 2  (two) times daily., Disp: 454 g, Rfl: 0 .  vitamin B-12 (CYANOCOBALAMIN) 1000 MCG tablet, Take 1 tablet (1,000 mcg total) by mouth daily., Disp: 1 tablet, Rfl: 0 .  sertraline (ZOLOFT) 25 MG tablet, Take 1 tablet (25 mg total) by mouth at bedtime. (Patient not taking: Reported on 11/08/2018), Disp: 90 tablet, Rfl: 1 .  VITAMIN E-1000 PO, Take by mouth. , Disp: , Rfl:   Review of Systems  Constitutional: Negative.   Respiratory: Negative.   Cardiovascular: Negative.   Gastrointestinal: Negative.   Psychiatric/Behavioral: Positive for dysphoric mood. The patient is nervous/anxious.     Social History   Tobacco Use  . Smoking status: Never Smoker  . Smokeless tobacco: Never Used  Substance Use Topics  . Alcohol use: Yes    Comment: occasional      Objective:   BP (!) 195/72 (BP Location: Left Arm, Patient Position: Sitting, Cuff Size: Normal)   Pulse (!) 58   Temp 97.8 F (36.6 C) (Oral)   Resp 16   Wt 119 lb (54 kg)   SpO2 98%   BMI 26.68 kg/m  Vitals:   11/08/18 1416  BP: (!) 195/72  Pulse: (!) 58  Resp: 16  Temp: 97.8 F (36.6 C)  TempSrc: Oral  SpO2: 98%  Weight: 119 lb (54 kg)     Physical Exam Vitals signs reviewed.  Constitutional:      Appearance: She is  well-developed.  HENT:     Head: Normocephalic and atraumatic.  Neck:     Musculoskeletal: Normal range of motion and neck supple.  Pulmonary:     Effort: Pulmonary effort is normal. No respiratory distress.  Psychiatric:        Mood and Affect: Mood is anxious and depressed.        Speech: Speech normal.        Behavior: Behavior normal.        Thought Content: Thought content normal.        Judgment: Judgment normal.         Assessment & Plan    1. GAD (generalized anxiety disorder) Discussed trying sertraline again, but taking in the morning instead. May continue alprazolam as prescribed. I will see her back in 2 months for her T2DM check and see if she is still taking sertraline. Call if  worsening.   2. Panic disorder with agoraphobia See above medical treatment plan.     Margaretann Loveless, PA-C  [facility]

## 2018-11-08 NOTE — Patient Instructions (Signed)
Do exercises 3 sets of 5-10 reps Do stretches and try to hold for 10-15 seconds  Shoulder Exercises Ask your health care provider which exercises are safe for you. Do exercises exactly as told by your health care provider and adjust them as directed. It is normal to feel mild stretching, pulling, tightness, or discomfort as you do these exercises, but you should stop right away if you feel sudden pain or your pain gets worse.Do not begin these exercises until told by your health care provider. Range of Motion Exercises        These exercises warm up your muscles and joints and improve the movement and flexibility of your shoulder. These exercises also help to relieve pain, numbness, and tingling. These exercises involve stretching your injured shoulder directly. Exercise A: Pendulum 1. Stand near a wall or a surface that you can hold onto for balance. 2. Bend at the waist and let your left / right arm hang straight down. Use your other arm to support you. Keep your back straight and do not lock your knees. 3. Relax your left / right arm and shoulder muscles, and move your hips and your trunk so your left / right arm swings freely. Your arm should swing because of the motion of your body, not because you are using your arm or shoulder muscles. 4. Keep moving your body so your arm swings in the following directions, as told by your health care provider: ? Side to side. ? Forward and backward. ? In clockwise and counterclockwise circles. 5. Continue each motion for __________ seconds, or for as long as told by your health care provider. 6. Slowly return to the starting position. Repeat __________ times. Complete this exercise __________ times a day. Exercise B:Flexion, Standing 1. Stand and hold a broomstick, a cane, or a similar object. Place your hands a little more than shoulder-width apart on the object. Your left / right hand should be palm-up, and your other hand should be  palm-down. 2. Keep your elbow straight and keep your shoulder muscles relaxed. Push the stick down with your healthy arm to raise your left / right arm in front of your body, and then over your head until you feel a stretch in your shoulder. ? Avoid shrugging your shoulder while you raise your arm. Keep your shoulder blade tucked down toward the middle of your back. 3. Hold for __________ seconds. 4. Slowly return to the starting position. Repeat __________ times. Complete this exercise __________ times a day. Exercise C: Abduction, Standing 1. Stand and hold a broomstick, a cane, or a similar object. Place your hands a little more than shoulder-width apart on the object. Your left / right hand should be palm-up, and your other hand should be palm-down. 2. While keeping your elbow straight and your shoulder muscles relaxed, push the stick across your body toward your left / right side. Raise your left / right arm to the side of your body and then over your head until you feel a stretch in your shoulder. ? Do not raise your arm above shoulder height, unless your health care provider tells you to do that. ? Avoid shrugging your shoulder while you raise your arm. Keep your shoulder blade tucked down toward the middle of your back. 3. Hold for __________ seconds. 4. Slowly return to the starting position. Repeat __________ times. Complete this exercise __________ times a day. Exercise D:Internal Rotation 1. Place your left / right hand behind your back, palm-up. 2. Use  your other hand to dangle an exercise band, a towel, or a similar object over your shoulder. Grasp the band with your left / right hand so you are holding onto both ends. 3. Gently pull up on the band until you feel a stretch in the front of your left / right shoulder. ? Avoid shrugging your shoulder while you raise your arm. Keep your shoulder blade tucked down toward the middle of your back. 4. Hold for __________  seconds. 5. Release the stretch by letting go of the band and lowering your hands. Repeat __________ times. Complete this exercise __________ times a day. Stretching Exercises  These exercises warm up your muscles and joints and improve the movement and flexibility of your shoulder. These exercises also help to relieve pain, numbness, and tingling. These exercises are done using your healthy shoulder to help stretch the muscles of your injured shoulder. Exercise E: Research officer, political party (External Rotation and Abduction) 1. Stand in a doorway with one of your feet slightly in front of the other. This is called a staggered stance. If you cannot reach your forearms to the door frame, stand facing a corner of a room. 2. Choose one of the following positions as told by your health care provider: ? Place your hands and forearms on the door frame above your head. ? Place your hands and forearms on the door frame at the height of your head. ? Place your hands on the door frame at the height of your elbows. 3. Slowly move your weight onto your front foot until you feel a stretch across your chest and in the front of your shoulders. Keep your head and chest upright and keep your abdominal muscles tight. 4. Hold for __________ seconds. 5. To release the stretch, shift your weight to your back foot. Repeat __________ times. Complete this stretch __________ times a day. Exercise F:Extension, Standing 1. Stand and hold a broomstick, a cane, or a similar object behind your back. ? Your hands should be a little wider than shoulder-width apart. ? Your palms should face away from your back. 2. Keeping your elbows straight and keeping your shoulder muscles relaxed, move the stick away from your body until you feel a stretch in your shoulder. ? Avoid shrugging your shoulders while you move the stick. Keep your shoulder blade tucked down toward the middle of your back. 3. Hold for __________ seconds. 4. Slowly return to  the starting position. Repeat __________ times. Complete this exercise __________ times a day. Strengthening Exercises           These exercises build strength and endurance in your shoulder. Endurance is the ability to use your muscles for a long time, even after they get tired. Exercise G:External Rotation 1. Sit in a stable chair without armrests. 2. Secure an exercise band at elbow height on your left / right side. 3. Place a soft object, such as a folded towel or a small pillow, between your left / right upper arm and your body to move your elbow a few inches away (about 10 cm) from your side. 4. Hold the end of the band so it is tight and there is no slack. 5. Keeping your elbow pressed against the soft object, move your left / right forearm out, away from your abdomen. Keep your body steady so only your forearm moves. 6. Hold for __________ seconds. 7. Slowly return to the starting position. Repeat __________ times. Complete this exercise __________ times a day. Exercise H:Shoulder Abduction  1. Sit in a stable chair without armrests, or stand. 2. Hold a __________ weight in your left / right hand, or hold an exercise band with both hands. 3. Start with your arms straight down and your left / right palm facing in, toward your body. 4. Slowly lift your left / right hand out to your side. Do not lift your hand above shoulder height unless your health care provider tells you that this is safe. ? Keep your arms straight. ? Avoid shrugging your shoulder while you do this movement. Keep your shoulder blade tucked down toward the middle of your back. 5. Hold for __________ seconds. 6. Slowly lower your arm, and return to the starting position. Repeat __________ times. Complete this exercise __________ times a day. Exercise I:Shoulder Extension 1. Sit in a stable chair without armrests, or stand. 2. Secure an exercise band to a stable object in front of you where it is at shoulder  height. 3. Hold one end of the exercise band in each hand. Your palms should face each other. 4. Straighten your elbows and lift your hands up to shoulder height. 5. Step back, away from the secured end of the exercise band, until the band is tight and there is no slack. 6. Squeeze your shoulder blades together as you pull your hands down to the sides of your thighs. Stop when your hands are straight down by your sides. Do not let your hands go behind your body. 7. Hold for __________ seconds. 8. Slowly return to the starting position. Repeat __________ times. Complete this exercise __________ times a day. Exercise J:Standing Shoulder Row 1. Sit in a stable chair without armrests, or stand. 2. Secure an exercise band to a stable object in front of you so it is at waist height. 3. Hold one end of the exercise band in each hand. Your palms should be in a thumbs-up position. 4. Bend each of your elbows to an "L" shape (about 90 degrees) and keep your upper arms at your sides. 5. Step back until the band is tight and there is no slack. 6. Slowly pull your elbows back behind you. 7. Hold for __________ seconds. 8. Slowly return to the starting position. Repeat __________ times. Complete this exercise __________ times a day. Exercise K:Shoulder Press-Ups 1. Sit in a stable chair that has armrests. Sit upright, with your feet flat on the floor. 2. Put your hands on the armrests so your elbows are bent and your fingers are pointing forward. Your hands should be about even with the sides of your body. 3. Push down on the armrests and use your arms to lift yourself off of the chair. Straighten your elbows and lift yourself up as much as you comfortably can. ? Move your shoulder blades down, and avoid letting your shoulders move up toward your ears. ? Keep your feet on the ground. As you get stronger, your feet should support less of your body weight as you lift yourself up. 4. Hold for __________  seconds. 5. Slowly lower yourself back into the chair. Repeat __________ times. Complete this exercise __________ times a day. Exercise L: Wall Push-Ups 1. Stand so you are facing a stable wall. Your feet should be about one arm-length away from the wall. 2. Lean forward and place your palms on the wall at shoulder height. 3. Keep your feet flat on the floor as you bend your elbows and lean forward toward the wall. 4. Hold for __________ seconds. 5. Straighten your  elbows to push yourself back to the starting position. Repeat __________ times. Complete this exercise __________ times a day. This information is not intended to replace advice given to you by your health care provider. Make sure you discuss any questions you have with your health care provider. Document Released: 09/22/2005 Document Revised: 03/14/2018 Document Reviewed: 07/20/2015 Elsevier Interactive Patient Education  2019 [employer].

## 2018-11-23 ENCOUNTER — Other Ambulatory Visit: Payer: Self-pay | Admitting: Physician Assistant

## 2018-11-23 DIAGNOSIS — F411 Generalized anxiety disorder: Secondary | ICD-10-CM

## 2018-12-27 ENCOUNTER — Other Ambulatory Visit: Payer: Self-pay | Admitting: Family Medicine

## 2018-12-27 DIAGNOSIS — F411 Generalized anxiety disorder: Secondary | ICD-10-CM

## 2019-01-10 ENCOUNTER — Encounter: Payer: Self-pay | Admitting: Physician Assistant

## 2019-01-10 ENCOUNTER — Ambulatory Visit (INDEPENDENT_AMBULATORY_CARE_PROVIDER_SITE_OTHER): Payer: Medicare Other | Admitting: Physician Assistant

## 2019-01-10 VITALS — BP 177/82 | HR 62 | Temp 98.2°F | Resp 16 | Wt 122.4 lb

## 2019-01-10 DIAGNOSIS — I10 Essential (primary) hypertension: Secondary | ICD-10-CM

## 2019-01-10 DIAGNOSIS — F411 Generalized anxiety disorder: Secondary | ICD-10-CM

## 2019-01-10 DIAGNOSIS — E119 Type 2 diabetes mellitus without complications: Secondary | ICD-10-CM

## 2019-01-10 DIAGNOSIS — L2084 Intrinsic (allergic) eczema: Secondary | ICD-10-CM

## 2019-01-10 LAB — POCT GLYCOSYLATED HEMOGLOBIN (HGB A1C)
ESTIMATED AVERAGE GLUCOSE: 194
HEMOGLOBIN A1C: 8.4 % — AB (ref 4.0–5.6)

## 2019-01-10 MED ORDER — TRIAMCINOLONE ACETONIDE 0.1 % EX CREA: TOPICAL_CREAM | Freq: Two times a day (BID) | CUTANEOUS | 0 refills | Status: DC

## 2019-01-10 MED ORDER — BISOPROLOL-HYDROCHLOROTHIAZIDE 10-6.25 MG PO TABS: 1.0000 | ORAL_TABLET | Freq: Every day | ORAL | 3 refills | Status: DC

## 2019-01-10 MED ORDER — BETAMETHASONE DIPROPIONATE AUG 0.05 % EX CREA: TOPICAL_CREAM | Freq: Two times a day (BID) | CUTANEOUS | 0 refills | Status: DC

## 2019-01-10 MED ORDER — ALPRAZOLAM 0.5 MG PO TABS: ORAL_TABLET | ORAL | 5 refills | Status: DC

## 2019-01-10 MED ORDER — METFORMIN HCL 500 MG PO TABS: 500.0000 mg | ORAL_TABLET | Freq: Two times a day (BID) | ORAL | 3 refills | Status: DC

## 2019-01-10 NOTE — Patient Instructions (Signed)
Gabapentin capsules or tablets What is this medicine? GABAPENTIN (GA ba pen tin) is used to control seizures in certain types of epilepsy. It is also used to treat certain types of nerve pain. This medicine may be used for other purposes; ask your health care provider or pharmacist if you have questions. COMMON BRAND NAME(S): Active-PAC with Gabapentin, Gabarone, Neurontin What should I tell my health care provider before I take this medicine? They need to know if you have any of these conditions: -kidney disease -suicidal thoughts, plans, or attempt; a previous suicide attempt by you or a family member -an unusual or allergic reaction to gabapentin, other medicines, foods, dyes, or preservatives -pregnant or trying to get pregnant -breast-feeding How should I use this medicine? Take this medicine by mouth with a glass of water. Follow the directions on the prescription label. You can take it with or without food. If it upsets your stomach, take it with food. Take your medicine at regular intervals. Do not take it more often than directed. Do not stop taking except on your doctor's advice. If you are directed to break the 600 or 800 mg tablets in half as part of your dose, the extra half tablet should be used for the next dose. If you have not used the extra half tablet within 28 days, it should be thrown away. A special MedGuide will be given to you by the pharmacist with each prescription and refill. Be sure to read this information carefully each time. Talk to your pediatrician regarding the use of this medicine in children. While this drug may be prescribed for children as young as 3 years for selected conditions, precautions do apply. Overdosage: If you think you have taken too much of this medicine contact a poison control center or emergency room at once. NOTE: This medicine is only for you. Do not share this medicine with others. What if I miss a dose? If you miss a dose, take it as soon  as you can. If it is almost time for your next dose, take only that dose. Do not take double or extra doses. What may interact with this medicine? Do not take this medicine with any of the following medications: -other gabapentin products This medicine may also interact with the following medications: -alcohol -antacids -antihistamines for allergy, cough and cold -certain medicines for anxiety or sleep -certain medicines for depression or psychotic disturbances -homatropine; hydrocodone -naproxen -narcotic medicines (opiates) for pain -phenothiazines like chlorpromazine, mesoridazine, prochlorperazine, thioridazine This list may not describe all possible interactions. Give your health care provider a list of all the medicines, herbs, non-prescription drugs, or dietary supplements you use. Also tell them if you smoke, drink alcohol, or use illegal drugs. Some items may interact with your medicine. What should I watch for while using this medicine? Visit your doctor or health care professional for regular checks on your progress. You may want to keep a record at home of how you feel your condition is responding to treatment. You may want to share this information with your doctor or health care professional at each visit. You should contact your doctor or health care professional if your seizures get worse or if you have any new types of seizures. Do not stop taking this medicine or any of your seizure medicines unless instructed by your doctor or health care professional. Stopping your medicine suddenly can increase your seizures or their severity. Wear a medical identification bracelet or chain if you are taking this medicine for   seizures, and carry a card that lists all your medications. You may get drowsy, dizzy, or have blurred vision. Do not drive, use machinery, or do anything that needs mental alertness until you know how this medicine affects you. To reduce dizzy or fainting spells, do not  sit or stand up quickly, especially if you are an older patient. Alcohol can increase drowsiness and dizziness. Avoid alcoholic drinks. Your mouth may get dry. Chewing sugarless gum or sucking hard candy, and drinking plenty of water will help. The use of this medicine may increase the chance of suicidal thoughts or actions. Pay special attention to how you are responding while on this medicine. Any worsening of mood, or thoughts of suicide or dying should be reported to your health care professional right away. Women who become pregnant while using this medicine may enroll in the [place] Antiepileptic Drug Pregnancy Registry by calling 1-888-233-2334. This registry collects information about the safety of antiepileptic drug use during pregnancy. What side effects may I notice from receiving this medicine? Side effects that you should report to your doctor or health care professional as soon as possible: -allergic reactions like skin rash, itching or hives, swelling of the face, lips, or tongue -worsening of mood, thoughts or actions of suicide or dying Side effects that usually do not require medical attention (report to your doctor or health care professional if they continue or are bothersome): -constipation -difficulty walking or controlling muscle movements -dizziness -nausea -slurred speech -tiredness -tremors -weight gain This list may not describe all possible side effects. Call your doctor for medical advice about side effects. You may report side effects to FDA at 1-800-FDA-1088. Where should I keep my medicine? Keep out of reach of children. This medicine may cause accidental overdose and death if it taken by other adults, children, or pets. Mix any unused medicine with a substance like cat litter or coffee grounds. Then throw the medicine away in a sealed container like a sealed bag or a coffee can with a lid. Do not use the medicine after the expiration date. Store at room  temperature between 15 and 30 degrees C (59 and 86 degrees F). NOTE: This sheet is a summary. It may not cover all possible information. If you have questions about this medicine, talk to your doctor, pharmacist, or health care provider.  2019 Elsevier/Gold Standard (2018-04-13 13:21:44)  

## 2019-01-10 NOTE — Progress Notes (Signed)
Patient: Sierra Lloyd Female    DOB: [birth year]   85 y.o.   MRN: [id number] Visit Date: 01/10/2019  Today's Provider: Margaretann Loveless, PA-C   Chief Complaint  Patient presents with  . Follow-up    T2DM and GAD   Subjective:       HPI  Diabetes Mellitus Type II, Follow-up: Patient here for follow-up of Type 2 diabetes mellitus.  Current symptoms/problems include none and have been stable. Cardiovascular risk factors: advanced age (older than 55 for men, 44 for women) and diabetes mellitus Current diabetic medications include Metformin 500mg  BID Weight trend: stable  Current diet: in general, a "healthy" diet   Home blood sugar records: not checking   Lab Results  Component Value Date   HGBA1C 8.4 (A) 01/10/2019   HGBA1C 6.9 (A) 10/11/2018   HGBA1C 6.8 (A) 06/21/2018    GAD (generalized anxiety disorder),follow-up: It was discussed 2-3 weeks trying sertraline again, but taking in the morning instead to see if Insomnia got better. She reports that she is sleeping better. Reports that her anxiety is up because she is very nervous because she is having dental procedure done next week. Stopped the sertraline on her own, states she was too scared to take it again.     Allergies  Allergen Reactions  . Tetracycline     Roof of mouth broke out.     Current Outpatient Medications:  .  ALPRAZolam (XANAX) 0.5 MG tablet, TAKE 1/2 TO 1 TABLET BY MOUTH TWICE DAILY AS NEEDED ANXIETY, Disp: 60 tablet, Rfl: 5 .  aspirin 81 MG tablet, Take 81 mg by mouth daily., Disp: , Rfl:  .  augmented betamethasone dipropionate (DIPROLENE-AF) 0.05 % cream, Apply topically 2 (two) times daily., Disp: 50 g, Rfl: 0 .  bisoprolol-hydrochlorothiazide (ZIAC) 10-6.25 MG tablet, Take 1 tablet by mouth daily., Disp: 90 tablet, Rfl: 3 .  Boswellia-Glucosamine-Vit D (GLUCOSAMINE COMPLEX PO), Take by mouth daily as needed. 1500, Disp: , Rfl:  .  metFORMIN (GLUCOPHAGE) 500 MG tablet, Take 1 tablet  (500 mg total) by mouth 2 (two) times daily., Disp: 180 tablet, Rfl: 3 .  OMEGA-3 FATTY ACIDS PO, Take by mouth. , Disp: , Rfl:  .  sertraline (ZOLOFT) 25 MG tablet, Take 1 tablet (25 mg total) by mouth at bedtime., Disp: 90 tablet, Rfl: 1 .  triamcinolone cream (KENALOG) 0.1 %, Apply topically 2 (two) times daily., Disp: 454 g, Rfl: 0 .  vitamin B-12 (CYANOCOBALAMIN) 1000 MCG tablet, Take 1 tablet (1,000 mcg total) by mouth daily., Disp: 1 tablet, Rfl: 0 .  VITAMIN E-1000 PO, Take by mouth. , Disp: , Rfl:  .  cholecalciferol (VITAMIN D) 1000 UNITS tablet, Take 1,000 Units by mouth. , Disp: , Rfl:  .  glucose blood test strip, Use to test blood sugar once a day.  DX: E11.9., Disp: 100 each, Rfl: 3 .  predniSONE (DELTASONE) 5 MG tablet, Take 1 tablet (5 mg total) by mouth 2 (two) times daily with a meal. (Patient not taking: Reported on 01/10/2019), Disp: 40 tablet, Rfl: 0  Review of Systems  Constitutional: Negative.   Respiratory: Negative.   Cardiovascular: Negative.   Endocrine: Negative.   Skin: Positive for rash.  Neurological: Positive for weakness and numbness.  Psychiatric/Behavioral: The patient is nervous/anxious.     Social History   Tobacco Use  . Smoking status: Never Smoker  . Smokeless tobacco: Never Used  Substance Use Topics  . Alcohol use:  Yes    Comment: occasional      Objective:   BP (!) 177/82 (BP Location: Left Arm, Patient Position: Sitting, Cuff Size: Normal)   Pulse 62   Temp 98.2 F (36.8 C) (Oral)   Resp 16   Wt 122 lb 6.4 oz (55.5 kg)   BMI 27.44 kg/m  Vitals:   01/10/19 1428  BP: (!) 177/82  Pulse: 62  Resp: 16  Temp: 98.2 F (36.8 C)  TempSrc: Oral  Weight: 122 lb 6.4 oz (55.5 kg)     Physical Exam Vitals signs reviewed.  Constitutional:      General: She is not in acute distress.    Appearance: Normal appearance. She is well-developed and normal weight. She is not diaphoretic.  Neck:     Musculoskeletal: Normal range of motion  and neck supple.  Cardiovascular:     Rate and Rhythm: Normal rate and regular rhythm.     Heart sounds: Normal heart sounds. No murmur. No friction rub. No gallop.   Pulmonary:     Effort: Pulmonary effort is normal. No respiratory distress.     Breath sounds: Normal breath sounds. No wheezing or rales.  Skin:    Comments: Eczema still present on hands today, but not severe as it has been  Neurological:     Mental Status: She is alert.  Psychiatric:        Mood and Affect: Mood is anxious and depressed. Affect is tearful.         Assessment & Plan    1. Type 2 diabetes mellitus without complication, without long-term current use of insulin (HCC) A1c stable. Continue current medical treatment plan. I will see her back in 3 months to recheck A1c. - POCT glycosylated hemoglobin (Hb A1C) - metFORMIN (GLUCOPHAGE) 500 MG tablet; Take 1 tablet (500 mg total) by mouth 2 (two) times daily.  Dispense: 180 tablet; Refill: 3  2. Anxiety, generalized Stable. Diagnosis pulled for medication refill. Continue current medical treatment plan. Patient stopped sertraline. Does not wish to start anything else at this time.  - ALPRAZolam (XANAX) 0.5 MG tablet; TAKE 1/2 TO 1 TABLET BY MOUTH TWICE DAILY AS NEEDED ANXIETY  Dispense: 60 tablet; Refill: 5  3. Intrinsic eczema Stable. Diagnosis pulled for medication refill. Continue current medical treatment plan. - augmented betamethasone dipropionate (DIPROLENE-AF) 0.05 % cream; Apply topically 2 (two) times daily.  Dispense: 50 g; Refill: 0 - triamcinolone cream (KENALOG) 0.1 %; Apply topically 2 (two) times daily.  Dispense: 454 g; Refill: 0  4. Essential hypertension Stable. Diagnosis pulled for medication refill. Continue current medical treatment plan. - bisoprolol-hydrochlorothiazide (ZIAC) 10-6.25 MG tablet; Take 1 tablet by mouth daily.  Dispense: 90 tablet; Refill: 3     Margaretann Loveless, PA-C  [facility]

## 2019-01-17 ENCOUNTER — Ambulatory Visit: Payer: Self-pay | Admitting: Physician Assistant

## 2019-01-19 ENCOUNTER — Emergency Department: Payer: Medicare Other

## 2019-01-19 ENCOUNTER — Other Ambulatory Visit: Payer: Self-pay

## 2019-01-19 ENCOUNTER — Emergency Department
Admission: EM | Admit: 2019-01-19 | Discharge: 2019-01-20 | Disposition: A | Discharge status: Home / Self Care | Payer: Medicare Other | Attending: Emergency Medicine | Admitting: Emergency Medicine

## 2019-01-19 ENCOUNTER — Encounter: Payer: Self-pay | Admitting: Emergency Medicine

## 2019-01-19 DIAGNOSIS — I10 Essential (primary) hypertension: Secondary | ICD-10-CM | POA: Diagnosis not present

## 2019-01-19 DIAGNOSIS — Z7982 Long term (current) use of aspirin: Secondary | ICD-10-CM | POA: Diagnosis not present

## 2019-01-19 DIAGNOSIS — R531 Weakness: Secondary | ICD-10-CM

## 2019-01-19 DIAGNOSIS — E119 Type 2 diabetes mellitus without complications: Secondary | ICD-10-CM | POA: Insufficient documentation

## 2019-01-19 DIAGNOSIS — Z7984 Long term (current) use of oral hypoglycemic drugs: Secondary | ICD-10-CM | POA: Diagnosis not present

## 2019-01-19 DIAGNOSIS — E860 Dehydration: Secondary | ICD-10-CM | POA: Diagnosis not present

## 2019-01-19 LAB — CBC WITH DIFFERENTIAL/PLATELET
Abs Immature Granulocytes: 0.15 10*3/uL — ABNORMAL HIGH (ref 0.00–0.07)
Basophils Absolute: 0 10*3/uL (ref 0.0–0.1)
Basophils Relative: 0 %
Eosinophils Absolute: 0 10*3/uL (ref 0.0–0.5)
Eosinophils Relative: 0 %
HCT: 35.4 % — ABNORMAL LOW (ref 36.0–46.0)
Hemoglobin: 12.1 g/dL (ref 12.0–15.0)
Immature Granulocytes: 1 %
Lymphocytes Relative: 5 %
Lymphs Abs: 1 10*3/uL (ref 0.7–4.0)
MCH: 29.4 pg (ref 26.0–34.0)
MCHC: 34.2 g/dL (ref 30.0–36.0)
MCV: 85.9 fL (ref 80.0–100.0)
MONO ABS: 1.1 10*3/uL — AB (ref 0.1–1.0)
Monocytes Relative: 6 %
Neutro Abs: 16 10*3/uL — ABNORMAL HIGH (ref 1.7–7.7)
Neutrophils Relative %: 88 %
Platelets: 174 10*3/uL (ref 150–400)
RBC: 4.12 MIL/uL (ref 3.87–5.11)
RDW: 12.5 % (ref 11.5–15.5)
WBC: 18.3 10*3/uL — ABNORMAL HIGH (ref 4.0–10.5)
nRBC: 0 % (ref 0.0–0.2)

## 2019-01-19 LAB — URINALYSIS, COMPLETE (UACMP) WITH MICROSCOPIC
BACTERIA UA: NONE SEEN
Bilirubin Urine: NEGATIVE
Glucose, UA: >=500 mg/dL — AB
Hgb urine dipstick: NEGATIVE
Ketones, ur: NEGATIVE mg/dL
Leukocytes,Ua: NEGATIVE
Nitrite: NEGATIVE
Protein, ur: NEGATIVE mg/dL
Specific Gravity, Urine: 1.015 (ref 1.005–1.030)
pH: 5 (ref 5.0–8.0)

## 2019-01-19 LAB — COMPREHENSIVE METABOLIC PANEL
ALT: 17 U/L (ref 0–44)
AST: 22 U/L (ref 15–41)
Albumin: 3.6 g/dL (ref 3.5–5.0)
Alkaline Phosphatase: 42 U/L (ref 38–126)
Anion gap: 11 (ref 5–15)
BUN: 19 mg/dL (ref 8–23)
CO2: 23 mmol/L (ref 22–32)
Calcium: 9.1 mg/dL (ref 8.9–10.3)
Chloride: 103 mmol/L (ref 98–111)
Creatinine, Ser: 0.87 mg/dL (ref 0.44–1.00)
GFR calc non Af Amer: >60 mL/min (ref >60)
Glucose, Bld: 286 mg/dL — ABNORMAL HIGH (ref 70–99)
Potassium: 3.5 mmol/L (ref 3.5–5.1)
Sodium: 137 mmol/L (ref 135–145)
Total Bilirubin: 0.8 mg/dL (ref 0.3–1.2)
Total Protein: 6.1 g/dL — ABNORMAL LOW (ref 6.5–8.1)

## 2019-01-19 LAB — TROPONIN I: Troponin I: <0.03 ng/mL (ref <0.03)

## 2019-01-19 MED ORDER — SODIUM CHLORIDE 0.9% FLUSH: 3.0000 mL | Freq: Once | INTRAVENOUS | Status: DC

## 2019-01-19 MED ORDER — SODIUM CHLORIDE 0.9 % IV SOLN: Freq: Once | INTRAVENOUS | Status: DC

## 2019-01-19 MED ORDER — ALPRAZOLAM 0.5 MG PO TABS
0.2500 mg | ORAL_TABLET | Freq: Once | ORAL | Status: AC
  Administered 2019-01-19: 0.25 mg via ORAL
  Filled 2019-01-19: qty 1

## 2019-01-19 MED ORDER — ACETAMINOPHEN 325 MG PO TABS
650.0000 mg | ORAL_TABLET | Freq: Once | ORAL | Status: AC
  Administered 2019-01-19: 650 mg via ORAL
  Filled 2019-01-19: qty 2

## 2019-01-19 MED ORDER — SODIUM CHLORIDE 0.9 % IV BOLUS
500.0000 mL | Freq: Once | INTRAVENOUS | Status: AC
  Administered 2019-01-19: 500 mL via INTRAVENOUS

## 2019-01-19 NOTE — ED Triage Notes (Signed)
Patient presents to Emergency Department via [facility] from home with complaints of fever and weakness.  EMS was called because pt had a tooth pulled on Wednesday and has been taking steroids since.  Pt reports being weak since not eating since noon, c/o pain in back of neck (chronic arthritis), and having chills.  History of HTN, DM2, anxiety, and arthritis

## 2019-01-19 NOTE — ED Notes (Signed)
Pt ambulated approx 6 feet to wheelchair, pt reports "I don't want to stay in the hospital", "I want to go home but I can't get there"

## 2019-01-19 NOTE — ED Notes (Signed)
Pt now longer requesting to stay in wheelchair, pt now moved to stretcher  Blanket provided, call bell light moved to left side as per request

## 2019-01-19 NOTE — ED Notes (Signed)
Pt removed from bedpan, pt wants to leave, dressed into clothes for discharge

## 2019-01-19 NOTE — ED Notes (Signed)
Patient transported to CT 

## 2019-01-19 NOTE — ED Notes (Signed)
Pt now has decided to stay in the hospital

## 2019-01-19 NOTE — ED Notes (Signed)
call bell light answered  Pt given pillow, thermostat adjusted, fluids given, lights dimmed,   Pt "I didn't know I'd be back in this bed I want to go home", multiple attempts to orient pt to being unable to provide for herself at home are unsuccessful

## 2019-01-19 NOTE — ED Provider Notes (Signed)
[facility] Emergency Department Provider Note ____________________________________________   First MD Initiated Contact with Patient 01/19/19 1907     (approximate)  I have reviewed the triage vital signs and the nursing notes.   HISTORY  Chief Complaint Fever and Weakness    HPI Sierra Lloyd is a 85 y.o. female with PMH as noted below who presents with generalized weakness, acute onset this afternoon, noticed when she got up from a nap and felt that she was too weak to stand up.  EMS noted a low-grade fever but the patient denies having had a fever at home.  She states that she did not eat all afternoon or into the evening and thinks she is weak because of this.  She denies any vomiting or diarrhea, shortness of breath or cough, or any urinary symptoms.  Past Medical History:  Diagnosis Date  . Allergy   . Anxiety   . Arthritis   . Diabetes mellitus without complication (HCC)   . Hypertension     Patient Active Problem List   Diagnosis Date Noted  . Adhesive capsulitis of right shoulder 12/27/2016  . Burn 12/27/2016  . Paresthesia 07/15/2015  . Allergic rhinitis 05/03/2015  . Arm pain 05/03/2015  . Arthritis 05/03/2015  . Diabetes (HCC) 05/03/2015  . Anxiety, generalized 05/03/2015  . BP (high blood pressure) 05/03/2015  . LBP (low back pain) 05/03/2015  . Panic attack 05/03/2015    Past Surgical History:  Procedure Laterality Date  . CATARACT EXTRACTION W/PHACO Left 04/12/2016   Procedure: CATARACT EXTRACTION PHACO AND INTRAOCULAR LENS PLACEMENT (IOC);  Surgeon: Sallee Lange, MD;  Location: [facility] ORS;  Service: Ophthalmology;  Laterality: Left;  [place]  02:06 AP% 26.8 CDE 55.05 fluid pack lot # [id number]    Prior to Admission medications   Medication Sig Start Date End Date Taking? Authorizing Provider  ALPRAZolam Prudy Feeler) 0.5 MG tablet TAKE 1/2 TO 1 TABLET BY MOUTH TWICE DAILY AS NEEDED ANXIETY 01/10/19  Yes Margaretann Loveless,  PA-C  aspirin 81 MG tablet Take 81 mg by mouth daily.   Yes [provider]  augmented betamethasone dipropionate (DIPROLENE-AF) 0.05 % cream Apply topically 2 (two) times daily. 01/10/19  Yes Margaretann Loveless, PA-C  bisoprolol-hydrochlorothiazide (ZIAC) 10-6.25 MG tablet Take 1 tablet by mouth daily. 01/10/19  Yes Margaretann Loveless, PA-C  Boswellia-Glucosamine-Vit D (GLUCOSAMINE COMPLEX PO) Take by mouth daily as needed. 1500   Yes [provider]  cholecalciferol (VITAMIN D) 1000 UNITS tablet Take 1,000 Units by mouth.    Yes [provider]  glucose blood test strip Use to test blood sugar once a day.  DX: E11.9. 11/13/15  Yes Lorie Phenix, MD  metFORMIN (GLUCOPHAGE) 500 MG tablet Take 1 tablet (500 mg total) by mouth 2 (two) times daily. 01/10/19  Yes Burnette, Alessandra Bevels, PA-C  OMEGA-3 FATTY ACIDS PO Take by mouth.    Yes [provider]  triamcinolone cream (KENALOG) 0.1 % Apply topically 2 (two) times daily. 01/10/19  Yes Margaretann Loveless, PA-C  vitamin B-12 (CYANOCOBALAMIN) 1000 MCG tablet Take 1 tablet (1,000 mcg total) by mouth daily. 07/15/15  Yes Lorie Phenix, MD  VITAMIN E-1000 PO Take by mouth.    Yes [provider]    Allergies Tetracycline  Family History  Problem Relation Age of Onset  . Asthma Mother   . Anxiety disorder Mother   . Lung disease Mother   . COPD Sister     Social History Social  History   Tobacco Use  . Smoking status: Never Smoker  . Smokeless tobacco: Never Used  Substance Use Topics  . Alcohol use: Yes    Comment: occasional  . Drug use: No    Review of Systems  Constitutional: No fever.  Positive for weakness. Eyes: No redness. ENT: No sore throat. Cardiovascular: Denies chest pain. Respiratory: Denies shortness of breath. Gastrointestinal: No vomiting or diarrhea.  Genitourinary: Negative for dysuria.  Musculoskeletal: Negative for back pain. Skin: Negative for  rash. Neurological: Negative for headache.   ____________________________________________   PHYSICAL EXAM:  VITAL SIGNS: ED Triage Vitals  Enc Vitals Group     BP 01/19/19 1917 (!) 177/79     Pulse Rate 01/19/19 1917 83     Resp 01/19/19 1917 18     Temp 01/19/19 1917 99.3 F (37.4 C)     Temp Source 01/19/19 1917 Oral     SpO2 01/19/19 1917 98 %     Weight 01/19/19 1918 122 lb 6.4 oz (55.5 kg)     Height 01/19/19 1918 4\' 8"  (1.422 m)     Head Circumference --      Peak Flow --      Pain Score 01/19/19 1918 5     Pain Loc --      Pain Edu? --      Excl. in GC? --     Constitutional: Alert and oriented. Well appearing and in no acute distress. Eyes: Conjunctivae are normal.  Head: Atraumatic. Nose: No congestion/rhinnorhea. Mouth/Throat: Mucous membranes are dry.   Neck: Normal range of motion.  Cardiovascular: Normal rate, regular rhythm. Grossly normal heart sounds.  Good peripheral circulation. Respiratory: Normal respiratory effort.  No retractions. Lungs CTAB. Gastrointestinal: Soft and nontender. No distention.  Musculoskeletal:  Extremities warm and well perfused.  Neurologic:  Normal speech and language. No gross focal neurologic deficits are appreciated.  Skin:  Skin is warm and dry. No rash noted. Psychiatric: Mood and affect are normal. Speech and behavior are normal.  ____________________________________________   LABS (all labs ordered are listed, but only abnormal results are displayed)  Labs Reviewed  URINALYSIS, COMPLETE (UACMP) WITH MICROSCOPIC - Abnormal; Notable for the following components:      Result Value   Color, Urine STRAW (*)    APPearance CLEAR (*)    Glucose, UA >=500 (*)    All other components within normal limits  CBC WITH DIFFERENTIAL/PLATELET - Abnormal; Notable for the following components:   WBC 18.3 (*)    HCT 35.4 (*)    Neutro Abs 16.0 (*)    Monocytes Absolute 1.1 (*)    Abs Immature Granulocytes 0.15 (*)    All other  components within normal limits  COMPREHENSIVE METABOLIC PANEL - Abnormal; Notable for the following components:   Glucose, Bld 286 (*)    Total Protein 6.1 (*)    All other components within normal limits  TROPONIN I  CBG MONITORING, ED   ____________________________________________  EKG  ED ECG REPORT I, Dionne Bucy, the attending physician, personally viewed and interpreted this ECG.  Date: 01/19/2019 EKG Time: 2028 Rate: 74 Rhythm: normal sinus rhythm QRS Axis: normal Intervals: normal ST/T Wave abnormalities: Nonspecific repolarization abnormality Narrative Interpretation: no evidence of acute ischemia  ____________________________________________  RADIOLOGY  CXR: No focal infiltrate or other acute abnormality  ____________________________________________   PROCEDURES  Procedure(s) performed: No  Procedures  Critical Care performed: No ____________________________________________   INITIAL IMPRESSION / ASSESSMENT AND PLAN / ED COURSE  Pertinent labs & imaging results that were available during my care of the patient were reviewed by me and considered in my medical decision making (see chart for details).  85 year old female with PMH as noted above presents with generalized weakness, acute onset this afternoon when she tried to get up and felt like her legs were too weak.  The patient states that she did not eat since earlier today and thinks this is the reason for her weakness.  She was noted to have a low-grade temperature by EMS although she denies any fevers at home.  Her review of systems is generally negative.  On exam the patient is overall relatively well-appearing for her age.  Her vital signs are normal except for hypertension and slightly elevated temperature.  The remainder of the exam is relatively unremarkable except for dry mucous membranes.  Initial lab work-up reveals elevated WBC count however based on the patient's symptoms there is no  specific source of infection.  Chest x-ray and UA are both negative.  I overall suspect most likely viral etiology.  Given the lack of respiratory symptoms or abnormal vital signs, I have a very low suspicion for influenza and there is no indication to test or treat for it based on this presentation.  The patient would like to go home.  I will give a small fluid bolus given that she does appear a bit dehydrated.  If she is feeling well after this we will send her home.  ----------------------------------------- 10:31 PM on 01/19/2019 -----------------------------------------  After fluids, the patient stated that she wanted to go home.  However, only attempted to ambulate with her she was unable to walk steadily on her own or really even stand up on her own.  However she kept saying that she did not want to stay in the hospital.  I had a frank discussion with her about my concern that it was not safe to send her home in her current condition, and that if we send her home she could injure herself.  The patient states that she was also feeling very anxious because she had not taken her evening Xanax.  After further discussion with the patient, she agreed that it would not be safe to go home.    At this time, the cause of the patient's weakness is unclear.  I still suspect that she may have a viral infection and is somewhat dehydrated.    Although she has no unilateral deficit, given the relatively acute development of weakness in the legs I ordered a CT to evaluate for CVA.  We will also continue gentle IV hydration.  At this time there is no indication for inpatient admission.  My plan will be to observe the patient in the ED and have physical therapy evaluation in the morning.  If she is feeling better and able to ambulate safely, then she may be discharged home then.  Otherwise she may need placement in a rehab facility.  ----------------------------------------- 11:14 PM on  01/19/2019 -----------------------------------------  I am signing the patient out to the oncoming physician Dr. York Cerise.  The plan will be for reassessment of ambulation in the morning, and PT and social work consult if indicated. ____________________________________________   FINAL CLINICAL IMPRESSION(S) / ED DIAGNOSES  Final diagnoses:  Weakness      NEW MEDICATIONS STARTED DURING THIS VISIT:  New Prescriptions   No medications on file     Note:  This document was prepared using Dragon voice recognition software and may  include unintentional dictation errors.    Dionne Bucy, MD 01/19/19 [phone]

## 2019-01-20 MED ORDER — BISOPROLOL-HYDROCHLOROTHIAZIDE 10-6.25 MG PO TABS
1.0000 | ORAL_TABLET | Freq: Every day | ORAL | Status: DC
  Filled 2019-01-20: qty 1

## 2019-01-20 MED ORDER — ASPIRIN EC 81 MG PO TBEC: 81.0000 mg | DELAYED_RELEASE_TABLET | Freq: Every day | ORAL | Status: DC

## 2019-01-20 MED ORDER — METFORMIN HCL 500 MG PO TABS: 500.0000 mg | ORAL_TABLET | Freq: Two times a day (BID) | ORAL | Status: DC

## 2019-01-20 MED ORDER — VITAMIN B-12 1000 MCG PO TABS
1000.0000 ug | ORAL_TABLET | Freq: Every day | ORAL | Status: DC
  Filled 2019-01-20: qty 1

## 2019-01-20 MED ORDER — GABAPENTIN 100 MG PO CAPS
100.0000 mg | ORAL_CAPSULE | Freq: Three times a day (TID) | ORAL | Status: DC
  Administered 2019-01-20: 100 mg via ORAL
  Filled 2019-01-20 (×2): qty 1

## 2019-01-20 MED ORDER — ALPRAZOLAM 0.5 MG PO TABS
0.2500 mg | ORAL_TABLET | Freq: Once | ORAL | Status: AC
  Administered 2019-01-20: 0.25 mg via ORAL
  Filled 2019-01-20: qty 1

## 2019-01-20 NOTE — ED Notes (Signed)
Awaiting gabapentin from pharmacy - pt is aware - pt is demanding to go home and states she does not need PT or SW consult - she reports that she is not going "to a home" and that she is going home to care for herself - it was reported by Danelle Earthly) that family did not feel that they could care for her at home Pt is constantly ringing and calling out for repositioning/water/pain meds/companionship - this nurse is  meet all immediate patient needs

## 2019-01-20 NOTE — ED Notes (Signed)
Repositioned pt on bed, placed pillow under her sacrum, pt reports feeling better, no other needs ar present

## 2019-01-20 NOTE — ED Provider Notes (Signed)
Pending evaluation by PT social work however the patient has stated very adamantly that she wants to be discharged without further work-up and to go home.  She does have decisional capacity, nurse has contacted family and they will come pick her up   Jene Every, MD 01/20/19 [phone]

## 2019-01-20 NOTE — Progress Notes (Signed)
LCSW reviewed patient information. As patient preference to return home and her ride will pick her up there is no need for SW. LCSW texted care manager who will review patient needs.   No further SW needs at this time.  Ladislao Cohenour LCSW

## 2019-01-20 NOTE — ED Notes (Signed)
Thereasa Distance returned call and states that in about 1 hour he will be to pick pt up

## 2019-01-20 NOTE — ED Notes (Signed)
Pt reports she takes Xanax at home for her leg pain. .25mg , Administer medication.

## 2019-01-20 NOTE — ED Notes (Signed)
Dr Cyril Loosen notified that pt is requesting to go home - he states to attempt to contact a ride for her to be discharged Ellery Plunk (patient contact) notified that pt is requesting to go home and that legally we are not able to keep her here - she supplied number for Thereasa Distance that would pick her up Thereasa Distance (family friend) [phone] - left HIPPA compliant message for Thereasa Distance to return call Ms Christin Fudge notified that Thereasa Distance was unable to be reached

## 2019-01-20 NOTE — ED Notes (Signed)
Call bell light answered, pt reports she has arthritis pain all over rates her pain 9/10.

## 2019-04-17 ENCOUNTER — Telehealth: Payer: Self-pay | Admitting: *Deleted

## 2019-04-17 DIAGNOSIS — H6690 Otitis media, unspecified, unspecified ear: Secondary | ICD-10-CM

## 2019-04-17 NOTE — Telephone Encounter (Signed)
Patient called office requesting to speak to [facility]. Patient is requesting Antony Contras call her back. Patient states it's [rivate. Please advise?

## 2019-04-18 ENCOUNTER — Ambulatory Visit: Payer: Self-pay | Admitting: Physician Assistant

## 2019-04-18 ENCOUNTER — Other Ambulatory Visit: Payer: Self-pay | Admitting: Physician Assistant

## 2019-04-18 DIAGNOSIS — L2084 Intrinsic (allergic) eczema: Secondary | ICD-10-CM

## 2019-04-18 MED ORDER — AMOXICILLIN 875 MG PO TABS: 875.0000 mg | ORAL_TABLET | Freq: Two times a day (BID) | ORAL | 0 refills | Status: DC

## 2019-04-18 NOTE — Telephone Encounter (Signed)
Patient has been having some ear pressure and fullness in her ears bilaterally. She also has had an episode of vertigo. She reports it is improving some but still having ear pain/pressure. Requesting an antibiotic. Amoxicillin sent in.

## 2019-04-18 NOTE — Telephone Encounter (Signed)
Please advise 

## 2019-04-18 NOTE — Telephone Encounter (Signed)
Pt called back today saying she would like Boneta Lucks to please call her.  [phone]  Thanks Barth Kirks

## 2019-06-20 ENCOUNTER — Telehealth: Payer: Self-pay | Admitting: Physician Assistant

## 2019-06-20 NOTE — Telephone Encounter (Signed)
Pt is still having vertigo. Not as bad.  Still having trouble walking. Asking what other options are there for her?  Please advise.  Thanks, [employer]

## 2019-06-20 NOTE — Telephone Encounter (Signed)
Patient only wants to talk to [place].

## 2019-06-20 NOTE — Telephone Encounter (Signed)
Would prefer referral to ent for further evaluation at this time. We have no female ENT locally, however.

## 2019-06-21 ENCOUNTER — Telehealth: Payer: Self-pay | Admitting: Physician Assistant

## 2019-06-21 NOTE — Telephone Encounter (Signed)
Patient called and stated she did not need for me to call her now

## 2019-06-21 NOTE — Telephone Encounter (Signed)
Pt called saying she does not need Tawanna Sat to call her.  She will call you back later.    She did get a couple of things to do over the phone for her vertigo and she is going to try those things.  Con Memos

## 2019-07-21 ENCOUNTER — Other Ambulatory Visit: Payer: Self-pay | Admitting: Physician Assistant

## 2019-07-21 DIAGNOSIS — F411 Generalized anxiety disorder: Secondary | ICD-10-CM

## 2019-07-25 ENCOUNTER — Other Ambulatory Visit: Payer: Self-pay | Admitting: Physician Assistant

## 2019-07-25 DIAGNOSIS — L2084 Intrinsic (allergic) eczema: Secondary | ICD-10-CM

## 2019-12-04 ENCOUNTER — Other Ambulatory Visit: Payer: Self-pay | Admitting: Physician Assistant

## 2019-12-04 DIAGNOSIS — L2084 Intrinsic (allergic) eczema: Secondary | ICD-10-CM

## 2019-12-04 NOTE — Telephone Encounter (Signed)
Requested medication (s) are due for refill today: Yes  Requested medication (s) are on the active medication list: Yes  Last refill:  04/18/19  Future visit scheduled: No  Notes to clinic:  See request.    Requested Prescriptions  Pending Prescriptions Disp Refills   augmented betamethasone dipropionate (DIPROLENE-AF) 0.05 % cream [Pharmacy Med Name: BETAMETHASONE DIPROPIONATE AUG 0.05] 50 g 0    Sig: APPLY TO AFFECTED AREA(s) TWICE DAILY      Off-Protocol Failed - 12/04/2019 11:21 AM      Failed - Medication not assigned to a protocol, review manually.      Passed - Valid encounter within last 12 months    Recent Outpatient Visits           10 months ago Type 2 diabetes mellitus without complication, without long-term current use of insulin [facility])   [facility] [place], [place], [place]   1 year ago GAD (generalized anxiety disorder)   [facility] [place], [place], PA-C   1 year ago Type 2 diabetes mellitus without complication, without long-term current use of insulin [facility])   [facility] [place], [place], [place]   1 year ago Type 2 diabetes mellitus without complication, without long-term current use of insulin [facility])   [facility] [place], Alessandra Bevels, [place]   1 year ago Intrinsic eczema   [facility] Joycelyn Man [place], [place]

## 2019-12-20 ENCOUNTER — Other Ambulatory Visit: Payer: Self-pay | Admitting: Physician Assistant

## 2019-12-20 DIAGNOSIS — I10 Essential (primary) hypertension: Secondary | ICD-10-CM

## 2019-12-20 DIAGNOSIS — F411 Generalized anxiety disorder: Secondary | ICD-10-CM

## 2019-12-20 NOTE — Telephone Encounter (Signed)
Requested medication (s) are due for refill today: yes  Requested medication (s) are on the active medication list: yes  Last refill:  11/20/2019  Future visit scheduled: no  Notes to clinic:  This refill cannot be delegated   Requested Prescriptions  Pending Prescriptions Disp Refills   ALPRAZolam (XANAX) 0.5 MG tablet [Pharmacy Med Name: ALPRAZOLAM 0.5 MG TAB] 60 tablet     Sig: TAKE 1/2 TO 1 TABLET BY MOUTH TWICE DAILY AS NEEDED ANXIETY      Not Delegated - Psychiatry:  Anxiolytics/Hypnotics Failed - 12/20/2019  1:35 PM      Failed - This refill cannot be delegated      Failed - Urine Drug Screen completed in last 360 days.      Failed - Valid encounter within last 6 months    Recent Outpatient Visits           11 months ago Type 2 diabetes mellitus without complication, without long-term current use of insulin [facility])   [facility] [place], [place], [place]   1 year ago GAD (generalized anxiety disorder)   [facility] [place], [place], PA-C   1 year ago Type 2 diabetes mellitus without complication, without long-term current use of insulin [facility])   [facility] [place], [place], [place]   1 year ago Type 2 diabetes mellitus without complication, without long-term current use of insulin [facility])   [facility] [place], Alessandra [place], [place]   1 year ago Intrinsic eczema   [facility] Joycelyn Man [place], [place]

## 2020-01-17 ENCOUNTER — Ambulatory Visit (INDEPENDENT_AMBULATORY_CARE_PROVIDER_SITE_OTHER): Payer: Medicare Other | Admitting: Physician Assistant

## 2020-01-17 ENCOUNTER — Other Ambulatory Visit: Payer: Self-pay | Admitting: Physician Assistant

## 2020-01-17 ENCOUNTER — Telehealth: Payer: Self-pay

## 2020-01-17 ENCOUNTER — Encounter: Payer: Self-pay | Admitting: Physician Assistant

## 2020-01-17 DIAGNOSIS — L2084 Intrinsic (allergic) eczema: Secondary | ICD-10-CM | POA: Diagnosis not present

## 2020-01-17 DIAGNOSIS — E119 Type 2 diabetes mellitus without complications: Secondary | ICD-10-CM | POA: Diagnosis not present

## 2020-01-17 DIAGNOSIS — I10 Essential (primary) hypertension: Secondary | ICD-10-CM | POA: Diagnosis not present

## 2020-01-17 MED ORDER — BISOPROLOL-HYDROCHLOROTHIAZIDE 10-6.25 MG PO TABS: 1.0000 | ORAL_TABLET | Freq: Every day | ORAL | 3 refills | Status: AC

## 2020-01-17 MED ORDER — BETAMETHASONE DIPROPIONATE AUG 0.05 % EX CREA: TOPICAL_CREAM | CUTANEOUS | 5 refills | Status: AC

## 2020-01-17 MED ORDER — METFORMIN HCL 500 MG PO TABS: 500.0000 mg | ORAL_TABLET | Freq: Two times a day (BID) | ORAL | 3 refills | Status: AC

## 2020-01-17 MED ORDER — TRIAMCINOLONE ACETONIDE 0.1 % EX CREA: TOPICAL_CREAM | CUTANEOUS | 0 refills | Status: DC

## 2020-01-17 NOTE — Telephone Encounter (Signed)
Pt has telephone visit scheduled for 5:20 today.  (01/17/2020)   Thanks,   -Vernona Rieger

## 2020-01-17 NOTE — Telephone Encounter (Signed)
Requested medication (s) are due for refill today: yes  Requested medication (s) are on the active medication list: yes  Last refill:  12/20/19  Future visit scheduled: no  Notes to clinic: no valid encounter within last 6 months    Requested Prescriptions  Pending Prescriptions Disp Refills   bisoprolol-hydrochlorothiazide (ZIAC) 10-6.25 MG tablet [Pharmacy Med Name: BISOPROLOL-HCTZ 10-6.25 MG TAB] 90 tablet 3    Sig: TAKE 1 TABLET BY MOUTH ONCE DAILY      Cardiovascular: Beta Blocker + Diuretic Combos Failed - 01/17/2020  1:07 PM      Failed - K in normal range and within 180 days    Potassium  Date Value Ref Range Status  01/19/2019 3.5 3.5 - 5.1 mmol/L Final          Failed - Na in normal range and within 180 days    Sodium  Date Value Ref Range Status  01/19/2019 137 135 - 145 mmol/L Final          Failed - Cr in normal range and within 180 days    Creatinine, Ser  Date Value Ref Range Status  01/19/2019 0.87 0.44 - 1.00 mg/dL Final          Failed - Ca in normal range and within 180 days    Calcium  Date Value Ref Range Status  01/19/2019 9.1 8.9 - 10.3 mg/dL Final          Failed - Valid encounter within last 6 months    Recent Outpatient Visits           1 year ago Type 2 diabetes mellitus without complication, without long-term current use of insulin [facility])   [facility] [place], [place], PA-C   1 year ago GAD (generalized anxiety disorder)   [facility] [place], [place], PA-C   1 year ago Type 2 diabetes mellitus without complication, without long-term current use of insulin [facility])   [facility] [place], [place], [place]   1 year ago Type 2 diabetes mellitus without complication, without long-term current use of insulin [facility])   [facility] [place], [place], [place]   1 year ago Intrinsic eczema   [facility] Joy[place]elyn Man [place], [place] -  Patient is not pregnant      Passed - Last BP in normal range    BP Readings from Last 1 Encounters:  01/20/19 132/70          Passed - Last Heart Rate in normal range    Pulse Readings from Last 1 Encounters:  01/20/19 78

## 2020-01-17 NOTE — Telephone Encounter (Signed)
Requested medication (s) are due for refill today: yes  Requested medication (s) are on the active medication list: yes  Last refill:  12/20/19  Future visit scheduled: no  Notes to clinic:  no valid encounter within last 6 months   Requested Prescriptions  Pending Prescriptions Disp Refills   metFORMIN (GLUCOPHAGE) 500 MG tablet [Pharmacy Med Name: METFORMIN HCL 500 MG TAB] 180 tablet 3    Sig: TAKE 1 TABLET BY MOUTH TWICE DAILY      Endocrinology:  Diabetes - Biguanides Failed - 01/17/2020  1:06 PM      Failed - Cr in normal range and within 360 days    Creatinine, Ser  Date Value Ref Range Status  01/19/2019 0.87 0.44 - 1.00 mg/dL Final          Failed - HBA1C is between 0 and 7.9 and within 180 days    Hemoglobin A1C  Date Value Ref Range Status  01/10/2019 8.4 (A) 4.0 - 5.6 % Final   Hgb A1c MFr Bld  Date Value Ref Range Status  12/25/2014 6.5 (A) 4.0 - 6.0 % Final          Failed - eGFR in normal range and within 360 days    GFR calc Af Amer  Date Value Ref Range Status  01/19/2019 >60 >60 mL/min Final   GFR calc non Af Amer  Date Value Ref Range Status  01/19/2019 >60 >60 mL/min Final          Failed - Valid encounter within last 6 months    Recent Outpatient Visits           1 year ago Type 2 diabetes mellitus without complication, without long-term current use of insulin [facility])   [place], [place], PA-C   1 year ago GAD (generalized anxiety disorder)   [place], [place], PA-C   1 year ago Type 2 diabetes mellitus without complication, without long-term current use of insulin [facility] [place])   [place], [place], [place]   1 year ago Type 2 diabetes mellitus without complication, without long-term current use of insulin [facility])   [facility] [place], [place]ce [place], [place]   1 year ago [place], [place], [place]

## 2020-01-17 NOTE — Telephone Encounter (Signed)
Copied from CRM [id number]. Topic: General - Other >> Jan 17, 2020  1:25 PM Dalphine Handing A wrote: Patient requesting a callback from nurse in regards to her medication requests.

## 2020-01-17 NOTE — Progress Notes (Signed)
Patient: Sierra Lloyd Female    DOB: November 05, 1934   86 y.o.   MRN: [id number] Visit Date: 01/17/2020  Today's Provider: Mar Daring, PA-C   No chief complaint on file.  Subjective:    Virtual Visit via Telephone Note  I connected with Lia Foyer on 01/17/20 at  5:20 PM EST by telephone and verified that I am speaking with the correct person using two identifiers.  Location: Patient: Home Provider: [facility]   I discussed the limitations, risks, security and privacy concerns of performing an evaluation and management service by telephone and the availability of in person appointments. I also discussed with the patient that there may be a patient responsible charge related to this service. The patient expressed understanding and agreed to proceed.    HPI    Diabetes Mellitus Type II, Follow-up:   Lab Results  Component Value Date   HGBA1C 8.4 (A) 01/10/2019   HGBA1C 6.9 (A) 10/11/2018   HGBA1C 6.8 (A) 06/21/2018   Last seen for diabetes 12 months ago.  Management since then includes no changes. She reports excellent compliance with treatment. She is not having side effects.  Current symptoms include none and have been stable. Home blood sugar records: are running normal.   Episodes of hypoglycemia? no   Most Recent Eye Exam: Pt is due for an eye exam. Weight trend: stable Current diet: in general, a "healthy" diet   Current exercise: none  ------------------------------------------------------------------------   Hypertension, follow-up:  BP Readings from Last 3 Encounters:  01/20/19 132/70  01/10/19 (!) 177/82  11/08/18 (!) 195/72    She was last seen for hypertension 12 months ago.  BP at that visit was 132/70. Management since that visit includes no changes She reports excellent compliance with treatment. She is not having side effects.  She is not exercising. She is adherent to low salt diet.   Outside blood pressures are running  normal. She is experiencing none.  Patient denies chest pain, fatigue, lower extremity edema and palpitations.   Cardiovascular risk factors include advanced age (older than 64 for men, 4 for women), diabetes mellitus, dyslipidemia and hypertension.  Use of agents associated with hypertension: none.   ------------------------------------------------------------------------  Wt Readings from Last 3 Encounters:  01/19/19 122 lb 6.4 oz (55.5 kg)  01/10/19 122 lb 6.4 oz (55.5 kg)  11/08/18 119 lb (54 kg)    ------------------------------------------------------------------------    Allergies  Allergen Reactions  . Tetracycline     Roof of mouth broke out.     Current Outpatient Medications:  .  ALPRAZolam (XANAX) 0.5 MG tablet, TAKE 1/2 TO 1 TABLET BY MOUTH TWICE DAILY AS NEEDED ANXIETY, Disp: 60 tablet, Rfl: 5 .  aspirin 81 MG tablet, Take 81 mg by mouth daily., Disp: , Rfl:  .  bisoprolol-hydrochlorothiazide (ZIAC) 10-6.25 MG tablet, Take 1 tablet by mouth daily., Disp: 90 tablet, Rfl: 3 .  Boswellia-Glucosamine-Vit D (GLUCOSAMINE COMPLEX PO), Take by mouth daily as needed. 1500, Disp: , Rfl:  .  cholecalciferol (VITAMIN D) 1000 UNITS tablet, Take 1,000 Units by mouth. , Disp: , Rfl:  .  glucose blood test strip, Use to test blood sugar once a day.  DX: E11.9., Disp: 100 each, Rfl: 3 .  metFORMIN (GLUCOPHAGE) 500 MG tablet, Take 1 tablet (500 mg total) by mouth 2 (two) times daily., Disp: 180 tablet, Rfl: 3 .  OMEGA-3 FATTY ACIDS PO, Take by mouth. , Disp: , Rfl:  .  triamcinolone cream (KENALOG) 0.1 %, APPLY TO AFFECTED AREA(s) TWICE DAILY, Disp: 454 g, Rfl: 0 .  vitamin B-12 (CYANOCOBALAMIN) 1000 MCG tablet, Take 1 tablet (1,000 mcg total) by mouth daily., Disp: 1 tablet, Rfl: 0 .  VITAMIN E-1000 PO, Take by mouth. , Disp: , Rfl:  .  amoxicillin (AMOXIL) 875 MG tablet, Take 1 tablet (875 mg total) by mouth 2 (two) times daily., Disp: 20 tablet, Rfl: 0 .  augmented betamethasone  dipropionate (DIPROLENE-AF) 0.05 % cream, APPLY TO AFFECTED AREA(s) TWICE DAILY, Disp: 50 g, Rfl: 0  Review of Systems  Constitutional: Negative.   Respiratory: Negative.   Cardiovascular: Negative.   Neurological: Negative for dizziness, light-headedness and headaches.  Psychiatric/Behavioral: Negative for dysphoric mood. The patient is not nervous/anxious.     Social History   Tobacco Use  . Smoking status: Never Smoker  . Smokeless tobacco: Never Used  Substance Use Topics  . Alcohol use: Yes    Comment: occasional      Objective:   There were no vitals taken for this visit. There were no vitals filed for this visit.There is no height or weight on file to calculate BMI.   Physical Exam Vitals reviewed.  Constitutional:      General: She is not in acute distress. Pulmonary:     Effort: No respiratory distress.  Neurological:     Mental Status: She is alert.      No results found for any visits on 01/17/20.     Assessment & Plan    1. Essential hypertension Stable. Diagnosis pulled for medication refill. Continue current medical treatment plan. - bisoprolol-hydrochlorothiazide (ZIAC) 10-6.25 MG tablet; Take 1 tablet by mouth daily.  Dispense: 90 tablet; Refill: 3  2. Type 2 diabetes mellitus without complication, without long-term current use of insulin (HCC) Stable. Diagnosis pulled for medication refill. Continue current medical treatment plan. - metFORMIN (GLUCOPHAGE) 500 MG tablet; Take 1 tablet (500 mg total) by mouth 2 (two) times daily.  Dispense: 180 tablet; Refill: 3  3. Intrinsic eczema Stable. Diagnosis pulled for medication refill. Continue current medical treatment plan. Uses Triamcinolone daily and uses augmented betamethasone only on really bad spots.  - triamcinolone cream (KENALOG) 0.1 %; APPLY TO AFFECTED AREA(s) TWICE DAILY  Dispense: 454 g; Refill: 0 - augmented betamethasone dipropionate (DIPROLENE-AF) 0.05 % cream; APPLY TO AFFECTED AREA(s)  TWICE DAILY  Dispense: 50 g; Refill: 5    I discussed the assessment and treatment plan with the patient. The patient was provided an opportunity to ask questions and all were answered. The patient agreed with the plan and demonstrated an understanding of the instructions.   The patient was advised to call back or seek an in-person evaluation if the symptoms worsen or if the condition fails to improve as anticipated.  I provided 14 minutes of non-face-to-face time during this encounter.  Margaretann Loveless, PA-C  [facility]

## 2020-04-17 ENCOUNTER — Other Ambulatory Visit: Payer: Self-pay | Admitting: Physician Assistant

## 2020-04-17 DIAGNOSIS — L2084 Intrinsic (allergic) eczema: Secondary | ICD-10-CM

## 2020-04-17 MED ORDER — TRIAMCINOLONE ACETONIDE 0.1 % EX CREA: TOPICAL_CREAM | CUTANEOUS | 1 refills | Status: AC

## 2020-04-17 NOTE — Progress Notes (Signed)
Triamcinolone cream refilled.

## 2020-04-20 ENCOUNTER — Emergency Department: Payer: Medicare Other

## 2020-04-20 ENCOUNTER — Other Ambulatory Visit: Payer: Self-pay

## 2020-04-20 ENCOUNTER — Inpatient Hospital Stay
Admission: EM | Admit: 2020-04-20 | Discharge: 2020-05-09 | DRG: 083 | Disposition: A | Discharge status: Skilled Nursing Facility | Payer: Medicare Other | Attending: Internal Medicine | Admitting: Internal Medicine

## 2020-04-20 DIAGNOSIS — N907 Vulvar cyst: Secondary | ICD-10-CM | POA: Diagnosis present

## 2020-04-20 DIAGNOSIS — D62 Acute posthemorrhagic anemia: Secondary | ICD-10-CM | POA: Diagnosis not present

## 2020-04-20 DIAGNOSIS — Y92009 Unspecified place in unspecified non-institutional (private) residence as the place of occurrence of the external cause: Secondary | ICD-10-CM

## 2020-04-20 DIAGNOSIS — S065X9A Traumatic subdural hemorrhage with loss of consciousness of unspecified duration, initial encounter: Secondary | ICD-10-CM | POA: Diagnosis not present

## 2020-04-20 DIAGNOSIS — Z515 Encounter for palliative care: Secondary | ICD-10-CM

## 2020-04-20 DIAGNOSIS — N179 Acute kidney failure, unspecified: Secondary | ICD-10-CM | POA: Diagnosis present

## 2020-04-20 DIAGNOSIS — Z79899 Other long term (current) drug therapy: Secondary | ICD-10-CM

## 2020-04-20 DIAGNOSIS — S065XAA Traumatic subdural hemorrhage with loss of consciousness status unknown, initial encounter: Secondary | ICD-10-CM

## 2020-04-20 DIAGNOSIS — T148XXA Other injury of unspecified body region, initial encounter: Secondary | ICD-10-CM

## 2020-04-20 DIAGNOSIS — Z751 Person awaiting admission to adequate facility elsewhere: Secondary | ICD-10-CM

## 2020-04-20 DIAGNOSIS — F411 Generalized anxiety disorder: Secondary | ICD-10-CM | POA: Diagnosis present

## 2020-04-20 DIAGNOSIS — W19XXXA Unspecified fall, initial encounter: Secondary | ICD-10-CM | POA: Diagnosis present

## 2020-04-20 DIAGNOSIS — Z818 Family history of other mental and behavioral disorders: Secondary | ICD-10-CM

## 2020-04-20 DIAGNOSIS — Z20822 Contact with and (suspected) exposure to covid-19: Secondary | ICD-10-CM | POA: Diagnosis present

## 2020-04-20 DIAGNOSIS — R001 Bradycardia, unspecified: Secondary | ICD-10-CM | POA: Diagnosis not present

## 2020-04-20 DIAGNOSIS — I6200 Nontraumatic subdural hemorrhage, unspecified: Secondary | ICD-10-CM | POA: Diagnosis present

## 2020-04-20 DIAGNOSIS — Z9842 Cataract extraction status, left eye: Secondary | ICD-10-CM

## 2020-04-20 DIAGNOSIS — E119 Type 2 diabetes mellitus without complications: Secondary | ICD-10-CM

## 2020-04-20 DIAGNOSIS — I10 Essential (primary) hypertension: Secondary | ICD-10-CM | POA: Diagnosis present

## 2020-04-20 DIAGNOSIS — L0291 Cutaneous abscess, unspecified: Secondary | ICD-10-CM

## 2020-04-20 DIAGNOSIS — F4310 Post-traumatic stress disorder, unspecified: Secondary | ICD-10-CM | POA: Diagnosis present

## 2020-04-20 DIAGNOSIS — R238 Other skin changes: Secondary | ICD-10-CM | POA: Diagnosis not present

## 2020-04-20 DIAGNOSIS — W06XXXA Fall from bed, initial encounter: Secondary | ICD-10-CM | POA: Diagnosis present

## 2020-04-20 DIAGNOSIS — M79605 Pain in left leg: Secondary | ICD-10-CM

## 2020-04-20 DIAGNOSIS — I609 Nontraumatic subarachnoid hemorrhage, unspecified: Secondary | ICD-10-CM

## 2020-04-20 DIAGNOSIS — Z7982 Long term (current) use of aspirin: Secondary | ICD-10-CM

## 2020-04-20 DIAGNOSIS — S0990XA Unspecified injury of head, initial encounter: Secondary | ICD-10-CM

## 2020-04-20 DIAGNOSIS — Y92238 Other place in hospital as the place of occurrence of the external cause: Secondary | ICD-10-CM | POA: Diagnosis present

## 2020-04-20 DIAGNOSIS — M79601 Pain in right arm: Secondary | ICD-10-CM

## 2020-04-20 DIAGNOSIS — M79604 Pain in right leg: Secondary | ICD-10-CM

## 2020-04-20 DIAGNOSIS — Z825 Family history of asthma and other chronic lower respiratory diseases: Secondary | ICD-10-CM

## 2020-04-20 DIAGNOSIS — Z7189 Other specified counseling: Secondary | ICD-10-CM

## 2020-04-20 DIAGNOSIS — I119 Hypertensive heart disease without heart failure: Secondary | ICD-10-CM | POA: Diagnosis present

## 2020-04-20 DIAGNOSIS — I824Y1 Acute embolism and thrombosis of unspecified deep veins of right proximal lower extremity: Secondary | ICD-10-CM

## 2020-04-20 DIAGNOSIS — F05 Delirium due to known physiological condition: Secondary | ICD-10-CM | POA: Diagnosis not present

## 2020-04-20 DIAGNOSIS — I82411 Acute embolism and thrombosis of right femoral vein: Secondary | ICD-10-CM | POA: Diagnosis not present

## 2020-04-20 DIAGNOSIS — R296 Repeated falls: Secondary | ICD-10-CM | POA: Diagnosis present

## 2020-04-20 DIAGNOSIS — Z7984 Long term (current) use of oral hypoglycemic drugs: Secondary | ICD-10-CM

## 2020-04-20 DIAGNOSIS — S7001XA Contusion of right hip, initial encounter: Secondary | ICD-10-CM | POA: Diagnosis present

## 2020-04-20 DIAGNOSIS — Z961 Presence of intraocular lens: Secondary | ICD-10-CM | POA: Diagnosis present

## 2020-04-20 DIAGNOSIS — D72829 Elevated white blood cell count, unspecified: Secondary | ICD-10-CM | POA: Diagnosis present

## 2020-04-20 DIAGNOSIS — T402X5A Adverse effect of other opioids, initial encounter: Secondary | ICD-10-CM | POA: Diagnosis not present

## 2020-04-20 LAB — COMPREHENSIVE METABOLIC PANEL
ALT: 18 U/L (ref 0–44)
AST: 30 U/L (ref 15–41)
Albumin: 3.7 g/dL (ref 3.5–5.0)
Alkaline Phosphatase: 39 U/L (ref 38–126)
Anion gap: 12 (ref 5–15)
BUN: 29 mg/dL — ABNORMAL HIGH (ref 8–23)
CO2: 25 mmol/L (ref 22–32)
Calcium: 9.4 mg/dL (ref 8.9–10.3)
Chloride: 103 mmol/L (ref 98–111)
Creatinine, Ser: 0.97 mg/dL (ref 0.44–1.00)
GFR calc Af Amer: >60 mL/min (ref >60)
GFR calc non Af Amer: 54 mL/min — ABNORMAL LOW (ref >60)
Glucose, Bld: 228 mg/dL — ABNORMAL HIGH (ref 70–99)
Potassium: 4.7 mmol/L (ref 3.5–5.1)
Sodium: 140 mmol/L (ref 135–145)
Total Bilirubin: 1 mg/dL (ref 0.3–1.2)
Total Protein: 6.1 g/dL — ABNORMAL LOW (ref 6.5–8.1)

## 2020-04-20 LAB — CBC
HCT: 30.1 % — ABNORMAL LOW (ref 36.0–46.0)
Hemoglobin: 10.5 g/dL — ABNORMAL LOW (ref 12.0–15.0)
MCH: 30.2 pg (ref 26.0–34.0)
MCHC: 34.9 g/dL (ref 30.0–36.0)
MCV: 86.5 fL (ref 80.0–100.0)
Platelets: 248 10*3/uL (ref 150–400)
RBC: 3.48 MIL/uL — ABNORMAL LOW (ref 3.87–5.11)
RDW: 12.6 % (ref 11.5–15.5)
WBC: 17.4 10*3/uL — ABNORMAL HIGH (ref 4.0–10.5)
nRBC: 0 % (ref 0.0–0.2)

## 2020-04-20 LAB — CK: Total CK: 85 U/L (ref 38–234)

## 2020-04-20 LAB — TROPONIN I (HIGH SENSITIVITY): Troponin I (High Sensitivity): 9 ng/L (ref <18)

## 2020-04-20 MED ORDER — LORAZEPAM 1 MG PO TABS
1.0000 mg | ORAL_TABLET | Freq: Once | ORAL | Status: AC
  Administered 2020-04-20: 1 mg via ORAL
  Filled 2020-04-20: qty 1

## 2020-04-20 MED ORDER — HALOPERIDOL 2 MG PO TABS
2.0000 mg | ORAL_TABLET | Freq: Once | ORAL | Status: AC
  Administered 2020-04-20: 2 mg via ORAL
  Filled 2020-04-20: qty 1

## 2020-04-20 NOTE — ED Notes (Signed)
Pt has received PO lorazepam, will attempt to draw blood and collect urine once pt is more calm

## 2020-04-20 NOTE — ED Notes (Signed)
ED Provider at bedside. 

## 2020-04-20 NOTE — ED Triage Notes (Signed)
Pt comes to ED via [facility] under IVC that family took out on pt. Pt is reported to have experienced a fall at home at 10:30AM today and has laid in floor all day. Family arrived a home and found pt and called 911. While EMS was in route, family reports pt experienced a syncopal episode and family was able to return pt to bed. EMS arrived and pt refused to come to ED despite events and BP in 80s systolic. EMS left and family took IVC on pt. PT experienced a 2nd syncopal episode at home with family when walking in living room. EMS arrived and brought pt to ED under IVC.  Pt arrives to ED uncooperative and oriented to person at this time. Pt is yelling and unhappy to be here. Pt arrives covered in stool and is cleaned by ED staff and is yelling to put her shoes on. Pt has bruising noted on right side of forehead, right hip, right thumb, and right axillary area. Pt has old/green colored bruise noted on left knee and bruise noted on left upper arm. Pt lives home alone.

## 2020-04-20 NOTE — ED Notes (Signed)
Family is now at bedside.

## 2020-04-20 NOTE — ED Notes (Signed)
XRAY is at bedside.

## 2020-04-20 NOTE — ED Notes (Signed)
Pt to CT

## 2020-04-20 NOTE — ED Notes (Signed)
Pt is continually yelling to, "put my shoe on," and "help me". Currently unable to take BP, will continue to attempt

## 2020-04-20 NOTE — ED Provider Notes (Signed)
[facility] Emergency Department Provider Note   ____________________________________________    I have reviewed the triage vital signs and the nursing notes.   HISTORY  Chief Complaint Fall     HPI Sierra Lloyd is a 86 y.o. female with a history of diabetes, hypertension who presents after a fall.  Apparently patient was found down by niece after having fallen at some point today.  Unclear how long she has been down.  Found to have a hematoma to the scalp.  Reportedly also had a syncopal episode after EMS arrived.  Apparently there was some difficulty in convincing the patient (who has a history of severe anxiety) to come to the emergency department and niece actually IVC the patient  Past Medical History:  Diagnosis Date  . Allergy   . Anxiety   . Arthritis   . Diabetes mellitus without complication (HCC)   . Hypertension     Patient Active Problem List   Diagnosis Date Noted  . Adhesive capsulitis of right shoulder 12/27/2016  . Burn 12/27/2016  . Paresthesia 07/15/2015  . Allergic rhinitis 05/03/2015  . Arm pain 05/03/2015  . Arthritis 05/03/2015  . Diabetes (HCC) 05/03/2015  . Anxiety, generalized 05/03/2015  . BP (high blood pressure) 05/03/2015  . LBP (low back pain) 05/03/2015  . Panic attack 05/03/2015    Past Surgical History:  Procedure Laterality Date  . CATARACT EXTRACTION W/PHACO Left 04/12/2016   Procedure: CATARACT EXTRACTION PHACO AND INTRAOCULAR LENS PLACEMENT (IOC);  Surgeon: Sallee Lange, MD;  Location: [facility] ORS;  Service: Ophthalmology;  Laterality: Left;  [place]  02:06 AP% 26.8 CDE 55.05 fluid pack lot # [id number]    Prior to Admission medications   Medication Sig Start Date End Date Taking? Authorizing Provider  ALPRAZolam Prudy Feeler) 0.5 MG tablet TAKE 1/2 TO 1 TABLET BY MOUTH TWICE DAILY AS NEEDED ANXIETY 12/20/19   Margaretann Loveless, PA-C  aspirin 81 MG tablet Take 81 mg by mouth daily.    [provider]  augmented betamethasone dipropionate (DIPROLENE-AF) 0.05 % cream APPLY TO AFFECTED AREA(s) TWICE DAILY 01/17/20   Margaretann Loveless, PA-C  bisoprolol-hydrochlorothiazide [facility]) 10-6.25 MG tablet Take 1 tablet by mouth daily. 01/17/20   Margaretann Loveless, PA-C  Boswellia-Glucosamine-Vit D (GLUCOSAMINE COMPLEX PO) Take by mouth daily as needed. 1500    [provider]  cholecalciferol (VITAMIN D) 1000 UNITS tablet Take 1,000 Units by mouth.     [provider]  glucose blood test strip Use to test blood sugar once a day.  DX: E11.9. 11/13/15   Lorie Phenix, MD  metFORMIN (GLUCOPHAGE) 500 MG tablet Take 1 tablet (500 mg total) by mouth 2 (two) times daily. 01/17/20   Margaretann Loveless, PA-C  OMEGA-3 FATTY ACIDS PO Take by mouth.     [provider]  triamcinolone cream (KENALOG) 0.1 % APPLY TO AFFECTED AREA(s) TWICE DAILY 04/17/20   Margaretann Loveless, PA-C  vitamin B-12 (CYANOCOBALAMIN) 1000 MCG tablet Take 1 tablet (1,000 mcg total) by mouth daily. 07/15/15   Lorie Phenix, MD  VITAMIN E-1000 PO Take by mouth.     [provider]  augmented betamethasone dipropionate (DIPROLENE-AF) 0.05 % cream APPLY TO AFFECTED AREA(s) TWICE DAILY 04/18/19   Margaretann Loveless, PA-C     Allergies Tetracycline  Family History  Problem Relation Age of Onset  . Asthma Mother   . Anxiety disorder Mother   . Lung disease Mother   . COPD Sister  Social History Social History   Tobacco Use  . Smoking status: Never Smoker  . Smokeless tobacco: Never Used  Substance Use Topics  . Alcohol use: Yes    Comment: occasional  . Drug use: No    Review of Systems  Constitutional: No dizziness Eyes: No visual changes.  ENT: No neck pain Cardiovascular: Denies chest pain. Respiratory: Denies shortness of breath. Gastrointestinal: No abdominal pain.     Genitourinary: Denies groin injury Musculoskeletal: Bilateral hip pain Skin: Multiple  bruises to the extremities Neurological: Denies weakness   ____________________________________________   PHYSICAL EXAM:  VITAL SIGNS: ED Triage Vitals  Enc Vitals Group     BP 04/20/20 2200 (!) 162/119     Pulse Rate 04/20/20 2120 74     Resp 04/20/20 2120 20     Temp 04/20/20 2120 97.8 F (36.6 C)     Temp src --      SpO2 04/20/20 2120 100 %     Weight 04/20/20 2122 50.6 kg (111 lb 8.8 oz)     Height 04/20/20 2122 1.524 m (5')     Head Circumference --      Peak Flow --      Pain Score --      Pain Loc --      Pain Edu? --      Excl. in GC? --     Constitutional: Alert and oriented.  Highly anxious Eyes: Conjunctivae are normal.  Head: Large hematoma to the right scalp Nose: No congestion/rhinnorhea. Mouth/Throat: Mucous membranes are moist.   Neck:  Painless ROM, no vertebral tenderness palpation Cardiovascular: Normal rate, regular rhythm. Grossly normal heart sounds.  Good peripheral circulation.  No chest wall tenderness to palpation Respiratory: Normal respiratory effort.  No retractions. Gastrointestinal: Soft and nontender. No distention.    Musculoskeletal: Able to range all extremities without pain.  No pain with axial load on both hips.  Warm and well perfused Neurologic:  Normal speech and language. No gross focal neurologic deficits are appreciated.  Skin:  Skin is warm, dry and intact.  Multiple bruises noted to the extremities Psychiatric: Highly anxious but coherent and able to answer questions appropriately.  ____________________________________________   LABS (all labs ordered are listed, but only abnormal results are displayed)  Labs Reviewed  CBC - Abnormal; Notable for the following components:      Result Value   WBC 17.4 (*)    RBC 3.48 (*)    Hemoglobin 10.5 (*)    HCT 30.1 (*)    All other components within normal limits  COMPREHENSIVE METABOLIC PANEL - Abnormal; Notable for the following components:   Glucose, Bld 228 (*)    BUN  29 (*)    Total Protein 6.1 (*)    GFR calc non Af Amer 54 (*)    All other components within normal limits  CK  URINALYSIS, COMPLETE (UACMP) WITH MICROSCOPIC  TROPONIN I (HIGH SENSITIVITY)   ____________________________________________  EKG  ED ECG REPORT I, Jene Every, the attending physician, personally viewed and interpreted this ECG.  Date: 04/20/2020  Rhythm: normal sinus rhythm QRS Axis: normal Intervals: normal ST/T Wave abnormalities: normal Narrative Interpretation: no evidence of acute ischemia  ____________________________________________  RADIOLOGY  CT head demonstrates acute subdural hematoma ____________________________________________   PROCEDURES  Procedure(s) performed: No  Procedures     ____________________________________________   INITIAL IMPRESSION / ASSESSMENT AND PLAN / ED COURSE  Pertinent labs & imaging results that were available during my care of the  patient were reviewed by me and considered in my medical decision making (see chart for details).  Patient presents after being found down status post fall.  She has a large hematoma to the scalp, multiple bruises noted of varying ages.  She is highly anxious and was very resistant to coming to the emergency department, some reports of mental illness not well clarified.  However given that she lives alone and has evidence of a head injury do think she needs further work-up here, will complete the IVC.  CT scan demonstrates small acute/subacute subdural hematoma.  Discussed with niece who is the only family the patient has, we did discuss options for transfer to [facility] however niece reports the patient will get extraordinarily anxious if taken to a place where she can be visited frequently.  Niece does not want the patient transferred.  We discussed another option which is to repeat the CT scan in 6 hours, if no significant change can keep the patient here in our emergency department and have  case management and social work consult as niece primarily needs home health for the patient in order to keep her in her home as long as possible.  This is a reasonable plan and is in the best interest of the patient  I have reordered CT scan, consulted transitions of care and placed home health orders.    ____________________________________________   FINAL CLINICAL IMPRESSION(S) / ED DIAGNOSES  Final diagnoses:  Injury of head, initial encounter  Subdural hematoma ([place])        Note:  This document was prepared using Dragon voice recognition software and may include unintentional dictation errors.   Lavonia Drafts, MD 04/20/20 [phone]

## 2020-04-21 ENCOUNTER — Inpatient Hospital Stay: Payer: Medicare Other

## 2020-04-21 ENCOUNTER — Emergency Department: Payer: Medicare Other

## 2020-04-21 DIAGNOSIS — I119 Hypertensive heart disease without heart failure: Secondary | ICD-10-CM | POA: Diagnosis present

## 2020-04-21 DIAGNOSIS — Z7189 Other specified counseling: Secondary | ICD-10-CM | POA: Diagnosis not present

## 2020-04-21 DIAGNOSIS — I824Y1 Acute embolism and thrombosis of unspecified deep veins of right proximal lower extremity: Secondary | ICD-10-CM | POA: Diagnosis not present

## 2020-04-21 DIAGNOSIS — T402X5A Adverse effect of other opioids, initial encounter: Secondary | ICD-10-CM | POA: Diagnosis not present

## 2020-04-21 DIAGNOSIS — S7002XA Contusion of left hip, initial encounter: Secondary | ICD-10-CM | POA: Diagnosis not present

## 2020-04-21 DIAGNOSIS — F411 Generalized anxiety disorder: Secondary | ICD-10-CM | POA: Diagnosis present

## 2020-04-21 DIAGNOSIS — Y92009 Unspecified place in unspecified non-institutional (private) residence as the place of occurrence of the external cause: Secondary | ICD-10-CM | POA: Diagnosis not present

## 2020-04-21 DIAGNOSIS — F05 Delirium due to known physiological condition: Secondary | ICD-10-CM | POA: Diagnosis not present

## 2020-04-21 DIAGNOSIS — M79604 Pain in right leg: Secondary | ICD-10-CM | POA: Diagnosis not present

## 2020-04-21 DIAGNOSIS — R238 Other skin changes: Secondary | ICD-10-CM | POA: Diagnosis not present

## 2020-04-21 DIAGNOSIS — W19XXXA Unspecified fall, initial encounter: Secondary | ICD-10-CM | POA: Diagnosis present

## 2020-04-21 DIAGNOSIS — W19XXXD Unspecified fall, subsequent encounter: Secondary | ICD-10-CM | POA: Diagnosis not present

## 2020-04-21 DIAGNOSIS — W06XXXA Fall from bed, initial encounter: Secondary | ICD-10-CM | POA: Diagnosis present

## 2020-04-21 DIAGNOSIS — D72829 Elevated white blood cell count, unspecified: Secondary | ICD-10-CM | POA: Diagnosis present

## 2020-04-21 DIAGNOSIS — S065X9A Traumatic subdural hemorrhage with loss of consciousness of unspecified duration, initial encounter: Secondary | ICD-10-CM | POA: Diagnosis present

## 2020-04-21 DIAGNOSIS — Z818 Family history of other mental and behavioral disorders: Secondary | ICD-10-CM | POA: Diagnosis not present

## 2020-04-21 DIAGNOSIS — I6200 Nontraumatic subdural hemorrhage, unspecified: Secondary | ICD-10-CM

## 2020-04-21 DIAGNOSIS — Z515 Encounter for palliative care: Secondary | ICD-10-CM | POA: Diagnosis not present

## 2020-04-21 DIAGNOSIS — D62 Acute posthemorrhagic anemia: Secondary | ICD-10-CM | POA: Diagnosis not present

## 2020-04-21 DIAGNOSIS — Z7982 Long term (current) use of aspirin: Secondary | ICD-10-CM | POA: Diagnosis not present

## 2020-04-21 DIAGNOSIS — N907 Vulvar cyst: Secondary | ICD-10-CM | POA: Diagnosis present

## 2020-04-21 DIAGNOSIS — Y92238 Other place in hospital as the place of occurrence of the external cause: Secondary | ICD-10-CM | POA: Diagnosis present

## 2020-04-21 DIAGNOSIS — Z79899 Other long term (current) drug therapy: Secondary | ICD-10-CM | POA: Diagnosis not present

## 2020-04-21 DIAGNOSIS — R001 Bradycardia, unspecified: Secondary | ICD-10-CM | POA: Diagnosis not present

## 2020-04-21 DIAGNOSIS — T148XXA Other injury of unspecified body region, initial encounter: Secondary | ICD-10-CM | POA: Diagnosis present

## 2020-04-21 DIAGNOSIS — Z7984 Long term (current) use of oral hypoglycemic drugs: Secondary | ICD-10-CM | POA: Diagnosis not present

## 2020-04-21 DIAGNOSIS — Z825 Family history of asthma and other chronic lower respiratory diseases: Secondary | ICD-10-CM | POA: Diagnosis not present

## 2020-04-21 DIAGNOSIS — N179 Acute kidney failure, unspecified: Secondary | ICD-10-CM | POA: Diagnosis present

## 2020-04-21 DIAGNOSIS — I10 Essential (primary) hypertension: Secondary | ICD-10-CM | POA: Diagnosis present

## 2020-04-21 DIAGNOSIS — I82411 Acute embolism and thrombosis of right femoral vein: Secondary | ICD-10-CM | POA: Diagnosis not present

## 2020-04-21 DIAGNOSIS — E119 Type 2 diabetes mellitus without complications: Secondary | ICD-10-CM | POA: Diagnosis present

## 2020-04-21 DIAGNOSIS — R296 Repeated falls: Secondary | ICD-10-CM | POA: Diagnosis present

## 2020-04-21 DIAGNOSIS — F4310 Post-traumatic stress disorder, unspecified: Secondary | ICD-10-CM | POA: Diagnosis present

## 2020-04-21 DIAGNOSIS — Z9842 Cataract extraction status, left eye: Secondary | ICD-10-CM | POA: Diagnosis not present

## 2020-04-21 DIAGNOSIS — Z20822 Contact with and (suspected) exposure to covid-19: Secondary | ICD-10-CM | POA: Diagnosis present

## 2020-04-21 DIAGNOSIS — Z961 Presence of intraocular lens: Secondary | ICD-10-CM | POA: Diagnosis present

## 2020-04-21 LAB — HEMOGLOBIN A1C
Hgb A1c MFr Bld: 7 % — ABNORMAL HIGH (ref 4.8–5.6)
Mean Plasma Glucose: 154.2 mg/dL

## 2020-04-21 LAB — URINALYSIS, COMPLETE (UACMP) WITH MICROSCOPIC
Bacteria, UA: NONE SEEN
Bilirubin Urine: NEGATIVE
Glucose, UA: 50 mg/dL — AB
Hgb urine dipstick: NEGATIVE
Ketones, ur: 5 mg/dL — AB
Leukocytes,Ua: NEGATIVE
Nitrite: NEGATIVE
Protein, ur: 30 mg/dL — AB
Specific Gravity, Urine: 1.023 (ref 1.005–1.030)
pH: 5 (ref 5.0–8.0)

## 2020-04-21 LAB — GLUCOSE, CAPILLARY
Glucose-Capillary: 149 mg/dL — ABNORMAL HIGH (ref 70–99)
Glucose-Capillary: 128 mg/dL — ABNORMAL HIGH (ref 70–99)

## 2020-04-21 LAB — SARS CORONAVIRUS 2 BY RT PCR (HOSPITAL ORDER, PERFORMED IN ~~LOC~~ HOSPITAL LAB): SARS Coronavirus 2: NEGATIVE

## 2020-04-21 MED ORDER — NALOXONE HCL 2 MG/2ML IJ SOSY
1.0000 mg | PREFILLED_SYRINGE | Freq: Once | INTRAMUSCULAR | Status: AC
  Administered 2020-04-21: 1 mg via INTRAVENOUS

## 2020-04-21 MED ORDER — LORAZEPAM 2 MG/ML IJ SOLN
INTRAMUSCULAR | Status: AC
  Administered 2020-04-21: 2 mg via INTRAVENOUS
  Filled 2020-04-21: qty 1

## 2020-04-21 MED ORDER — SODIUM CHLORIDE 0.9% FLUSH
3.0000 mL | INTRAVENOUS | Status: DC | PRN
  Administered 2020-05-08: 22:00:00 3 mL via INTRAVENOUS

## 2020-04-21 MED ORDER — OMEGA-3-ACID ETHYL ESTERS 1 G PO CAPS
1.0000 g | ORAL_CAPSULE | Freq: Every day | ORAL | Status: DC
  Administered 2020-04-23 – 2020-04-28 (×4): 1 g via ORAL
  Filled 2020-04-21 (×8): qty 1

## 2020-04-21 MED ORDER — ALPRAZOLAM 0.5 MG PO TABS
0.5000 mg | ORAL_TABLET | Freq: Two times a day (BID) | ORAL | Status: DC | PRN
  Administered 2020-04-21 – 2020-04-24 (×6): 0.5 mg via ORAL
  Filled 2020-04-21 (×8): qty 1

## 2020-04-21 MED ORDER — ONDANSETRON HCL 4 MG/2ML IJ SOLN: 4.0000 mg | Freq: Four times a day (QID) | INTRAMUSCULAR | Status: DC | PRN

## 2020-04-21 MED ORDER — ONDANSETRON HCL 4 MG PO TABS: 4.0000 mg | ORAL_TABLET | Freq: Four times a day (QID) | ORAL | Status: DC | PRN

## 2020-04-21 MED ORDER — SODIUM CHLORIDE 0.9 % IV SOLN: 250.0000 mL | INTRAVENOUS | Status: DC | PRN

## 2020-04-21 MED ORDER — VITAMIN D3 25 MCG (1000 UNIT) PO TABS
1000.0000 [IU] | ORAL_TABLET | Freq: Every day | ORAL | Status: DC
  Administered 2020-04-23 – 2020-05-09 (×15): 1000 [IU] via ORAL
  Filled 2020-04-21 (×35): qty 1

## 2020-04-21 MED ORDER — VITAMIN B-12 1000 MCG PO TABS
1000.0000 ug | ORAL_TABLET | Freq: Every day | ORAL | Status: DC
  Administered 2020-04-23 – 2020-05-09 (×15): 1000 ug via ORAL
  Filled 2020-04-21 (×18): qty 1

## 2020-04-21 MED ORDER — OXYCODONE-ACETAMINOPHEN 5-325 MG PO TABS
1.0000 | ORAL_TABLET | Freq: Once | ORAL | Status: AC
  Administered 2020-04-21: 1 via ORAL
  Filled 2020-04-21: qty 1

## 2020-04-21 MED ORDER — INSULIN ASPART 100 UNIT/ML ~~LOC~~ SOLN
0.0000 [IU] | Freq: Three times a day (TID) | SUBCUTANEOUS | Status: DC
  Administered 2020-04-21: 2 [IU] via SUBCUTANEOUS
  Administered 2020-04-23: 17:00:00 3 [IU] via SUBCUTANEOUS
  Administered 2020-04-24: 13:00:00 2 [IU] via SUBCUTANEOUS
  Administered 2020-04-24: 09:00:00 3 [IU] via SUBCUTANEOUS
  Administered 2020-04-25: 5 [IU] via SUBCUTANEOUS
  Administered 2020-04-25 (×2): 3 [IU] via SUBCUTANEOUS
  Administered 2020-04-26: 2 [IU] via SUBCUTANEOUS
  Administered 2020-04-26: 08:00:00 4 [IU] via SUBCUTANEOUS
  Administered 2020-04-27: 10:00:00 3 [IU] via SUBCUTANEOUS
  Administered 2020-04-28: 2 [IU] via SUBCUTANEOUS
  Administered 2020-04-28: 14:00:00 11 [IU] via SUBCUTANEOUS
  Administered 2020-04-28: 08:00:00 2 [IU] via SUBCUTANEOUS
  Administered 2020-04-29: 14:00:00 5 [IU] via SUBCUTANEOUS
  Administered 2020-04-30: 2 [IU] via SUBCUTANEOUS
  Administered 2020-04-30 (×2): 5 [IU] via SUBCUTANEOUS
  Administered 2020-05-01 (×3): 3 [IU] via SUBCUTANEOUS
  Administered 2020-05-02: 16:00:00 5 [IU] via SUBCUTANEOUS
  Administered 2020-05-03 (×2): 3 [IU] via SUBCUTANEOUS
  Administered 2020-05-04: 17:00:00 2 [IU] via SUBCUTANEOUS
  Administered 2020-05-04: 3 [IU] via SUBCUTANEOUS
  Administered 2020-05-05 (×2): 5 [IU] via SUBCUTANEOUS
  Administered 2020-05-05: 09:00:00 2 [IU] via SUBCUTANEOUS
  Administered 2020-05-06: 18:00:00 3 [IU] via SUBCUTANEOUS
  Administered 2020-05-06: 2 [IU] via SUBCUTANEOUS
  Administered 2020-05-06: 12:00:00 3 [IU] via SUBCUTANEOUS
  Administered 2020-05-07: 13:00:00 5 [IU] via SUBCUTANEOUS
  Administered 2020-05-07 – 2020-05-09 (×4): 3 [IU] via SUBCUTANEOUS
  Filled 2020-04-21 (×37): qty 1

## 2020-04-21 MED ORDER — ACETAMINOPHEN 325 MG PO TABS
650.0000 mg | ORAL_TABLET | Freq: Four times a day (QID) | ORAL | Status: DC | PRN
  Administered 2020-04-22 – 2020-05-08 (×4): 650 mg via ORAL
  Filled 2020-04-21 (×4): qty 2

## 2020-04-21 MED ORDER — LORAZEPAM 2 MG/ML IJ SOLN: 2.0000 mg | Freq: Once | INTRAMUSCULAR | Status: AC

## 2020-04-21 MED ORDER — BISOPROLOL-HYDROCHLOROTHIAZIDE 10-6.25 MG PO TABS
1.0000 | ORAL_TABLET | Freq: Every day | ORAL | Status: DC
  Administered 2020-04-23 – 2020-05-09 (×15): 1 via ORAL
  Filled 2020-04-21 (×20): qty 1

## 2020-04-21 MED ORDER — HYDROCODONE-ACETAMINOPHEN 5-325 MG PO TABS: 1.0000 | ORAL_TABLET | Freq: Four times a day (QID) | ORAL | Status: DC | PRN

## 2020-04-21 MED ORDER — SODIUM CHLORIDE 0.9% FLUSH
3.0000 mL | Freq: Two times a day (BID) | INTRAVENOUS | Status: DC
  Administered 2020-04-22 – 2020-05-09 (×30): 3 mL via INTRAVENOUS

## 2020-04-21 NOTE — ED Notes (Signed)
PT niece updated that pt meds have been locked away and pt still in ED

## 2020-04-21 NOTE — ED Notes (Signed)
Pt niece, Camelia Eng, has went home for night. She has taken pt belongings that pt came to ED with. Has left glasses for pt at bedside

## 2020-04-21 NOTE — ED Notes (Signed)
Pt given breakfast tray. Sitter at bedside  °

## 2020-04-21 NOTE — ED Notes (Signed)
Assisted pt to slide up in bed and reposition [facility] for comfort. Bed alarm remains under pt at this time. Lights continued to be dimmed and call bell in reach by left arm

## 2020-04-21 NOTE — ED Notes (Signed)
At 1020 pt had episode of bradycardia with rate in 30's - unable to arouse pt with verbal stimuli or sternal rub - Charge RN and Dr Roxan Hockey notified - pt placed on cardiac pads - Pt placed on non-rebreather with O2 sat improved to 100% - per Dr Roxan Hockey pt given Narcan 1mg  and ordered repeat CT of head - pt with slurred speech but did arouse with narcan 1mg  and HR improved to 50's - CBG obtained and pt transferred to CT - Pt will be moving back to room #1 and EKG to be obtained in that room - will message admitting provider

## 2020-04-21 NOTE — ED Notes (Addendum)
This RN, Product/process development scientist, and [place] NT at bedside to attempt urine collection; unsuccessful.

## 2020-04-21 NOTE — ED Notes (Signed)
Pt taken to CT.

## 2020-04-21 NOTE — ED Provider Notes (Signed)
_________________________ 12:47 AM on 04/21/2020 -----------------------------------------  Accepted care of this patient from Dr. Cyril Loosen.  86 year old female status post mechanical fall with a small acute on chronic subdural hemorrhage.  Patient is here with her niece.  Patient has history of severe anxiety.  Patient and niece were opposed to transfer to [facility] for trauma evaluation due to patient severe anxiety.  There wishes as the patient goes home.  They do not wish any interventions.  Plan is to repeat head CT in 6 hours to ensure no increase of bleed and continue to monitor patient.  Patient is extremely agitated, crying, very anxious and asking me "please do not kill me.  I just want to go home."  We have been unable to get a real blood pressure on her as every time the blood pressure cuff inflated she becomes very anxious and starts moving around and trying to remove it.  Patient is on chronic benzos at home.  Will give 2 mg of IV Ativan and continue to monitor.  _________________________ 6:31 AM on 04/21/2020 -----------------------------------------  Repeat head CT was unchanged.  Patient had a fall in her room that was unwitnessed.  She did have a bed alarm but the alarm never went off.  She was sent for a 3rd CT which is again unchanged.  She also underwent CT of the cervical spine which was negative for acute injury.  X-rays of both hips since patient was complaining of hip pain negative.  Patient is now requesting not to be sent home and wishes to be admitted.  I consulted NSG and spoke with Dr. Venetia Maxon who recommended repeat CT in 24 hours but no interventions or transfer needed. I tried to reach her niece over the phone unsuccessfully but I did leave a message. Discussed with the Hospitalist for admission   Don Perking [place], MD 04/21/20 [phone]

## 2020-04-21 NOTE — ED Notes (Signed)
Pt again in room, yelling due to being alone. Wants someone in room at all times. No attempt to get out of bed noted.

## 2020-04-21 NOTE — ED Notes (Signed)
This Clinical research associate at pt bedside for 1:1 Recruitment consultant, pt brief checked by this Clinical research associate and pt remains dry.

## 2020-04-21 NOTE — ED Notes (Signed)
Pt Clinical research associate given report by previous 1:1 sitter (Tracey L), this tech will remain at pt bedside completing q 15 minute checks.

## 2020-04-21 NOTE — TOC Initial Note (Signed)
Transition of Care [facility]) - Initial/Assessment Note    Patient Details  Name: Sierra Lloyd MRN: [id number] Date of Birth: September 02, 1934  [facility]) CM/SW Contact:    [place] Cellar, RN Phone Number: 04/21/2020, 12:14 PM  Clinical Narrative:                  LVMM for niece to gather information for potential placement.        Patient Goals and CMS Choice        Expected Discharge Plan and Services                                                Prior Living Arrangements/Services                       Activities of Daily Living      Permission Sought/Granted                  Emotional Assessment              Admission diagnosis:  Fall [W19.XXXA] Patient Active Problem List   Diagnosis Date Noted  . Subdural hemorrhage (HCC) 04/21/2020  . Fall at home, initial encounter 04/21/2020  . Fall 04/21/2020  . Adhesive capsulitis of right shoulder 12/27/2016  . Burn 12/27/2016  . Paresthesia 07/15/2015  . Allergic rhinitis 05/03/2015  . Arm pain 05/03/2015  . Arthritis 05/03/2015  . Diabetes (HCC) 05/03/2015  . Anxiety, generalized 05/03/2015  . BP (high blood pressure) 05/03/2015  . LBP (low back pain) 05/03/2015  . Panic attack 05/03/2015   PCP:  Margaretann Loveless, PA-C Pharmacy:   [facility], [place] - [address] [place] [address] Phone: [phone] Fax: [phone]     Social Determinants of Health (SDOH) Interventions    Readmission Risk Interventions No flowsheet data found.

## 2020-04-21 NOTE — ED Notes (Signed)
Attempted to take BP for 3rd time with inconsistent result. Pt immediately tenses up entire body when BP cuff inflates. MD notified at this time for further guidance.

## 2020-04-21 NOTE — ED Notes (Signed)
BP taken, MD notified. PT was sleeping and when cuff began to inflate pt began to yell and tense

## 2020-04-21 NOTE — H&P (Signed)
History and Physical    Sierra Residesatricia A Laningham UEA:[id number] DOB: 08/01/1934 DOA: 04/20/2020  PCP: [name], Jennifer M, PA-C   Patient coming from: Home  I have personally briefly reviewed patient[facility]  Chief Complaint:  S/P Fall  HPI: Sierra Lloyd is a 86 y.o. female with medical history significant for diabetes mellitus, hypertension and anxiety who presented to the ER for evaluation after a fall. Patient refused to come to the ER and her niece had to take out IVC papers to be able to get her evaluated. Patient noted to have a hematoma on her forehead and significant bruising over the right thigh. She moans out in pain with any form of movement especially when she turns in bed. Patient had a CT scan of the head which shows  tiny amount of mixed density subdural hemorrhage overlying the right frontal lobe measuring 4 mm in maximum diameter. No midline shift. Findings consistent with age related atrophy and chronic small vessel ischemia. Soft tissue hematoma overlying the right frontal skull. [facility] was [facility] with patient's niece who declined because of her onset severe anxiety and opted to have her [facility] at [facility] and to get a repeat CT scan of the head to evaluate for worsening bleed. Patient had another fall in her room that was unwitnessed, she had a bed alarm that never went off. She had a repeat CT scan which was actually the third one and which was unchanged. Neurosurgeon was consulted in the ER who recommended to repeat CT scan in 24 hours but that no intervention or transfer was needed at this time. Patient is agitated and very anxious and keeps requesting to be discharged. Unable to do a review of systems on this patient due to her mental status Had an extensive discussion with her niece over the phone Ms Andreas Ohmerri Ross who is in agreement for patient to be hospitalized and is also in agreement for short-term  subacute rehab with goals of getting the patient home.  ED Course: 86 year old Caucasian female who was brought into the emergency room by her niece who had to take her IVC papers on her to be able to get her to the ER for evaluation. Patient is status post fall and has a tiny subdural hemorrhage. Neurosurgery was consulted and a repeat CT scan of the head was recommended in 24 hours. Patient will be admitted to the hospital for further evaluation  Review of Systems: As per HPI otherwise 10 point review of systems negative.    Past Medical History:  Diagnosis Date  . Allergy   . Anxiety   . Arthritis   . Diabetes mellitus without complication (HCC)   . Hypertension     Past Surgical History:  Procedure Laterality Date  . CATARACT EXTRACTION W/PHACO Left 04/12/2016   Procedure: CATARACT EXTRACTION PHACO AND INTRAOCULAR LENS PLACEMENT (IOC);  Surgeon: Sallee LangeSteven Dingeldein, MD;  Location: [facility] ORS;  Service: Ophthalmology;  Laterality: Left;  US  02:06 AP% 26.8 CDE 55.05 fluid pack lot # [id number] H     reports that she has never smoked. She has never used smokeless tobacco. She reports current alcohol use. She reports that she does not use drugs.  Allergies  Allergen Reactions  . Tetracycline     Roof of mouth broke out.    Family History  Problem Relation Age of Onset  . Asthma Mother   . Anxiety disorder Mother   . Lung disease Mother   .  COPD Sister      Prior to Admission medications   Medication Sig Start Date End Date Taking? Authorizing Provider  ALPRAZolam Prudy Feeler) 0.5 MG tablet TAKE 1/2 TO 1 TABLET BY MOUTH TWICE DAILY AS NEEDED ANXIETY 12/20/19   Margaretann Loveless, PA-C  aspirin 81 MG tablet Take 81 mg by mouth daily.    [provider]  augmented betamethasone dipropionate (DIPROLENE-AF) 0.05 % cream APPLY TO AFFECTED AREA(s) TWICE DAILY 01/17/20   Margaretann Loveless, PA-C  bisoprolol-hydrochlorothiazide [facility]) 10-6.25 MG tablet Take 1 tablet by mouth  daily. 01/17/20   Margaretann Loveless, PA-C  Boswellia-Glucosamine-Vit D (GLUCOSAMINE COMPLEX PO) Take by mouth daily as needed. 1500    [provider]  cholecalciferol (VITAMIN D) 1000 UNITS tablet Take 1,000 Units by mouth.     [provider]  glucose blood test strip Use to test blood sugar once a day.  DX: E11.9. 11/13/15   Lorie Phenix, MD  metFORMIN (GLUCOPHAGE) 500 MG tablet Take 1 tablet (500 mg total) by mouth 2 (two) times daily. 01/17/20   Margaretann Loveless, PA-C  OMEGA-3 FATTY ACIDS PO Take by mouth.     [provider]  triamcinolone cream (KENALOG) 0.1 % APPLY TO AFFECTED AREA(s) TWICE DAILY 04/17/20   Margaretann Loveless, PA-C  vitamin B-12 (CYANOCOBALAMIN) 1000 MCG tablet Take 1 tablet (1,000 mcg total) by mouth daily. 07/15/15   Lorie Phenix, MD  VITAMIN E-1000 PO Take by mouth.     [provider]  augmented betamethasone dipropionate (DIPROLENE-AF) 0.05 % cream APPLY TO AFFECTED AREA(s) TWICE DAILY 04/18/19   Margaretann Loveless, [place]    Physical Exam: Vitals:   04/21/20 0615 04/21/20 0630 04/21/20 0700 04/21/20 0803  BP:   (!) 136/101 (!) 152/90  Pulse: 72 74 73 72  Resp: Temp:      SpO2: 98% 99% 100% 97%  Weight:      Height:         Vitals:   04/21/20 0615 04/21/20 0630 04/21/20 0700 04/21/20 0803  BP:   (!) 136/101 (!) 152/90  Pulse: 72 74 73 72  Resp: Temp:      SpO2: 98% 99% 100% 97%  Weight:      Height:        Constitutional: Anxious, agitated, hematoma of the right forehead Eyes: PERRL, lids and conjunctivae normal ENMT: Mucous membranes are moist.  Neck: normal, supple, no masses, no thyromegaly Respiratory: clear to auscultation bilaterally, no wheezing, no crackles. Normal respiratory effort. No accessory muscle use.  Cardiovascular: Regular rate and rhythm, no murmurs / rubs / gallops. No extremity edema. 2+ pedal pulses. No carotid bruits.  Abdomen: no tenderness, no masses  palpated. No hepatosplenomegaly. Bowel sounds positive.  Musculoskeletal: no clubbing / cyanosis. Decreased range of motion right hip Skin: no rashes, lesions, ulcers. Ecchymosis over right hip Neurologic: Moves all extremities person Psychiatric: Agitated and anxious   Labs on Admission: I have personally reviewed following labs and imaging studies  CBC: Recent Labs  Lab 04/20/20 2301  WBC 17.4*  HGB 10.5*  HCT 30.1*  MCV 86.5  PLT 248   Basic Metabolic Panel: Recent Labs  Lab 04/20/20 2301  NA 140  K 4.7  CL 103  CO2 25  GLUCOSE 228*  BUN 29*  CREATININE 0.97  CALCIUM 9.4   GFR: Estimated Creatinine Clearance: 31 mL/min (by C-G formula based on SCr of 0.97 mg/dL). Liver  Function Tests: Recent Labs  Lab 04/20/20 2301  AST 30  ALT 18  ALKPHOS 39  BILITOT 1.0  PROT 6.1*  ALBUMIN 3.7   No results for input(s): LIPASE, AMYLASE in the last 168 hours. No results for input(s): AMMONIA in the last 168 hours. Coagulation Profile: No results for input(s): INR, PROTIME in the last 168 hours. Cardiac Enzymes: Recent Labs  Lab 04/20/20 2301  CKTOTAL 85   BNP (last 3 results) No results for input(s): PROBNP in the last 8760 hours. HbA1C: No results for input(s): HGBA1C in the last 72 hours. CBG: No results for input(s): GLUCAP in the last 168 hours. Lipid Profile: No results for input(s): CHOL, HDL, LDLCALC, TRIG, CHOLHDL, LDLDIRECT in the last 72 hours. Thyroid Function Tests: No results for input(s): TSH, T4TOTAL, FREET4, T3FREE, THYROIDAB in the last 72 hours. Anemia Panel: No results for input(s): VITAMINB12, FOLATE, FERRITIN, TIBC, IRON, RETICCTPCT in the last 72 hours. Urine analysis:    Component Value Date/Time   COLORURINE YELLOW (A) 04/21/2020 0743   APPEARANCEUR HAZY (A) 04/21/2020 0743   LABSPEC 1.023 04/21/2020 0743   PHURINE 5.0 04/21/2020 0743   GLUCOSEU 50 (A) 04/21/2020 0743   HGBUR NEGATIVE 04/21/2020 0743   BILIRUBINUR NEGATIVE  04/21/2020 0743   KETONESUR 5 (A) 04/21/2020 0743   PROTEINUR 30 (A) 04/21/2020 0743   NITRITE NEGATIVE 04/21/2020 0743   LEUKOCYTESUR NEGATIVE 04/21/2020 0743    Radiological Exams on Admission: DG Pelvis 1-2 Views  Result Date: 04/20/2020 CLINICAL DATA:  86 year old female with fall. EXAM: PELVIS - 1-2 VIEW COMPARISON:  None. FINDINGS: There is no acute fracture or dislocation. The bones are osteopenic. Mild arthritic changes of the hips. The soft tissues are unremarkable. IMPRESSION: No acute fracture or dislocation. Electronically Signed   By: Anner Crete M.D.   On: 04/20/2020 23:00   CT Head Wo Contrast  Result Date: 04/21/2020 CLINICAL DATA:  Fall out of bed in the urgency department EXAM: CT HEAD WITHOUT CONTRAST TECHNIQUE: Contiguous axial images were obtained from the base of the skull through the vertex without intravenous contrast. COMPARISON:  Apr 21, 2020 4:37 a.m. FINDINGS: Brain: Again noted is a small amount of mixed hyperdense subdural hemorrhage seen overlying the right frontal lobe. No new extra-axial collections are seen. There is dilatation the ventricles and sulci consistent with age-related atrophy. Low-attenuation changes in the deep white matter consistent with small vessel ischemia. Vascular: No hyperdense vessel or unexpected calcification. Skull: The skull is intact. No fracture or focal lesion identified. Sinuses/Orbits: The visualized paranasal sinuses and mastoid air cells are clear. The orbits and globes intact. Other: Soft tissue hematoma again noted over the right frontal skull. Cervical spine: Alignment: There is straightening of the normal cervical lordosis. Skull base and vertebrae: Visualized skull base is intact. No atlanto-occipital dissociation. The vertebral body heights are well maintained. No fracture or pathologic osseous lesion seen. Soft tissues and spinal canal: The visualized paraspinal soft tissues are unremarkable. No prevertebral soft tissue  swelling is seen. The spinal canal is grossly unremarkable, no large epidural collection or significant canal narrowing. Disc levels: Multilevel cervical spine spondylosis is seen with large anterior osteophytes, disc osteophyte complex and uncovertebral osteophytes most notable at C3 through C6 with severe neural foraminal narrowing and moderate central canal stenosis. Upper chest: The lung apices are clear. Thoracic inlet is within normal limits. Other: None IMPRESSION: No significant change in the small amount of mixed density subdural hemorrhage overlying the right frontal lobe. Findings consistent with  age related atrophy and chronic small vessel ischemia No acute fracture or malalignment of the spine. Multilevel cervical spine spondylosis most notable from C3 through C6. Electronically Signed   By: Jonna Clark M.D.   On: 04/21/2020 05:51   CT Head Wo Contrast  Result Date: 04/21/2020 CLINICAL DATA:  Follow-up of subdural hematoma EXAM: CT HEAD WITHOUT CONTRAST TECHNIQUE: Contiguous axial images were obtained from the base of the skull through the vertex without intravenous contrast. COMPARISON:  04/20/2020 FINDINGS: Brain: Small amount of right convexity mixed subdural and subarachnoid blood is unchanged. Unchanged prominence of the extra-axial CSF spaces. There is periventricular hypoattenuation compatible with chronic microvascular disease. Vascular: Atherosclerotic calcification of the vertebral and internal carotid arteries at the skull base. No abnormal hyperdensity of the major intracranial arteries or dural venous sinuses. Skull: Right parietal scalp hematoma.  No skull fracture. Sinuses/Orbits: No fluid levels or advanced mucosal thickening of the visualized paranasal sinuses. No mastoid or middle ear effusion. The orbits are normal. IMPRESSION: 1. Unchanged small amount of right convexity mixed subdural and subarachnoid blood. 2. Right parietal scalp hematoma without skull fracture.  Electronically Signed   By: Deatra Robinson M.D.   On: 04/21/2020 04:37   CT Head Wo Contrast  Result Date: 04/20/2020 CLINICAL DATA:  Head trauma, fall EXAM: CT HEAD WITHOUT CONTRAST TECHNIQUE: Contiguous axial images were obtained from the base of the skull through the vertex without intravenous contrast. COMPARISON:  January 19, 2019 FINDINGS: Brain: There is a tiny amount of mixed hyperdense subdural overlying the right frontal lobe measuring approximately 36mm in maximum diameter best seen on series 2, image 10. No midline shift is seen. There is dilatation the ventricles and sulci consistent with age-related atrophy. Low-attenuation changes in the deep white matter consistent with small vessel ischemia. Vascular: No hyperdense vessel or unexpected calcification. Skull: The skull is intact. No fracture or focal lesion identified. Sinuses/Orbits: The visualized paranasal sinuses and mastoid air cells are clear. The orbits and globes intact. Other: There is a soft tissue hematoma seen overlying the right frontal skull measuring 3 cm in maximum diameter. IMPRESSION: Tiny amount of mixed density subdural hemorrhage overlying the right frontal lobe measuring 4 mm in maximum diameter. No midline shift. Findings consistent with age related atrophy and chronic small vessel ischemia Soft tissue hematoma overlying the right frontal skull. Electronically Signed   By: Jonna Clark M.D.   On: 04/20/2020 22:51   CT Cervical Spine Wo Contrast  Result Date: 04/21/2020 CLINICAL DATA:  Fall out of bed in the urgency department EXAM: CT HEAD WITHOUT CONTRAST TECHNIQUE: Contiguous axial images were obtained from the base of the skull through the vertex without intravenous contrast. COMPARISON:  Apr 21, 2020 4:37 a.m. FINDINGS: Brain: Again noted is a small amount of mixed hyperdense subdural hemorrhage seen overlying the right frontal lobe. No new extra-axial collections are seen. There is dilatation the ventricles and sulci  consistent with age-related atrophy. Low-attenuation changes in the deep white matter consistent with small vessel ischemia. Vascular: No hyperdense vessel or unexpected calcification. Skull: The skull is intact. No fracture or focal lesion identified. Sinuses/Orbits: The visualized paranasal sinuses and mastoid air cells are clear. The orbits and globes intact. Other: Soft tissue hematoma again noted over the right frontal skull. Cervical spine: Alignment: There is straightening of the normal cervical lordosis. Skull base and vertebrae: Visualized skull base is intact. No atlanto-occipital dissociation. The vertebral body heights are well maintained. No fracture or pathologic osseous lesion seen. Soft  tissues and spinal canal: The visualized paraspinal soft tissues are unremarkable. No prevertebral soft tissue swelling is seen. The spinal canal is grossly unremarkable, no large epidural collection or significant canal narrowing. Disc levels: Multilevel cervical spine spondylosis is seen with large anterior osteophytes, disc osteophyte complex and uncovertebral osteophytes most notable at C3 through C6 with severe neural foraminal narrowing and moderate central canal stenosis. Upper chest: The lung apices are clear. Thoracic inlet is within normal limits. Other: None IMPRESSION: No significant change in the small amount of mixed density subdural hemorrhage overlying the right frontal lobe. Findings consistent with age related atrophy and chronic small vessel ischemia No acute fracture or malalignment of the spine. Multilevel cervical spine spondylosis most notable from C3 through C6. Electronically Signed   By: Jonna Clark M.D.   On: 04/21/2020 05:51   DG Chest Portable 1 View  Result Date: 04/20/2020 CLINICAL DATA:  Weakness EXAM: PORTABLE CHEST 1 VIEW COMPARISON:  January 19, 2019 FINDINGS: The heart size and mediastinal contours are within normal limits. Aortic knob calcifications are seen. Both lungs are  clear. The visualized skeletal structures are unremarkable. IMPRESSION: No active disease. Electronically Signed   By: Jonna Clark M.D.   On: 04/20/2020 22:22   DG Hip Unilat W or Wo Pelvis 2-3 Views Left  Result Date: 04/21/2020 CLINICAL DATA:  Fall from stretcher, dementia EXAM: DG HIP (WITH OR WITHOUT PELVIS) 2-3V LEFT COMPARISON:  None. FINDINGS: No pelvic fracture or diastasis. No left hip fracture or dislocation. No suspicious focal osseous lesions. Mild left hip osteoarthritis. Prominent degenerative changes in the visualized lower lumbar spine. IMPRESSION: No fracture. No left hip dislocation. Mild left hip osteoarthritis. Prominent degenerative changes in the visualized lower lumbar spine. Electronically Signed   By: Delbert Phenix M.D.   On: 04/21/2020 06:16   DG Hip Unilat W or Wo Pelvis 2-3 Views Right  Result Date: 04/21/2020 CLINICAL DATA:  Fall from stretcher, dementia EXAM: DG HIP (WITH OR WITHOUT PELVIS) 2-3V RIGHT COMPARISON:  04/20/2020 pelvic radiograph FINDINGS: Lateral right hip soft tissue swelling. No pelvic fracture or diastasis. No right hip fracture or dislocation. Mild right hip osteoarthritis. No suspicious focal osseous lesions. Prominent degenerative changes in the visualized lower lumbar spine. IMPRESSION: Lateral right hip soft tissue swelling. No right hip fracture or dislocation. Mild right hip osteoarthritis. Electronically Signed   By: Delbert Phenix M.D.   On: 04/21/2020 06:18    EKG: Independently reviewed.  Supraventricular tachycardia  Assessment/Plan Principal Problem:   Subdural hemorrhage (HCC) Active Problems:   Diabetes (HCC)   Anxiety, generalized   Fall at home, initial encounter    Status post fall Patient is an 86 year old Caucasian female who lives independently and was brought into the emergency room for evaluation of a fall Patient did not want to come to the emergency room and her niece had to take out IVC papers on her to get her evaluated  CT scan of the head without contrast shows: tiny amount of mixed density subdural hemorrhage overlying the right frontal lobe measuring 4 mm in maximum diameter. No midline shift. Findings consistent with age related atrophy and chronic small vessel ischemia Soft tissue hematoma overlying the right frontal skull. Neurosurgery was consulted and they recommend to repeat a CT scan of the head without contrast in 24 hours Patient had another fall in the ED which was unwitnessed and will be placed on fall precautions She will need to be placed on one-to-one safety precautions   Diabetes  mellitus Maintain consistent carbohydrate diet Place patient on glycemic control with insulin   Anxiety Continue scheduled Xanax   Hypertension Continue bisoprolol/HCTZ   Leukocytosis Unclear etiology may be stress related We will obtain urine analysis    DVT prophylaxis: SCD Code Status: Full code Family Communication: Greater than 50% of time was spent discussing patient's condition and plan of care with her niece, Andreas Ohm over the phone. Patient does not have a living will and does not have a designated healthcare power of attorney. She is okay with patient going to subacute rehab but her long-term goal is for patient to return home Disposition Plan: Back to previous home environment Consults called: Neurosurgery    Tochukwu Agbata MD [facility]     04/21/2020, 8:51 AM

## 2020-04-21 NOTE — ED Notes (Signed)
Attempted to take BP again and pt is unable to tolerate, again screams when taking and tenses up entire body

## 2020-04-21 NOTE — ED Notes (Signed)
Pt refusing water and refusing PO meds. Able to eat 3 bites of applesauce with PRN med. Pt states she does not want. Pt writhing around in bed, yelling. Sitter at bedside. Pt clean and dry, positioned for comfort.

## 2020-04-21 NOTE — ED Notes (Signed)
Pt observed by this writer to have a unresponsive episode, pt observed to be bradycardic and unresponsive to verbal and painful stimuli. RN Runell Gess notified, Consulting civil engineer, and MD Roxan Hockey notified. Pt placed on zoll monitor and taken to CT by this Clinical research associate ad Secondary school teacher. Pt returned to room 1
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
## 2020-04-21 NOTE — ED Notes (Signed)
Pt has 1:1 sitter  Pt is IVC

## 2020-04-21 NOTE — ED Notes (Signed)
Pt writhing around in bed, yelling and appears uncomfortable. Sitter at beside attempting to deescalate patient. Will secure chat rounding to inform.

## 2020-04-21 NOTE — ED Provider Notes (Signed)
Called to bedside by nursing staff to assess patient who had reportedly become altered less responsive bradycardic and snoring respiration.  On evaluation patient has pinpoint pupils.  Does grimace to sternal rub.  Does have spontaneous but slow respirations.  She was given 1 of Narcan with improvement in symptoms.  I suspect medication effect secondary to Percocet.  Have ordered repeat CT imaging.  Does not seem consistent with CVA.  Her CBG was normal.  No dysrhythmia on the monitor.   Willy Eddy, MD 04/21/20 1043

## 2020-04-21 NOTE — ED Notes (Signed)
Sitter at bedside.

## 2020-04-21 NOTE — ED Notes (Signed)
meds counted and taken to pharmacy to secure.

## 2020-04-21 NOTE — ED Notes (Signed)
Patient's bed alarm sounded. Sierra Lloyd immediately to room. Patient had crawled out of the top of bed and was found on back on floor. Patient assisted back to bed by this Lloyd, Sierra Lloyd, Barbette Hair, Silvano Rusk, and MD Dolores Frame. Primary MD Don Perking notified. CT head and neck ordered. Safety sitter ordered and came to bedside.

## 2020-04-21 NOTE — ED Notes (Signed)
Secure chat Lucile Shutters, MD regarding pt episode of bradycardia and decreased responsiveness requiring EDP intervention. Pt now in ED 1 to be closely monitoring. Sitter at bedside.

## 2020-04-21 NOTE — Progress Notes (Signed)
PROGRESS NOTE    Sierra Lloyd  QIW:[id number] DOB: 04-09-1934 DOA: 04/20/2020 PCP: Margaretann Loveless, PA-C   Brief Narrative:  Informed by RN that patient became less responsive, bradycardic with heart rates in the 30s and had sonorous respiration.  On exam patient does have pinpoint pupils and was able to grimace to sternal rub.  Patient received a dose of Narcan with improvement in her mental status.  Will hold opioids for now She is back to her baseline mental status Assessment & Plan:   Principal Problem:   Subdural hemorrhage [facility]) Active Problems:   Diabetes (HCC)   Anxiety, generalized   Fall at home, initial encounter   Fall      DVT prophylaxis: (Lovenox/Heparin/SCD's/anticoagulated/None (if comfort care) Code Status: (Full/Partial - specify details) Family Communication: (Specify name, relationship & date discussed. NO "discussed with patient") Disposition Plan: (specify when and where you expect patient to be discharged). Include barriers to DC in this tab.   Consultants:     Procedures: (Don't include imaging studies which can be auto populated. Include things that cannot be auto populated i.e. Echo, Carotid and venous dopplers, Foley, Bipap, HD, tubes/drains, wound vac, central lines etc)    Antimicrobials: (specify start and planned stop date. Auto populated tables are space occupying and do not give end dates)     Subjective:  Objective: Vitals:   04/21/20 0615 04/21/20 0630 04/21/20 0700 04/21/20 0803  BP:   (!) 136/101 (!) 152/90  Pulse: 72 74 73 72  Resp: 17 16 16 18   Temp:      SpO2: 98% 99% 100% 97%  Weight:      Height:       No intake or output data in the 24 hours ending 04/21/20 1113 Filed Weights   04/20/20 2122  Weight: 50.6 kg    Examination:  General exam: Appears calm and comfortable  Respiratory system: Clear to auscultation. Respiratory effort normal. Cardiovascular system: S1 & S2 heard, RRR. No JVD, murmurs, rubs,  gallops or clicks. No pedal edema. Gastrointestinal system: Abdomen is nondistended, soft and nontender. No organomegaly or masses felt. Normal bowel sounds heard. Central nervous system: Alert and oriented. No focal neurological deficits. Extremities: Symmetric 5 x 5 power. Skin: No rashes, lesions or ulcers Psychiatry: Judgement and insight appear normal. Mood & affect appropriate.     Data Reviewed: I have personally reviewed following labs and imaging studies  CBC: Recent Labs  Lab 04/20/20 2301  WBC 17.4*  HGB 10.5*  HCT 30.1*  MCV 86.5  PLT 248   Basic Metabolic Panel: Recent Labs  Lab 04/20/20 2301  NA 140  K 4.7  CL 103  CO2 25  GLUCOSE 228*  BUN 29*  CREATININE 0.97  CALCIUM 9.4   GFR: Estimated Creatinine Clearance: 31 mL/min (by C-G formula based on SCr of 0.97 mg/dL). Liver Function Tests: Recent Labs  Lab 04/20/20 2301  AST 30  ALT 18  ALKPHOS 39  BILITOT 1.0  PROT 6.1*  ALBUMIN 3.7   No results for input(s): LIPASE, AMYLASE in the last 168 hours. No results for input(s): AMMONIA in the last 168 hours. Coagulation Profile: No results for input(s): INR, PROTIME in the last 168 hours. Cardiac Enzymes: Recent Labs  Lab 04/20/20 2301  CKTOTAL 85   BNP (last 3 results) No results for input(s): PROBNP in the last 8760 hours. HbA1C: No results for input(s): HGBA1C in the last 72 hours. CBG: Recent Labs  Lab 04/21/20 1025  GLUCAP  149*   Lipid Profile: No results for input(s): CHOL, HDL, LDLCALC, TRIG, CHOLHDL, LDLDIRECT in the last 72 hours. Thyroid Function Tests: No results for input(s): TSH, T4TOTAL, FREET4, T3FREE, THYROIDAB in the last 72 hours. Anemia Panel: No results for input(s): VITAMINB12, FOLATE, FERRITIN, TIBC, IRON, RETICCTPCT in the last 72 hours. Sepsis Labs: No results for input(s): PROCALCITON, LATICACIDVEN in the last 168 hours.  Recent Results (from the past 240 hour(s))  SARS Coronavirus 2 by RT PCR (hospital  order, performed in [facility] hospital lab) Nasopharyngeal Nasopharyngeal Swab     Status: None   Collection Time: 04/21/20  7:43 AM   Specimen: Nasopharyngeal Swab  Result Value Ref Range Status   SARS Coronavirus 2 NEGATIVE NEGATIVE Final    Comment: (NOTE) SARS-CoV-2 target nucleic acids are NOT DETECTED. The SARS-CoV-2 RNA is generally detectable in upper and lower respiratory specimens during the acute phase of infection. The lowest concentration of SARS-CoV-2 viral copies this assay can detect is 250 copies / mL. A negative result does not preclude SARS-CoV-2 infection and should not be used as the sole basis for treatment or other patient management decisions.  A negative result may occur with improper specimen collection / handling, submission of specimen other than nasopharyngeal swab, presence of viral mutation(s) within the areas targeted by this assay, and inadequate number of viral copies (<250 copies / mL). A negative result must be combined with clinical observations, patient history, and epidemiological information. Fact Sheet for Patients:   BoilerBrush.com.cy Fact Sheet for Healthcare Providers: https://pope.com/ This test is not yet approved or cleared  by the [place] FDA and has been authorized for detection and/or diagnosis of SARS-CoV-2 by FDA under an Emergency Use Authorization (EUA).  This EUA will remain in effect (meaning this test can be used) for the duration of the COVID-19 declaration under Section 564(b)(1) of the Act, 21 U.S.C. section 360bbb-3(b)(1), unless the authorization is terminated or revoked sooner. Performed at [facility], [address] [place], [place] [address]          Radiology Studies: DG Pelvis 1-2 Views  Result Date: 04/20/2020 CLINICAL DATA:  86 year old female with fall. EXAM: PELVIS - 1-2 VIEW COMPARISON:  None. FINDINGS: There is no acute fracture or  dislocation. The bones are osteopenic. Mild arthritic changes of the hips. The soft tissues are unremarkable. IMPRESSION: No acute fracture or dislocation. Electronically Signed   By: Elgie Collard M.D.   On: 04/20/2020 23:00   DG Knee 1-2 Views Right  Result Date: 04/21/2020 CLINICAL DATA:  Patient found down.  Bruising to right hip area. EXAM: RIGHT KNEE - 1-2 VIEW COMPARISON:  None. FINDINGS: While this is described as a right knee film, images are instead obtained of most of the femur. The right femoral head evaluation is limited due to positioning. Evaluation of the proximal tibia and fibula is limited as well. No fractures are seen within the femur. The proximal tibia and fibula are normal without fracture within visualize limits. Tricompartmental degenerative changes are seen. No joint effusion. IMPRESSION: This is an unusual study which consists of images of most of the femur. Evaluation of the hip and the knee are both limited due to positioning of the images. Within visualized limits, no femoral fractures are identified. No joint effusion in the knee. Limited views of the proximal tibia and fibula demonstrate no obvious fracture. Tricompartmental degenerative changes are identified. If there is concern for a hip fracture, dedicated images should be obtained. If there  is concern for fracture in the knee, dedicated images of the knee should be obtained. Electronically Signed   By: Dorise Bullion III M.D   On: 04/21/2020 09:47   CT HEAD WO CONTRAST  Result Date: 04/21/2020 CLINICAL DATA:  Follow-up subdural hematoma seen earlier this morning. EXAM: CT HEAD WITHOUT CONTRAST TECHNIQUE: Contiguous axial images were obtained from the base of the skull through the vertex without intravenous contrast. COMPARISON:  Apr 21, 2020 FINDINGS: Brain: The small amount of subdural blood adjacent to the right frontal parietal region is stable without mass effect. No midline shift. No new subdural or epidural  hemorrhage. No subarachnoid hemorrhage. A lacunar infarct in the right cerebellum is stable. No acute cerebellar abnormalities. The brainstem and basal cisterns are normal. The sulci are prominent but stable. The ventricles are normal. Scattered white matter changes are identified. No acute cortical ischemia or infarct. Vascular: Calcified atherosclerosis in the intracranial carotids. Skull: Normal. Negative for fracture or focal lesion. Sinuses/Orbits: No acute finding. Other: Soft tissue swelling over the right lateral scalp. Extracranial soft tissues otherwise normal. IMPRESSION: 1. The small amount of subdural hemorrhage on the right is stable. No mass effect. No midline shift. 2. Chronic white matter changes. 3. No other acute abnormalities. Electronically Signed   By: Dorise Bullion III M.D   On: 04/21/2020 10:48   CT Head Wo Contrast  Result Date: 04/21/2020 CLINICAL DATA:  Fall out of bed in the urgency department EXAM: CT HEAD WITHOUT CONTRAST TECHNIQUE: Contiguous axial images were obtained from the base of the skull through the vertex without intravenous contrast. COMPARISON:  Apr 21, 2020 4:37 a.m. FINDINGS: Brain: Again noted is a small amount of mixed hyperdense subdural hemorrhage seen overlying the right frontal lobe. No new extra-axial collections are seen. There is dilatation the ventricles and sulci consistent with age-related atrophy. Low-attenuation changes in the deep white matter consistent with small vessel ischemia. Vascular: No hyperdense vessel or unexpected calcification. Skull: The skull is intact. No fracture or focal lesion identified. Sinuses/Orbits: The visualized paranasal sinuses and mastoid air cells are clear. The orbits and globes intact. Other: Soft tissue hematoma again noted over the right frontal skull. Cervical spine: Alignment: There is straightening of the normal cervical lordosis. Skull base and vertebrae: Visualized skull base is intact. No atlanto-occipital  dissociation. The vertebral body heights are well maintained. No fracture or pathologic osseous lesion seen. Soft tissues and spinal canal: The visualized paraspinal soft tissues are unremarkable. No prevertebral soft tissue swelling is seen. The spinal canal is grossly unremarkable, no large epidural collection or significant canal narrowing. Disc levels: Multilevel cervical spine spondylosis is seen with large anterior osteophytes, disc osteophyte complex and uncovertebral osteophytes most notable at C3 through C6 with severe neural foraminal narrowing and moderate central canal stenosis. Upper chest: The lung apices are clear. Thoracic inlet is within normal limits. Other: None IMPRESSION: No significant change in the small amount of mixed density subdural hemorrhage overlying the right frontal lobe. Findings consistent with age related atrophy and chronic small vessel ischemia No acute fracture or malalignment of the spine. Multilevel cervical spine spondylosis most notable from C3 through C6. Electronically Signed   By: Prudencio Pair M.D.   On: 04/21/2020 05:51   CT Head Wo Contrast  Result Date: 04/21/2020 CLINICAL DATA:  Follow-up of subdural hematoma EXAM: CT HEAD WITHOUT CONTRAST TECHNIQUE: Contiguous axial images were obtained from the base of the skull through the vertex without intravenous contrast. COMPARISON:  04/20/2020 FINDINGS: Brain:  Small amount of right convexity mixed subdural and subarachnoid blood is unchanged. Unchanged prominence of the extra-axial CSF spaces. There is periventricular hypoattenuation compatible with chronic microvascular disease. Vascular: Atherosclerotic calcification of the vertebral and internal carotid arteries at the skull base. No abnormal hyperdensity of the major intracranial arteries or dural venous sinuses. Skull: Right parietal scalp hematoma.  No skull fracture. Sinuses/Orbits: No fluid levels or advanced mucosal thickening of the visualized paranasal sinuses.  No mastoid or middle ear effusion. The orbits are normal. IMPRESSION: 1. Unchanged small amount of right convexity mixed subdural and subarachnoid blood. 2. Right parietal scalp hematoma without skull fracture. Electronically Signed   By: Deatra RobinsonKevin  Herman M.D.   On: 04/21/2020 04:37   CT Head Wo Contrast  Result Date: 04/20/2020 CLINICAL DATA:  Head trauma, fall EXAM: CT HEAD WITHOUT CONTRAST TECHNIQUE: Contiguous axial images were obtained from the base of the skull through the vertex without intravenous contrast. COMPARISON:  January 19, 2019 FINDINGS: Brain: There is a tiny amount of mixed hyperdense subdural overlying the right frontal lobe measuring approximately 4mm in maximum diameter best seen on series 2, image 10. No midline shift is seen. There is dilatation the ventricles and sulci consistent with age-related atrophy. Low-attenuation changes in the deep white matter consistent with small vessel ischemia. Vascular: No hyperdense vessel or unexpected calcification. Skull: The skull is intact. No fracture or focal lesion identified. Sinuses/Orbits: The visualized paranasal sinuses and mastoid air cells are clear. The orbits and globes intact. Other: There is a soft tissue hematoma seen overlying the right frontal skull measuring 3 cm in maximum diameter. IMPRESSION: Tiny amount of mixed density subdural hemorrhage overlying the right frontal lobe measuring 4 mm in maximum diameter. No midline shift. Findings consistent with age related atrophy and chronic small vessel ischemia Soft tissue hematoma overlying the right frontal skull. Electronically Signed   By: Jonna ClarkBindu  Avutu M.D.   On: 04/20/2020 22:51   CT Cervical Spine Wo Contrast  Result Date: 04/21/2020 CLINICAL DATA:  Fall out of bed in the urgency department EXAM: CT HEAD WITHOUT CONTRAST TECHNIQUE: Contiguous axial images were obtained from the base of the skull through the vertex without intravenous contrast. COMPARISON:  Apr 21, 2020 4:37 a.m.  FINDINGS: Brain: Again noted is a small amount of mixed hyperdense subdural hemorrhage seen overlying the right frontal lobe. No new extra-axial collections are seen. There is dilatation the ventricles and sulci consistent with age-related atrophy. Low-attenuation changes in the deep white matter consistent with small vessel ischemia. Vascular: No hyperdense vessel or unexpected calcification. Skull: The skull is intact. No fracture or focal lesion identified. Sinuses/Orbits: The visualized paranasal sinuses and mastoid air cells are clear. The orbits and globes intact. Other: Soft tissue hematoma again noted over the right frontal skull. Cervical spine: Alignment: There is straightening of the normal cervical lordosis. Skull base and vertebrae: Visualized skull base is intact. No atlanto-occipital dissociation. The vertebral body heights are well maintained. No fracture or pathologic osseous lesion seen. Soft tissues and spinal canal: The visualized paraspinal soft tissues are unremarkable. No prevertebral soft tissue swelling is seen. The spinal canal is grossly unremarkable, no large epidural collection or significant canal narrowing. Disc levels: Multilevel cervical spine spondylosis is seen with large anterior osteophytes, disc osteophyte complex and uncovertebral osteophytes most notable at C3 through C6 with severe neural foraminal narrowing and moderate central canal stenosis. Upper chest: The lung apices are clear. Thoracic inlet is within normal limits. Other: None IMPRESSION: No significant change  in the small amount of mixed density subdural hemorrhage overlying the right frontal lobe. Findings consistent with age related atrophy and chronic small vessel ischemia No acute fracture or malalignment of the spine. Multilevel cervical spine spondylosis most notable from C3 through C6. Electronically Signed   By: Jonna Clark M.D.   On: 04/21/2020 05:51   DG Chest Portable 1 View  Result Date:  04/20/2020 CLINICAL DATA:  Weakness EXAM: PORTABLE CHEST 1 VIEW COMPARISON:  January 19, 2019 FINDINGS: The heart size and mediastinal contours are within normal limits. Aortic knob calcifications are seen. Both lungs are clear. The visualized skeletal structures are unremarkable. IMPRESSION: No active disease. Electronically Signed   By: Jonna Clark M.D.   On: 04/20/2020 22:22   DG Hip Unilat W or Wo Pelvis 2-3 Views Left  Result Date: 04/21/2020 CLINICAL DATA:  Fall from stretcher, dementia EXAM: DG HIP (WITH OR WITHOUT PELVIS) 2-3V LEFT COMPARISON:  None. FINDINGS: No pelvic fracture or diastasis. No left hip fracture or dislocation. No suspicious focal osseous lesions. Mild left hip osteoarthritis. Prominent degenerative changes in the visualized lower lumbar spine. IMPRESSION: No fracture. No left hip dislocation. Mild left hip osteoarthritis. Prominent degenerative changes in the visualized lower lumbar spine. Electronically Signed   By: Delbert Phenix M.D.   On: 04/21/2020 06:16   DG Hip Unilat W or Wo Pelvis 2-3 Views Right  Result Date: 04/21/2020 CLINICAL DATA:  Fall from stretcher, dementia EXAM: DG HIP (WITH OR WITHOUT PELVIS) 2-3V RIGHT COMPARISON:  04/20/2020 pelvic radiograph FINDINGS: Lateral right hip soft tissue swelling. No pelvic fracture or diastasis. No right hip fracture or dislocation. Mild right hip osteoarthritis. No suspicious focal osseous lesions. Prominent degenerative changes in the visualized lower lumbar spine. IMPRESSION: Lateral right hip soft tissue swelling. No right hip fracture or dislocation. Mild right hip osteoarthritis. Electronically Signed   By: Delbert Phenix M.D.   On: 04/21/2020 06:18        Scheduled Meds: . bisoprolol-hydrochlorothiazide  1 tablet Oral Daily  . cholecalciferol  1,000 Units Oral Daily  . insulin aspart  0-15 Units Subcutaneous TID WC  . naLOXone (NARCAN)  injection  1 mg Intravenous Once  . omega-3 acid ethyl esters  1 g Oral Daily   . sodium chloride flush  3 mL Intravenous Q12H  . vitamin B-12  1,000 mcg Oral Daily   Continuous Infusions: . sodium chloride       LOS: 0 days    Time spent:     Lucile Shutters, MD [facility] Pager [phone]  If 7PM-7AM, please contact night-coverage [email or web address] Password [email or web address] 04/21/2020, 11:13 AM

## 2020-04-21 NOTE — ED Notes (Signed)
IVC   PENDING  MED  FLOOR

## 2020-04-22 ENCOUNTER — Encounter: Payer: Self-pay | Admitting: Internal Medicine

## 2020-04-22 ENCOUNTER — Inpatient Hospital Stay: Payer: Medicare Other

## 2020-04-22 LAB — BASIC METABOLIC PANEL
Anion gap: 11 (ref 5–15)
BUN: 44 mg/dL — ABNORMAL HIGH (ref 8–23)
CO2: 26 mmol/L (ref 22–32)
Calcium: 9.4 mg/dL (ref 8.9–10.3)
Chloride: 102 mmol/L (ref 98–111)
Creatinine, Ser: 1.36 mg/dL — ABNORMAL HIGH (ref 0.44–1.00)
GFR calc Af Amer: 41 mL/min — ABNORMAL LOW (ref >60)
GFR calc non Af Amer: 36 mL/min — ABNORMAL LOW (ref >60)
Glucose, Bld: 128 mg/dL — ABNORMAL HIGH (ref 70–99)
Potassium: 4.1 mmol/L (ref 3.5–5.1)
Sodium: 139 mmol/L (ref 135–145)

## 2020-04-22 LAB — CBC
HCT: 25.3 % — ABNORMAL LOW (ref 36.0–46.0)
Hemoglobin: 8.7 g/dL — ABNORMAL LOW (ref 12.0–15.0)
MCH: 30 pg (ref 26.0–34.0)
MCHC: 34.4 g/dL (ref 30.0–36.0)
MCV: 87.2 fL (ref 80.0–100.0)
Platelets: 203 10*3/uL (ref 150–400)
RBC: 2.9 MIL/uL — ABNORMAL LOW (ref 3.87–5.11)
RDW: 13 % (ref 11.5–15.5)
WBC: 13.7 10*3/uL — ABNORMAL HIGH (ref 4.0–10.5)
nRBC: 0 % (ref 0.0–0.2)

## 2020-04-22 MED ORDER — OXYCODONE HCL 5 MG PO TABS
5.0000 mg | ORAL_TABLET | Freq: Three times a day (TID) | ORAL | Status: DC | PRN
  Administered 2020-04-22 – 2020-05-09 (×24): 5 mg via ORAL
  Filled 2020-04-22 (×24): qty 1

## 2020-04-22 MED ORDER — LORAZEPAM 2 MG/ML IJ SOLN
1.0000 mg | Freq: Once | INTRAMUSCULAR | Status: AC
  Administered 2020-04-22: 1 mg via INTRAVENOUS
  Filled 2020-04-22: qty 1

## 2020-04-22 NOTE — Progress Notes (Signed)
Pt continues to scream, yell and toss and turn in bed. Sitter present at the bedside. Pt was able to remove mitten and pulled out 1 of her 2 IV access. Author administered Xanax 0.5mg  PO @1403  and 5mg  Oxycodone PO @ 1526 for c/o pain in right arm/elbow. Pt still continues to yell "Help Me" and "Please don't hurt me". She is screaming that her right arm is hurting as she twist and turns it refusing to left nurse prop her arm or adequately assess RUE.  MD notified. RN will continue to monitor for further deviations from current baseline.

## 2020-04-22 NOTE — ED Notes (Signed)
Sierra Lloyd called this RN to room d/t pt trying to get out of bed and trying to pull IV's out. Attempting to redirect pt.

## 2020-04-22 NOTE — ED Notes (Signed)
While applying mitts to pt, pt yelling quit and kicking RN and NT  Attempting to pull gloves off in between knees

## 2020-04-22 NOTE — Progress Notes (Signed)
Progress Note    Sierra Residesatricia A Sebastiani  UEA:[id number] DOB: 03/18/1934  DOA: 04/20/2020 PCP: [name], Jennifer M, PA-C      Brief Narrative:    Medical records reviewed and are as summarized below:  Sierra ResidesPatricia A Lloyd is an 86 y.o. female with medical history significant for diabetes mellitus, hypertension and anxiety who presented to the ER for evaluation after a fall. Patient refused to come to the ER and her niece had to take out IVC papers to be able to get her evaluated. Patient noted to have small right frontal subdural hemorrhage, right forehead  hematoma neurosurgeon consulted in the emergency room recommended conservative management and repeat CT head.  Follow-up repeat CT head did not show any worsening of subdural hemorrhage.       Assessment/Plan:   Principal Problem:   Subdural hemorrhage (HCC) Active Problems:   Diabetes (HCC)   Anxiety, generalized   Fall at home, initial encounter   Fall   S/p fall, small right frontal lobe subdural hemorrhage, right scalp soft tissue hematoma: Repeat CT head today (04/22/2020) did not show any worsening of subdural hemorrhage.  Avoid antiplatelets or anticoagulants.  No surgical intervention per neurosurgeon.  Acute confusional state/altered mental status/delirium: It is not clear if this is related to subdural hemorrhage.  Supportive care.  Patient is under IVC.  AKI: Hydrate with IV fluids.  Monitor BMP.  Leukocytosis: Improving.  This may be reactive.  Repeat CBC tomorrow.  Hypertension: Continue antihypertensives  Anxiety: Xanax as needed for anxiety  Body mass index is 21.79 kg/m.   Family Communication/Anticipated D/C date and plan/Code Status   DVT prophylaxis: SCD Code Status: Full code Family Communication: None Disposition Plan:    Status is: Inpatient  Remains inpatient appropriate because:Unsafe d/c plan and Inpatient level of care appropriate due to severity of illness   Dispo: The patient is from:  Home              Anticipated d/c is to: Home              Anticipated d/c date is: 2 days              Patient currently is not medically stable to d/c.            Subjective:   Patient is confused unable to provide any history.  According to nursing staff, patient was very agitated and combative earlier today.  Objective:    Vitals:   04/22/20 0400 04/22/20 0430 04/22/20 0500 04/22/20 0900  BP: (!) 128/56 (!) 143/58 (!) 154/57   Pulse: 66 60 66   Resp:  18  16  Temp:    99.4 F (37.4 C)  TempSrc:    Axillary  SpO2: 100% 99% 99% 98%  Weight:      Height:       No data found.  No intake or output data in the 24 hours ending 04/22/20 1353 Filed Weights   04/20/20 2122  Weight: 50.6 kg    Exam:  GEN: NAD SKIN: Swelling on the right forehead, extensive bruising/ecchymosis on the right buttock, right hip, right side of the abdomen, right side of her face EYES: No pallor or icterus ENT: MMM CV: RRR PULM: CTA B ABD: soft, ND, NT, +BS CNS: Drowsy, confused EXT: No edema or tenderness   Data Reviewed:   I have personally reviewed following labs and imaging studies:  Labs: Labs show the following:   Basic Metabolic Panel:  Recent Labs  Lab 04/20/20 2301 04/22/20 0909  NA 140 139  K 4.7 4.1  CL 103 102  CO2 25 26  GLUCOSE 228* 128*  BUN 29* 44*  CREATININE 0.97 1.36*  CALCIUM 9.4 9.4   GFR Estimated Creatinine Clearance: 22.1 mL/min (A) (by C-G formula based on SCr of 1.36 mg/dL (H)). Liver Function Tests: Recent Labs  Lab 04/20/20 2301  AST 30  ALT 18  ALKPHOS 39  BILITOT 1.0  PROT 6.1*  ALBUMIN 3.7   No results for input(s): LIPASE, AMYLASE in the last 168 hours. No results for input(s): AMMONIA in the last 168 hours. Coagulation profile No results for input(s): INR, PROTIME in the last 168 hours.  CBC: Recent Labs  Lab 04/20/20 2301 04/22/20 0909  WBC 17.4* 13.7*  HGB 10.5* 8.7*  HCT 30.1* 25.3*  MCV 86.5 87.2  PLT 248 203    Cardiac Enzymes: Recent Labs  Lab 04/20/20 2301  CKTOTAL 85   BNP (last 3 results) No results for input(s): PROBNP in the last 8760 hours. CBG: Recent Labs  Lab 04/21/20 1025 04/21/20 1923  GLUCAP 149* 128*   D-Dimer: No results for input(s): DDIMER in the last 72 hours. Hgb A1c: Recent Labs    04/20/20 2301  HGBA1C 7.0*   Lipid Profile: No results for input(s): CHOL, HDL, LDLCALC, TRIG, CHOLHDL, LDLDIRECT in the last 72 hours. Thyroid function studies: No results for input(s): TSH, T4TOTAL, T3FREE, THYROIDAB in the last 72 hours.  Invalid input(s): FREET3 Anemia work up: No results for input(s): VITAMINB12, FOLATE, FERRITIN, TIBC, IRON, RETICCTPCT in the last 72 hours. Sepsis Labs: Recent Labs  Lab 04/20/20 2301 04/22/20 0909  WBC 17.4* 13.7*    Microbiology Recent Results (from the past 240 hour(s))  SARS Coronavirus 2 by RT PCR (hospital order, performed in [facility] hospital lab) Nasopharyngeal Nasopharyngeal Swab     Status: None   Collection Time: 04/21/20  7:43 AM   Specimen: Nasopharyngeal Swab  Result Value Ref Range Status   SARS Coronavirus 2 NEGATIVE NEGATIVE Final    Comment: (NOTE) SARS-CoV-2 target nucleic acids are NOT DETECTED. The SARS-CoV-2 RNA is generally detectable in upper and lower respiratory specimens during the acute phase of infection. The lowest concentration of SARS-CoV-2 viral copies this assay can detect is 250 copies / mL. A negative result does not preclude SARS-CoV-2 infection and should not be used as the sole basis for treatment or other patient management decisions.  A negative result may occur with improper specimen collection / handling, submission of specimen other than nasopharyngeal swab, presence of viral mutation(s) within the areas targeted by this assay, and inadequate number of viral copies (<250 copies / mL). A negative result must be combined with clinical observations, patient history, and  epidemiological information. Fact Sheet for Patients:   BoilerBrush.com.cy Fact Sheet for Healthcare Providers: https://pope.com/ This test is not yet approved or cleared  by the [place] FDA and has been authorized for detection and/or diagnosis of SARS-CoV-2 by FDA under an Emergency Use Authorization (EUA).  This EUA will remain in effect (meaning this test can be used) for the duration of the COVID-19 declaration under Section 564(b)(1) of the Act, 21 U.S.C. section 360bbb-3(b)(1), unless the authorization is terminated or revoked sooner. Performed at [facility], [address], [place], [place] [address]     Procedures and diagnostic studies:  DG Pelvis 1-2 Views  Result Date: 04/20/2020 CLINICAL DATA:  86 year old female with fall. EXAM: PELVIS -  1-2 VIEW COMPARISON:  None. FINDINGS: There is no acute fracture or dislocation. The bones are osteopenic. Mild arthritic changes of the hips. The soft tissues are unremarkable. IMPRESSION: No acute fracture or dislocation. Electronically Signed   By: Elgie Collard M.D.   On: 04/20/2020 23:00   DG Knee 1-2 Views Right  Result Date: 04/21/2020 CLINICAL DATA:  Patient found down.  Bruising to right hip area. EXAM: RIGHT KNEE - 1-2 VIEW COMPARISON:  None. FINDINGS: While this is described as a right knee film, images are instead obtained of most of the femur. The right femoral head evaluation is limited due to positioning. Evaluation of the proximal tibia and fibula is limited as well. No fractures are seen within the femur. The proximal tibia and fibula are normal without fracture within visualize limits. Tricompartmental degenerative changes are seen. No joint effusion. IMPRESSION: This is an unusual study which consists of images of most of the femur. Evaluation of the hip and the knee are both limited due to positioning of the images. Within visualized limits, no femoral  fractures are identified. No joint effusion in the knee. Limited views of the proximal tibia and fibula demonstrate no obvious fracture. Tricompartmental degenerative changes are identified. If there is concern for a hip fracture, dedicated images should be obtained. If there is concern for fracture in the knee, dedicated images of the knee should be obtained. Electronically Signed   By: Gerome Sam III M.D   On: 04/21/2020 09:47   CT Head Wo Contrast  Result Date: 04/22/2020 CLINICAL DATA:  Mental status change, follow-up subdural hematoma EXAM: CT HEAD WITHOUT CONTRAST TECHNIQUE: Contiguous axial images were obtained from the base of the skull through the vertex without intravenous contrast. COMPARISON:  04/21/2020, 10:34 a.m., 04/21/2020, 5:36 a.m. FINDINGS: Brain: Redemonstrated trace acute right frontal subdural hemorrhage is not significantly changed. There are possible small underlying bilateral chronic bifrontal hygromas versus prominent subdural spaces secondary to global volume loss. No hydrocephalus. No midline shift. Periventricular and deep white matter hypodensity. Vascular: No hyperdense vessel or unexpected calcification. Skull: Normal. Negative for fracture or focal lesion. Sinuses/Orbits: No acute finding. Other: Redemonstrated scalp hematoma of the right forehead. IMPRESSION: 1. Redemonstrated trace acute right frontal subdural hemorrhage is not significantly changed. No significant mass effect or midline shift. 2. There are possible small underlying bilateral chronic bifrontal hygromas versus prominent subdural spaces secondary to global volume loss. 3. Redemonstrated scalp hematoma of the right forehead. 4. Small-vessel white matter disease. Electronically Signed   By: Lauralyn Primes M.D.   On: 04/22/2020 11:44   CT HEAD WO CONTRAST  Result Date: 04/21/2020 CLINICAL DATA:  Follow-up subdural hematoma seen earlier this morning. EXAM: CT HEAD WITHOUT CONTRAST TECHNIQUE: Contiguous axial  images were obtained from the base of the skull through the vertex without intravenous contrast. COMPARISON:  Apr 21, 2020 FINDINGS: Brain: The small amount of subdural blood adjacent to the right frontal parietal region is stable without mass effect. No midline shift. No new subdural or epidural hemorrhage. No subarachnoid hemorrhage. A lacunar infarct in the right cerebellum is stable. No acute cerebellar abnormalities. The brainstem and basal cisterns are normal. The sulci are prominent but stable. The ventricles are normal. Scattered white matter changes are identified. No acute cortical ischemia or infarct. Vascular: Calcified atherosclerosis in the intracranial carotids. Skull: Normal. Negative for fracture or focal lesion. Sinuses/Orbits: No acute finding. Other: Soft tissue swelling over the right lateral scalp. Extracranial soft tissues otherwise normal. IMPRESSION: 1. The small  amount of subdural hemorrhage on the right is stable. No mass effect. No midline shift. 2. Chronic white matter changes. 3. No other acute abnormalities. Electronically Signed   By: Dorise Bullion III M.D   On: 04/21/2020 10:48   CT Head Wo Contrast  Result Date: 04/21/2020 CLINICAL DATA:  Fall out of bed in the urgency department EXAM: CT HEAD WITHOUT CONTRAST TECHNIQUE: Contiguous axial images were obtained from the base of the skull through the vertex without intravenous contrast. COMPARISON:  Apr 21, 2020 4:37 a.m. FINDINGS: Brain: Again noted is a small amount of mixed hyperdense subdural hemorrhage seen overlying the right frontal lobe. No new extra-axial collections are seen. There is dilatation the ventricles and sulci consistent with age-related atrophy. Low-attenuation changes in the deep white matter consistent with small vessel ischemia. Vascular: No hyperdense vessel or unexpected calcification. Skull: The skull is intact. No fracture or focal lesion identified. Sinuses/Orbits: The visualized paranasal sinuses and  mastoid air cells are clear. The orbits and globes intact. Other: Soft tissue hematoma again noted over the right frontal skull. Cervical spine: Alignment: There is straightening of the normal cervical lordosis. Skull base and vertebrae: Visualized skull base is intact. No atlanto-occipital dissociation. The vertebral body heights are well maintained. No fracture or pathologic osseous lesion seen. Soft tissues and spinal canal: The visualized paraspinal soft tissues are unremarkable. No prevertebral soft tissue swelling is seen. The spinal canal is grossly unremarkable, no large epidural collection or significant canal narrowing. Disc levels: Multilevel cervical spine spondylosis is seen with large anterior osteophytes, disc osteophyte complex and uncovertebral osteophytes most notable at C3 through C6 with severe neural foraminal narrowing and moderate central canal stenosis. Upper chest: The lung apices are clear. Thoracic inlet is within normal limits. Other: None IMPRESSION: No significant change in the small amount of mixed density subdural hemorrhage overlying the right frontal lobe. Findings consistent with age related atrophy and chronic small vessel ischemia No acute fracture or malalignment of the spine. Multilevel cervical spine spondylosis most notable from C3 through C6. Electronically Signed   By: Prudencio Pair M.D.   On: 04/21/2020 05:51   CT Head Wo Contrast  Result Date: 04/21/2020 CLINICAL DATA:  Follow-up of subdural hematoma EXAM: CT HEAD WITHOUT CONTRAST TECHNIQUE: Contiguous axial images were obtained from the base of the skull through the vertex without intravenous contrast. COMPARISON:  04/20/2020 FINDINGS: Brain: Small amount of right convexity mixed subdural and subarachnoid blood is unchanged. Unchanged prominence of the extra-axial CSF spaces. There is periventricular hypoattenuation compatible with chronic microvascular disease. Vascular: Atherosclerotic calcification of the vertebral  and internal carotid arteries at the skull base. No abnormal hyperdensity of the major intracranial arteries or dural venous sinuses. Skull: Right parietal scalp hematoma.  No skull fracture. Sinuses/Orbits: No fluid levels or advanced mucosal thickening of the visualized paranasal sinuses. No mastoid or middle ear effusion. The orbits are normal. IMPRESSION: 1. Unchanged small amount of right convexity mixed subdural and subarachnoid blood. 2. Right parietal scalp hematoma without skull fracture. Electronically Signed   By: Ulyses Jarred M.D.   On: 04/21/2020 04:37   CT Head Wo Contrast  Result Date: 04/20/2020 CLINICAL DATA:  Head trauma, fall EXAM: CT HEAD WITHOUT CONTRAST TECHNIQUE: Contiguous axial images were obtained from the base of the skull through the vertex without intravenous contrast. COMPARISON:  January 19, 2019 FINDINGS: Brain: There is a tiny amount of mixed hyperdense subdural overlying the right frontal lobe measuring approximately 1mm in maximum diameter best seen  on series 2, image 10. No midline shift is seen. There is dilatation the ventricles and sulci consistent with age-related atrophy. Low-attenuation changes in the deep white matter consistent with small vessel ischemia. Vascular: No hyperdense vessel or unexpected calcification. Skull: The skull is intact. No fracture or focal lesion identified. Sinuses/Orbits: The visualized paranasal sinuses and mastoid air cells are clear. The orbits and globes intact. Other: There is a soft tissue hematoma seen overlying the right frontal skull measuring 3 cm in maximum diameter. IMPRESSION: Tiny amount of mixed density subdural hemorrhage overlying the right frontal lobe measuring 4 mm in maximum diameter. No midline shift. Findings consistent with age related atrophy and chronic small vessel ischemia Soft tissue hematoma overlying the right frontal skull. Electronically Signed   By: Jonna Clark M.D.   On: 04/20/2020 22:51   CT Cervical  Spine Wo Contrast  Result Date: 04/21/2020 CLINICAL DATA:  Fall out of bed in the urgency department EXAM: CT HEAD WITHOUT CONTRAST TECHNIQUE: Contiguous axial images were obtained from the base of the skull through the vertex without intravenous contrast. COMPARISON:  Apr 21, 2020 4:37 a.m. FINDINGS: Brain: Again noted is a small amount of mixed hyperdense subdural hemorrhage seen overlying the right frontal lobe. No new extra-axial collections are seen. There is dilatation the ventricles and sulci consistent with age-related atrophy. Low-attenuation changes in the deep white matter consistent with small vessel ischemia. Vascular: No hyperdense vessel or unexpected calcification. Skull: The skull is intact. No fracture or focal lesion identified. Sinuses/Orbits: The visualized paranasal sinuses and mastoid air cells are clear. The orbits and globes intact. Other: Soft tissue hematoma again noted over the right frontal skull. Cervical spine: Alignment: There is straightening of the normal cervical lordosis. Skull base and vertebrae: Visualized skull base is intact. No atlanto-occipital dissociation. The vertebral body heights are well maintained. No fracture or pathologic osseous lesion seen. Soft tissues and spinal canal: The visualized paraspinal soft tissues are unremarkable. No prevertebral soft tissue swelling is seen. The spinal canal is grossly unremarkable, no large epidural collection or significant canal narrowing. Disc levels: Multilevel cervical spine spondylosis is seen with large anterior osteophytes, disc osteophyte complex and uncovertebral osteophytes most notable at C3 through C6 with severe neural foraminal narrowing and moderate central canal stenosis. Upper chest: The lung apices are clear. Thoracic inlet is within normal limits. Other: None IMPRESSION: No significant change in the small amount of mixed density subdural hemorrhage overlying the right frontal lobe. Findings consistent with age  related atrophy and chronic small vessel ischemia No acute fracture or malalignment of the spine. Multilevel cervical spine spondylosis most notable from C3 through C6. Electronically Signed   By: Jonna Clark M.D.   On: 04/21/2020 05:51   DG Chest Portable 1 View  Result Date: 04/20/2020 CLINICAL DATA:  Weakness EXAM: PORTABLE CHEST 1 VIEW COMPARISON:  January 19, 2019 FINDINGS: The heart size and mediastinal contours are within normal limits. Aortic knob calcifications are seen. Both lungs are clear. The visualized skeletal structures are unremarkable. IMPRESSION: No active disease. Electronically Signed   By: Jonna Clark M.D.   On: 04/20/2020 22:22   DG Hip Unilat W or Wo Pelvis 2-3 Views Left  Result Date: 04/21/2020 CLINICAL DATA:  Fall from stretcher, dementia EXAM: DG HIP (WITH OR WITHOUT PELVIS) 2-3V LEFT COMPARISON:  None. FINDINGS: No pelvic fracture or diastasis. No left hip fracture or dislocation. No suspicious focal osseous lesions. Mild left hip osteoarthritis. Prominent degenerative changes in the visualized lower  lumbar spine. IMPRESSION: No fracture. No left hip dislocation. Mild left hip osteoarthritis. Prominent degenerative changes in the visualized lower lumbar spine. Electronically Signed   By: Delbert Phenix M.D.   On: 04/21/2020 06:16   DG Hip Unilat W or Wo Pelvis 2-3 Views Right  Result Date: 04/21/2020 CLINICAL DATA:  Fall from stretcher, dementia EXAM: DG HIP (WITH OR WITHOUT PELVIS) 2-3V RIGHT COMPARISON:  04/20/2020 pelvic radiograph FINDINGS: Lateral right hip soft tissue swelling. No pelvic fracture or diastasis. No right hip fracture or dislocation. Mild right hip osteoarthritis. No suspicious focal osseous lesions. Prominent degenerative changes in the visualized lower lumbar spine. IMPRESSION: Lateral right hip soft tissue swelling. No right hip fracture or dislocation. Mild right hip osteoarthritis. Electronically Signed   By: Delbert Phenix M.D.   On: 04/21/2020 06:18      Medications:   . bisoprolol-hydrochlorothiazide  1 tablet Oral Daily  . cholecalciferol  1,000 Units Oral Daily  . insulin aspart  0-15 Units Subcutaneous TID WC  . omega-3 acid ethyl esters  1 g Oral Daily  . sodium chloride flush  3 mL Intravenous Q12H  . vitamin B-12  1,000 mcg Oral Daily   Continuous Infusions: . sodium chloride       LOS: 1 day   Rashelle Ireland  [facility]     04/22/2020, 1:53 PM

## 2020-04-22 NOTE — Progress Notes (Signed)
PT Cancellation Note  Patient Details Name: Sierra Lloyd MRN: [id number] DOB: 03-Apr-1934   Cancelled Treatment:    Reason Eval/Treat Not Completed: Fatigue/lethargy limiting ability to participate(PT evaluation attempted. Pt lethargic or sleeping throughout attempt. Pt minimally responsive to stimulation, does not appear to wake. Will attempt again at later date time, once patient is better able to participate.)  11:38 AM, 04/22/20 Rosamaria Lints, PT, DPT Physical Therapist - [facility]  [phone] (ASCOM)    [name] 04/22/2020, 11:38 AM

## 2020-04-22 NOTE — ED Notes (Signed)
PT has continued to fuss and yell and attempt to get out of bed while tech was performing pericare. Attempted to give pt her PRN xanax but pt refused multiple attempts. Have messaged NP.

## 2020-04-23 LAB — GLUCOSE, CAPILLARY
Glucose-Capillary: 111 mg/dL — ABNORMAL HIGH (ref 70–99)
Glucose-Capillary: 111 mg/dL — ABNORMAL HIGH (ref 70–99)
Glucose-Capillary: 182 mg/dL — ABNORMAL HIGH (ref 70–99)

## 2020-04-23 MED ORDER — MORPHINE SULFATE (PF) 2 MG/ML IV SOLN
1.0000 mg | INTRAVENOUS | Status: DC | PRN
  Administered 2020-04-23 – 2020-04-30 (×11): 1 mg via INTRAVENOUS
  Filled 2020-04-23 (×12): qty 1

## 2020-04-23 NOTE — Progress Notes (Signed)
PT Cancellation Note  Patient Details Name: Sierra Lloyd MRN: [id number] DOB: May 21, 1934   Cancelled Treatment:    Reason Eval/Treat Not Completed: Fatigue/lethargy limiting ability to participate;Patient's level of consciousness(Attempted evaluation. Pt awakens easily. Pt following simple commands <25% of time. Pt fully disoriented to time, situation. Delayed and inconsistent response to language. Pt easily agitated and hollering out when repositioning attempted.) Pt not able to participate in therapy at this time, similar to previous days. Will attempt evaluation again at later date/time.   9:31 AM, 04/23/20 Rosamaria Lints, PT, DPT Physical Therapist - [facility]  [phone] (ASCOM)    Philomene Haff C 04/23/2020, 9:30 AM

## 2020-04-23 NOTE — Progress Notes (Signed)
OT Cancellation Note  Patient Details Name: Sierra Lloyd MRN: [id number] DOB: August 05, 1934   Cancelled Treatment:    Reason Eval/Treat Not Completed: Patient's level of consciousness;Other (comment). Thank you for the OT consult. Order received and chart reviewed. Per chart/conversation with physical therapist pt unable to consistently follow commands. Disoriented at this time and easily agitated. Currently unable to participate in therapy session. Will continue to follow and initiate services as available and pt able to participate in therapy.   Rockney Ghee, M.S., OTR/L Ascom: [phone] 04/23/20, 10:02 AM

## 2020-04-23 NOTE — Progress Notes (Signed)
PROGRESS NOTE    Sierra Lloyd  HGD:[id number] DOB: [birth year] DOA: 04/20/2020 PCP: Margaretann Loveless, PA-C       Assessment & Plan:   Principal Problem:   Subdural hemorrhage [facility]) Active Problems:   Diabetes (HCC)   Anxiety, generalized   Fall at home, initial encounter   Fall   Right frontal lobe subdural hemorrhage: w/ right scalp soft tissue hematoma. S/p fall.  Repeat CT head today (04/22/2020) did not show any worsening of subdural hemorrhage.  Avoid antiplatelets or anticoagulants.  No surgical intervention per neurosurgeon.  Altered mental status/delirium: possible secondary to subdural hemorrhage. Supportive care.  Patient is under IVC. Psychiatry consulted   AKI: Cr is labile. Will continue to monitor   Leukocytosis: likely reactive. Will continue to monitor   Hypertension: continue on bisoprolol-HCTZ  Anxiety: severity unknown. Xanax prn    DVT prophylaxis: SCDs Code Status: full  Family Communication:  Disposition Plan: depends on PT/OT recs    Consultants:  Neurosurgery   Procedures:   Antimicrobials:    Subjective: Pt c/o pain everywhere.   Objective: Vitals:   04/22/20 0430 04/22/20 0500 04/22/20 0900 04/22/20 1517  BP: (!) 143/58 (!) 154/57  (!) 156/71  Pulse: 60 66  85  Resp: 18  16 18   Temp:   99.4 F (37.4 C) 98.7 F (37.1 C)  TempSrc:   Axillary Axillary  SpO2: 99% 99% 98% 96%  Weight:      Height:        Intake/Output Summary (Last 24 hours) at 04/23/2020 0728 Last data filed at 04/22/2020 1435 Gross per 24 hour  Intake 60 ml  Output --  Net 60 ml   Filed Weights   04/20/20 2122  Weight: 50.6 kg    Examination:  General exam: Appears comfortable  Respiratory system: diminished breath sounds b/l Cardiovascular system: S1 & S2 +. No rubs, gallops or clicks.  Gastrointestinal system: Abdomen is nondistended, soft and nontender.  Normal bowel sounds heard. Central nervous system: Alert and oriented to person &  place only. Moves all 4 extremities.  Psychiatry: Judgement and insight appear abnormal. Agitated    Data Reviewed: I have personally reviewed following labs and imaging studies  CBC: Recent Labs  Lab 04/20/20 2301 04/22/20 0909  WBC 17.4* 13.7*  HGB 10.5* 8.7*  HCT 30.1* 25.3*  MCV 86.5 87.2  PLT 248 203   Basic Metabolic Panel: Recent Labs  Lab 04/20/20 2301 04/22/20 0909  NA 140 139  K 4.7 4.1  CL 103 102  CO2 25 26  GLUCOSE 228* 128*  BUN 29* 44*  CREATININE 0.97 1.36*  CALCIUM 9.4 9.4   GFR: Estimated Creatinine Clearance: 22.1 mL/min (A) (by C-G formula based on SCr of 1.36 mg/dL (H)). Liver Function Tests: Recent Labs  Lab 04/20/20 2301  AST 30  ALT 18  ALKPHOS 39  BILITOT 1.0  PROT 6.1*  ALBUMIN 3.7   No results for input(s): LIPASE, AMYLASE in the last 168 hours. No results for input(s): AMMONIA in the last 168 hours. Coagulation Profile: No results for input(s): INR, PROTIME in the last 168 hours. Cardiac Enzymes: Recent Labs  Lab 04/20/20 2301  CKTOTAL 85   BNP (last 3 results) No results for input(s): PROBNP in the last 8760 hours. HbA1C: Recent Labs    04/20/20 2301  HGBA1C 7.0*   CBG: Recent Labs  Lab 04/21/20 1025 04/21/20 1923  GLUCAP 149* 128*   Lipid Profile: No results for input(s): CHOL, HDL, LDLCALC,  TRIG, CHOLHDL, LDLDIRECT in the last 72 hours. Thyroid Function Tests: No results for input(s): TSH, T4TOTAL, FREET4, T3FREE, THYROIDAB in the last 72 hours. Anemia Panel: No results for input(s): VITAMINB12, FOLATE, FERRITIN, TIBC, IRON, RETICCTPCT in the last 72 hours. Sepsis Labs: No results for input(s): PROCALCITON, LATICACIDVEN in the last 168 hours.  Recent Results (from the past 240 hour(s))  SARS Coronavirus 2 by RT PCR (hospital order, performed in [facility] hospital lab) Nasopharyngeal Nasopharyngeal Swab     Status: None   Collection Time: 04/21/20  7:43 AM   Specimen: Nasopharyngeal Swab  Result Value  Ref Range Status   SARS Coronavirus 2 NEGATIVE NEGATIVE Final    Comment: (NOTE) SARS-CoV-2 target nucleic acids are NOT DETECTED. The SARS-CoV-2 RNA is generally detectable in upper and lower respiratory specimens during the acute phase of infection. The lowest concentration of SARS-CoV-2 viral copies this assay can detect is 250 copies / mL. A negative result does not preclude SARS-CoV-2 infection and should not be used as the sole basis for treatment or other patient management decisions.  A negative result may occur with improper specimen collection / handling, submission of specimen other than nasopharyngeal swab, presence of viral mutation(s) within the areas targeted by this assay, and inadequate number of viral copies (<250 copies / mL). A negative result must be combined with clinical observations, patient history, and epidemiological information. Fact Sheet for Patients:   StrictlyIdeas.no Fact Sheet for Healthcare Providers: BankingDealers.co.za This test is not yet approved or cleared  by the [place] FDA and has been authorized for detection and/or diagnosis of SARS-CoV-2 by FDA under an Emergency Use Authorization (EUA).  This EUA will remain in effect (meaning this test can be used) for the duration of the COVID-19 declaration under Section 564(b)(1) of the Act, 21 U.S.C. section 360bbb-3(b)(1), unless the authorization is terminated or revoked sooner. Performed at [facility], [address] [place], [place] [address]          Radiology Studies: DG Knee 1-2 Views Right  Result Date: 04/21/2020 CLINICAL DATA:  Patient found down.  Bruising to right hip area. EXAM: RIGHT KNEE - 1-2 VIEW COMPARISON:  None. FINDINGS: While this is described as a right knee film, images are instead obtained of most of the femur. The right femoral head evaluation is limited due to positioning. Evaluation of the proximal  tibia and fibula is limited as well. No fractures are seen within the femur. The proximal tibia and fibula are normal without fracture within visualize limits. Tricompartmental degenerative changes are seen. No joint effusion. IMPRESSION: This is an unusual study which consists of images of most of the femur. Evaluation of the hip and the knee are both limited due to positioning of the images. Within visualized limits, no femoral fractures are identified. No joint effusion in the knee. Limited views of the proximal tibia and fibula demonstrate no obvious fracture. Tricompartmental degenerative changes are identified. If there is concern for a hip fracture, dedicated images should be obtained. If there is concern for fracture in the knee, dedicated images of the knee should be obtained. Electronically Signed   By: Dorise Bullion III M.D   On: 04/21/2020 09:47   CT Head Wo Contrast  Result Date: 04/22/2020 CLINICAL DATA:  Mental status change, follow-up subdural hematoma EXAM: CT HEAD WITHOUT CONTRAST TECHNIQUE: Contiguous axial images were obtained from the base of the skull through the vertex without intravenous contrast. COMPARISON:  04/21/2020, 10:34 a.m., 04/21/2020, 5:36 a.m.  FINDINGS: Brain: Redemonstrated trace acute right frontal subdural hemorrhage is not significantly changed. There are possible small underlying bilateral chronic bifrontal hygromas versus prominent subdural spaces secondary to global volume loss. No hydrocephalus. No midline shift. Periventricular and deep white matter hypodensity. Vascular: No hyperdense vessel or unexpected calcification. Skull: Normal. Negative for fracture or focal lesion. Sinuses/Orbits: No acute finding. Other: Redemonstrated scalp hematoma of the right forehead. IMPRESSION: 1. Redemonstrated trace acute right frontal subdural hemorrhage is not significantly changed. No significant mass effect or midline shift. 2. There are possible small underlying bilateral  chronic bifrontal hygromas versus prominent subdural spaces secondary to global volume loss. 3. Redemonstrated scalp hematoma of the right forehead. 4. Small-vessel white matter disease. Electronically Signed   By: Lauralyn Primes M.D.   On: 04/22/2020 11:44   CT HEAD WO CONTRAST  Result Date: 04/21/2020 CLINICAL DATA:  Follow-up subdural hematoma seen earlier this morning. EXAM: CT HEAD WITHOUT CONTRAST TECHNIQUE: Contiguous axial images were obtained from the base of the skull through the vertex without intravenous contrast. COMPARISON:  Apr 21, 2020 FINDINGS: Brain: The small amount of subdural blood adjacent to the right frontal parietal region is stable without mass effect. No midline shift. No new subdural or epidural hemorrhage. No subarachnoid hemorrhage. A lacunar infarct in the right cerebellum is stable. No acute cerebellar abnormalities. The brainstem and basal cisterns are normal. The sulci are prominent but stable. The ventricles are normal. Scattered white matter changes are identified. No acute cortical ischemia or infarct. Vascular: Calcified atherosclerosis in the intracranial carotids. Skull: Normal. Negative for fracture or focal lesion. Sinuses/Orbits: No acute finding. Other: Soft tissue swelling over the right lateral scalp. Extracranial soft tissues otherwise normal. IMPRESSION: 1. The small amount of subdural hemorrhage on the right is stable. No mass effect. No midline shift. 2. Chronic white matter changes. 3. No other acute abnormalities. Electronically Signed   By: Gerome Sam III M.D   On: 04/21/2020 10:48        Scheduled Meds: . bisoprolol-hydrochlorothiazide  1 tablet Oral Daily  . cholecalciferol  1,000 Units Oral Daily  . insulin aspart  0-15 Units Subcutaneous TID WC  . omega-3 acid ethyl esters  1 g Oral Daily  . sodium chloride flush  3 mL Intravenous Q12H  . vitamin B-12  1,000 mcg Oral Daily   Continuous Infusions: . sodium chloride       LOS: 2 days     Time spent: 31 mins     Charise Killian, MD [facility] Pager [phone]  If 7PM-7AM, please contact night-coverage [email or web address] 04/23/2020, 7:28 AM

## 2020-04-24 ENCOUNTER — Inpatient Hospital Stay: Payer: Medicare Other

## 2020-04-24 DIAGNOSIS — S7002XA Contusion of left hip, initial encounter: Secondary | ICD-10-CM

## 2020-04-24 LAB — GLUCOSE, CAPILLARY
Glucose-Capillary: 126 mg/dL — ABNORMAL HIGH (ref 70–99)
Glucose-Capillary: 164 mg/dL — ABNORMAL HIGH (ref 70–99)
Glucose-Capillary: 183 mg/dL — ABNORMAL HIGH (ref 70–99)
Glucose-Capillary: 214 mg/dL — ABNORMAL HIGH (ref 70–99)
Glucose-Capillary: 95 mg/dL (ref 70–99)

## 2020-04-24 LAB — CBC
HCT: 23.5 % — ABNORMAL LOW (ref 36.0–46.0)
Hemoglobin: 7.9 g/dL — ABNORMAL LOW (ref 12.0–15.0)
MCH: 29.8 pg (ref 26.0–34.0)
MCHC: 33.6 g/dL (ref 30.0–36.0)
MCV: 88.7 fL (ref 80.0–100.0)
Platelets: 198 10*3/uL (ref 150–400)
RBC: 2.65 MIL/uL — ABNORMAL LOW (ref 3.87–5.11)
RDW: 13 % (ref 11.5–15.5)
WBC: 9.8 10*3/uL (ref 4.0–10.5)
nRBC: 0 % (ref 0.0–0.2)

## 2020-04-24 LAB — BASIC METABOLIC PANEL
Anion gap: 10 (ref 5–15)
BUN: 58 mg/dL — ABNORMAL HIGH (ref 8–23)
CO2: 29 mmol/L (ref 22–32)
Calcium: 9.9 mg/dL (ref 8.9–10.3)
Chloride: 101 mmol/L (ref 98–111)
Creatinine, Ser: 1.28 mg/dL — ABNORMAL HIGH (ref 0.44–1.00)
GFR calc Af Amer: 44 mL/min — ABNORMAL LOW (ref >60)
GFR calc non Af Amer: 38 mL/min — ABNORMAL LOW (ref >60)
Glucose, Bld: 217 mg/dL — ABNORMAL HIGH (ref 70–99)
Potassium: 4.5 mmol/L (ref 3.5–5.1)
Sodium: 140 mmol/L (ref 135–145)

## 2020-04-24 MED ORDER — ALPRAZOLAM 0.25 MG PO TABS
0.2500 mg | ORAL_TABLET | Freq: Three times a day (TID) | ORAL | Status: DC
  Administered 2020-04-25 – 2020-05-08 (×34): 0.25 mg via ORAL
  Filled 2020-04-24 (×35): qty 1

## 2020-04-24 MED ORDER — SENNOSIDES-DOCUSATE SODIUM 8.6-50 MG PO TABS
1.0000 | ORAL_TABLET | Freq: Two times a day (BID) | ORAL | Status: DC
  Administered 2020-04-24 – 2020-05-09 (×27): 1 via ORAL
  Filled 2020-04-24 (×29): qty 1

## 2020-04-24 MED ORDER — DULOXETINE HCL 20 MG PO CPEP
20.0000 mg | ORAL_CAPSULE | Freq: Every day | ORAL | Status: DC
  Administered 2020-04-27 – 2020-05-09 (×13): 20 mg via ORAL
  Filled 2020-04-24 (×17): qty 1

## 2020-04-24 MED ORDER — LORAZEPAM 2 MG/ML IJ SOLN
1.0000 mg | Freq: Once | INTRAMUSCULAR | Status: AC | PRN
  Administered 2020-04-24: 14:00:00 1 mg via INTRAVENOUS
  Filled 2020-04-24: qty 1

## 2020-04-24 MED ORDER — HYDRALAZINE HCL 50 MG PO TABS
50.0000 mg | ORAL_TABLET | Freq: Four times a day (QID) | ORAL | Status: DC | PRN
  Administered 2020-05-03: 50 mg via ORAL
  Filled 2020-04-24: qty 1

## 2020-04-24 MED ORDER — HALOPERIDOL 0.5 MG PO TABS
0.2500 mg | ORAL_TABLET | Freq: Two times a day (BID) | ORAL | Status: DC
  Administered 2020-04-26: 08:00:00 0.25 mg via ORAL
  Filled 2020-04-24 (×5): qty 0.5
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
## 2020-04-24 NOTE — Progress Notes (Signed)
OT Cancellation Note  Patient Details Name: Sierra Lloyd MRN: [id number] DOB: [birth year]   Cancelled Treatment:    Reason Eval/Treat Not Completed: Medical issues which prohibited therapy;Pain limiting ability to participate. OT continues to follow this pt. Upon arrival to pt room, pt with Physical Therapy in room for evaluation. Per physical therapist, pt extremely pain limited 2/2 significant bruising/hematoma on her R hip and side. Pt is oriented to self/place, and yells out "please don't hurt me" upon seeing this author. Pt currently not able to participate in OT evaluation. Will continue to follow and initiate services as available/pt medically appropriate.   Rockney Ghee, M.S., OTR/L Ascom: [phone] 04/24/20, 9:55 AM

## 2020-04-24 NOTE — Care Management Important Message (Signed)
Important Message  Patient Details  Name: Sierra Lloyd MRN: [id number] Date of Birth: November 25, 1933   Medicare Important Message Given:  Yes   Initial Medicare IM given by Patient Access Associate on 04/24/2020 at 10:33am.     [name] 04/24/2020, 12:13 PM

## 2020-04-24 NOTE — Evaluation (Signed)
Physical Therapy Evaluation Patient Details Name: SARAY CAPASSO MRN: [id number] DOB: 22-Jul-1934 Today's Date: 04/24/2020   History of Present Illness  presented to ER s/p fall with injury to R head, hip; admitted for management of R frontal SDH (63mm, stable).  R hip with significant ecchymosis and hematoma; initial xray negative for acute bony injury.  Clinical Impression  Patient awake in bed, sitter at bedside; constant moaning, yelling due to pain in R hip.  With constant cues for calming and redirection, patient able to answer simple questions; oriented to self and location as hospital.  Highly distractible by pain otherwise.   Notable ecchymosis and visible/palpable hematoma noted to R hip. Very poor tolerance for R hip palpation, with noted exacerbation in pain with any/all movement and WBing efforts with bed mobility.  Currently requiring max/total assist +2 for rolling and repositioning in bed. Unsafe/unable to tolerate additional mobility tasks due to R hip pain.  Question benefit of additional imaging to R hip to evaluate hematoma (HgB current 7.9) and further review for bony injury.  Discussed with attending; CT ordered.  Will continue to assess/progress as medically appropriate in subsequent sessions. Would benefit from skilled PT to address above deficits and promote optimal return to PLOF.; recommend transition to STR upon discharge from acute hospitalization.     Follow Up Recommendations SNF    Equipment Recommendations       Recommendations for Other Services       Precautions / Restrictions Precautions Precautions: Fall Restrictions Weight Bearing Restrictions: No      Mobility  Bed Mobility Overal bed mobility: Needs Assistance Bed Mobility: Rolling Rolling: Max assist;+2 for physical assistance            Transfers                 General transfer comment: unsafe/unable due to pain  Ambulation/Gait             General Gait Details:  unsafe/unable due to pain  Stairs            Wheelchair Mobility    Modified Rankin (Stroke Patients Only)       Balance                                             Pertinent Vitals/Pain Pain Assessment: Faces Faces Pain Scale: Hurts worst Pain Location: R hip Pain Descriptors / Indicators: Other (Comment)(yelling) Pain Intervention(s): Limited activity within patient's tolerance;Monitored during session;Patient requesting pain meds-RN notified;Repositioned    Home Living Family/patient expects to be discharged to:: Skilled nursing facility                 Additional Comments: Per chart, patient living alone prior to admission.  Patient poor historian; unable to provide information otherwise, family not available at this time    Prior Function           Comments: Per chart, patient living alone prior to admission.  Patient poor historian; unable to provide information otherwise, family not available at this time     Hand Dominance        Extremity/Trunk Assessment   Upper Extremity Assessment Upper Extremity Assessment: Generalized weakness(requires hand-over-hand, grossly at least 4-/5)    Lower Extremity Assessment Lower Extremity Assessment: Generalized weakness(difficult to fully assess due to R LE pain, guarding, yelling with attempts at movement; strength at  least 3-/5)       Communication   Communication: No difficulties(constant redirection to interview/conversation due to yelling/constant pain)  Cognition Arousal/Alertness: Awake/alert                                     General Comments: oriented to self, location as hospital, attempts to follow commands, but highly distracted by pain.  Requires constant redirection to interview/task at hand for calming and active participation/response with conversation      General Comments      Exercises     Assessment/Plan    PT Assessment Patient needs  continued PT services  PT Problem List Decreased strength;Decreased range of motion;Decreased activity tolerance;Decreased balance;Decreased mobility;Decreased cognition;Decreased knowledge of use of DME;Decreased safety awareness;Decreased knowledge of precautions;Pain;Decreased skin integrity       PT Treatment Interventions DME instruction;Gait training;Functional mobility training;Therapeutic activities;Stair training;Therapeutic exercise;Balance training;Neuromuscular re-education;Cognitive remediation;Patient/family education    PT Goals (Current goals can be found in the Care Plan section)  Acute Rehab PT Goals PT Goal Formulation: Patient unable to participate in goal setting Time For Goal Achievement: 05/08/20 Potential to Achieve Goals: Fair Additional Goals Additional Goal #1: Assess and establish goals for OOB mobility as medically appropriate.    Frequency Min 2X/week   Barriers to discharge Decreased caregiver support      Co-evaluation               AM-PAC PT "6 Clicks" Mobility  Outcome Measure Help needed turning from your back to your side while in a flat bed without using bedrails?: Total Help needed moving from lying on your back to sitting on the side of a flat bed without using bedrails?: Total Help needed moving to and from a bed to a chair (including a wheelchair)?: Total Help needed standing up from a chair using your arms (e.g., wheelchair or bedside chair)?: Total Help needed to walk in hospital room?: Total Help needed climbing 3-5 steps with a railing? : Total 6 Click Score: 6    End of Session   Activity Tolerance: Patient limited by pain Patient left: in bed;with call bell/phone within reach;with nursing/sitter in room Nurse Communication: Mobility status PT Visit Diagnosis: Difficulty in walking, not elsewhere classified (R26.2);Pain;History of falling (Z91.81) Pain - Right/Left: Right Pain - part of body: Hip    Time: 8295-6213 PT  Time Calculation (min) (ACUTE ONLY): 13 min   Charges:   PT Evaluation $PT Eval Moderate Complexity: 1 Mod          Elyas Villamor H. Manson Passey, PT, DPT, NCS 04/24/20, 10:21 AM [phone]

## 2020-04-24 NOTE — Progress Notes (Signed)
PROGRESS NOTE    Sierra Lloyd  ZES:[id number] DOB: July 15, 1934 DOA: 04/20/2020 PCP: Margaretann Loveless, PA-C       Assessment & Plan:   Principal Problem:   Subdural hemorrhage [facility]) Active Problems:   Diabetes (HCC)   Anxiety, generalized   Fall at home, initial encounter   Fall   Right frontal lobe subdural hemorrhage: w/ right scalp soft tissue hematoma. S/p fall.  Repeat CT head (04/22/2020) did not show any worsening of subdural hemorrhage.  Avoid antiplatelets or anticoagulants.  No surgical intervention per neurosurgeon.  Altered mental status/delirium: possible secondary to subdural hemorrhage. Supportive care.  Patient is under IVC. Psychiatry consulted & awaiting recs   Ecchymosis: of left hip, head & upper body. Secondary to fall at home. CT pelvis WO ordered  Normocytic anemia: w/ component of acute blood loss anemia. Possibly secondary to left hip hematoma after a fall at home   AKI: Cr is labile. Will continue to monitor   Leukocytosis: likely reactive. Will continue to monitor   Hypertension: continue on bisoprolol-HCTZ  Anxiety: severity unknown. Xanax prn    DVT prophylaxis: SCDs Code Status: full  Family Communication:  Disposition Plan: likely d/c to SNF. Unlikely any barriers  Status is: Inpatient  Remains inpatient appropriate because:Ongoing diagnostic testing needed not appropriate for outpatient work up   Dispo: The patient is from: Home              Anticipated d/c is to: SNF              Anticipated d/c date is:2 days              Patient currently is medically stable to d/c.     Consultants:  Neurosurgery   Procedures:   Antimicrobials:    Subjective: Pt c/o pain everywhere again today.   Objective: Vitals:   04/22/20 0500 04/22/20 0900 04/22/20 1517 04/23/20 0835  BP: (!) 154/57  (!) 156/71 (!) 173/68  Pulse: 66  85 90  Resp:  16 18 14   Temp:  99.4 F (37.4 C) 98.7 F (37.1 C)   TempSrc:  Axillary  Axillary   SpO2: 99% 98% 96% 100%  Weight:      Height:       No intake or output data in the 24 hours ending 04/24/20 0716 Filed Weights   04/20/20 2122  Weight: 50.6 kg    Examination:  General exam: Appears agitated Respiratory system: decreased breath sounds b/l Cardiovascular system: S1 & S2 +. No rubs, gallops or clicks.  Gastrointestinal system: Abdomen is nondistended, soft and nontender.  Normal bowel sounds heard. Central nervous system: Alert and oriented to person & place only. Moves all 4 extremities.  Psychiatry: Judgement and insight appear abnormal. Agitated & frustrated Skin: ecchymosis of left hip, head & upper body     Data Reviewed: I have personally reviewed following labs and imaging studies  CBC: Recent Labs  Lab 04/20/20 2301 04/22/20 0909 04/24/20 0427  WBC 17.4* 13.7* 9.8  HGB 10.5* 8.7* 7.9*  HCT 30.1* 25.3* 23.5*  MCV 86.5 87.2 88.7  PLT 248 203 198   Basic Metabolic Panel: Recent Labs  Lab 04/20/20 2301 04/22/20 0909 04/24/20 0427  NA 140 139 140  K 4.7 4.1 4.5  CL 103 102 101  CO2 25 26 29   GLUCOSE 228* 128* 217*  BUN 29* 44* 58*  CREATININE 0.97 1.36* 1.28*  CALCIUM 9.4 9.4 9.9   GFR: Estimated Creatinine Clearance: 23.5 mL/min (  A) (by C-G formula based on SCr of 1.28 mg/dL (H)). Liver Function Tests: Recent Labs  Lab 04/20/20 2301  AST 30  ALT 18  ALKPHOS 39  BILITOT 1.0  PROT 6.1*  ALBUMIN 3.7   No results for input(s): LIPASE, AMYLASE in the last 168 hours. No results for input(s): AMMONIA in the last 168 hours. Coagulation Profile: No results for input(s): INR, PROTIME in the last 168 hours. Cardiac Enzymes: Recent Labs  Lab 04/20/20 2301  CKTOTAL 85   BNP (last 3 results) No results for input(s): PROBNP in the last 8760 hours. HbA1C: No results for input(s): HGBA1C in the last 72 hours. CBG: Recent Labs  Lab 04/21/20 1923 04/23/20 0756 04/23/20 1142 04/23/20 1650 04/24/20 0012  GLUCAP 128* 111*  111* 182* 183*   Lipid Profile: No results for input(s): CHOL, HDL, LDLCALC, TRIG, CHOLHDL, LDLDIRECT in the last 72 hours. Thyroid Function Tests: No results for input(s): TSH, T4TOTAL, FREET4, T3FREE, THYROIDAB in the last 72 hours. Anemia Panel: No results for input(s): VITAMINB12, FOLATE, FERRITIN, TIBC, IRON, RETICCTPCT in the last 72 hours. Sepsis Labs: No results for input(s): PROCALCITON, LATICACIDVEN in the last 168 hours.  Recent Results (from the past 240 hour(s))  SARS Coronavirus 2 by RT PCR (hospital order, performed in [facility] hospital lab) Nasopharyngeal Nasopharyngeal Swab     Status: None   Collection Time: 04/21/20  7:43 AM   Specimen: Nasopharyngeal Swab  Result Value Ref Range Status   SARS Coronavirus 2 NEGATIVE NEGATIVE Final    Comment: (NOTE) SARS-CoV-2 target nucleic acids are NOT DETECTED. The SARS-CoV-2 RNA is generally detectable in upper and lower respiratory specimens during the acute phase of infection. The lowest concentration of SARS-CoV-2 viral copies this assay can detect is 250 copies / mL. A negative result does not preclude SARS-CoV-2 infection and should not be used as the sole basis for treatment or other patient management decisions.  A negative result may occur with improper specimen collection / handling, submission of specimen other than nasopharyngeal swab, presence of viral mutation(s) within the areas targeted by this assay, and inadequate number of viral copies (<250 copies / mL). A negative result must be combined with clinical observations, patient history, and epidemiological information. Fact Sheet for Patients:   BoilerBrush.com.cy Fact Sheet for Healthcare Providers: https://pope.com/ This test is not yet approved or cleared  by the [place] FDA and has been authorized for detection and/or diagnosis of SARS-CoV-2 by FDA under an Emergency Use Authorization (EUA).  This  EUA will remain in effect (meaning this test can be used) for the duration of the COVID-19 declaration under Section 564(b)(1) of the Act, 21 U.S.C. section 360bbb-3(b)(1), unless the authorization is terminated or revoked sooner. Performed at [facility], [address] [place], [place] [address]          Radiology Studies: CT Head Wo Contrast  Result Date: 04/22/2020 CLINICAL DATA:  Mental status change, follow-up subdural hematoma EXAM: CT HEAD WITHOUT CONTRAST TECHNIQUE: Contiguous axial images were obtained from the base of the skull through the vertex without intravenous contrast. COMPARISON:  04/21/2020, 10:34 a.m., 04/21/2020, 5:36 a.m. FINDINGS: Brain: Redemonstrated trace acute right frontal subdural hemorrhage is not significantly changed. There are possible small underlying bilateral chronic bifrontal hygromas versus prominent subdural spaces secondary to global volume loss. No hydrocephalus. No midline shift. Periventricular and deep white matter hypodensity. Vascular: No hyperdense vessel or unexpected calcification. Skull: Normal. Negative for fracture or focal lesion. Sinuses/Orbits: No acute finding.  Other: Redemonstrated scalp hematoma of the right forehead. IMPRESSION: 1. Redemonstrated trace acute right frontal subdural hemorrhage is not significantly changed. No significant mass effect or midline shift. 2. There are possible small underlying bilateral chronic bifrontal hygromas versus prominent subdural spaces secondary to global volume loss. 3. Redemonstrated scalp hematoma of the right forehead. 4. Small-vessel white matter disease. Electronically Signed   By: Eddie Candle M.D.   On: 04/22/2020 11:44        Scheduled Meds: . bisoprolol-hydrochlorothiazide  1 tablet Oral Daily  . cholecalciferol  1,000 Units Oral Daily  . insulin aspart  0-15 Units Subcutaneous TID WC  . omega-3 acid ethyl esters  1 g Oral Daily  . sodium chloride flush  3 mL Intravenous  Q12H  . vitamin B-12  1,000 mcg Oral Daily   Continuous Infusions: . sodium chloride       LOS: 3 days    Time spent: 30 mins     Wyvonnia Dusky, MD [facility] Pager [phone]  If 7PM-7AM, please contact night-coverage [email or web address] 04/24/2020, 7:16 AM

## 2020-04-25 LAB — BASIC METABOLIC PANEL
Anion gap: 9 (ref 5–15)
BUN: 63 mg/dL — ABNORMAL HIGH (ref 8–23)
CO2: 29 mmol/L (ref 22–32)
Calcium: 10 mg/dL (ref 8.9–10.3)
Chloride: 101 mmol/L (ref 98–111)
Creatinine, Ser: 1.34 mg/dL — ABNORMAL HIGH (ref 0.44–1.00)
GFR calc Af Amer: 42 mL/min — ABNORMAL LOW (ref >60)
GFR calc non Af Amer: 36 mL/min — ABNORMAL LOW (ref >60)
Glucose, Bld: 221 mg/dL — ABNORMAL HIGH (ref 70–99)
Potassium: 4.3 mmol/L (ref 3.5–5.1)
Sodium: 139 mmol/L (ref 135–145)

## 2020-04-25 LAB — GLUCOSE, CAPILLARY
Glucose-Capillary: 198 mg/dL — ABNORMAL HIGH (ref 70–99)
Glucose-Capillary: 209 mg/dL — ABNORMAL HIGH (ref 70–99)
Glucose-Capillary: 161 mg/dL — ABNORMAL HIGH (ref 70–99)
Glucose-Capillary: 135 mg/dL — ABNORMAL HIGH (ref 70–99)

## 2020-04-25 LAB — TSH: TSH: 1.223 u[IU]/mL (ref 0.350–4.500)

## 2020-04-25 LAB — MAGNESIUM: Magnesium: 2.4 mg/dL (ref 1.7–2.4)

## 2020-04-25 LAB — CBC
HCT: 22.6 % — ABNORMAL LOW (ref 36.0–46.0)
Hemoglobin: 7.6 g/dL — ABNORMAL LOW (ref 12.0–15.0)
MCH: 30.3 pg (ref 26.0–34.0)
MCHC: 33.6 g/dL (ref 30.0–36.0)
MCV: 90 fL (ref 80.0–100.0)
Platelets: 209 10*3/uL (ref 150–400)
RBC: 2.51 MIL/uL — ABNORMAL LOW (ref 3.87–5.11)
RDW: 13.3 % (ref 11.5–15.5)
WBC: 7.9 10*3/uL (ref 4.0–10.5)
nRBC: 0 % (ref 0.0–0.2)

## 2020-04-25 MED ORDER — ENSURE ENLIVE PO LIQD
237.0000 mL | Freq: Three times a day (TID) | ORAL | Status: DC
  Administered 2020-04-25 – 2020-05-09 (×27): 237 mL via ORAL

## 2020-04-25 MED ORDER — SODIUM CHLORIDE 0.9 % IV SOLN: INTRAVENOUS | Status: DC

## 2020-04-25 NOTE — Progress Notes (Signed)
PROGRESS NOTE    Sierra Lloyd  UKG:[id number] DOB: 31-Aug-1934 DOA: 04/20/2020 PCP: Margaretann Loveless, PA-C       Assessment & Plan:   Principal Problem:   Subdural hemorrhage [facility]) Active Problems:   Diabetes (HCC)   Anxiety, generalized   Fall at home, initial encounter   Fall   Right frontal lobe subdural hemorrhage: w/ right scalp soft tissue hematoma. S/p fall.  Repeat CT head (04/22/2020) did not show any worsening of subdural hemorrhage.  Avoid antiplatelets or anticoagulants.  No surgical intervention per neurosurgeon.  Altered mental status/delirium: possible secondary to subdural hemorrhage. Supportive care.  Patient is under IVC. Psychiatry following and recs apprec   Generalized anxiety/PTSD: started on xanax, cymbalta & haldol as per psych   Ecchymosis: of left hip, head & upper body. Secondary to fall at home. CT pelvis WO shows no fracture or dislocation, hematoma. Continue w/ supportive care   Normocytic anemia: w/ component of acute blood loss anemia. Possibly secondary to left hip hematoma after a fall at home. Will continue to monitor H&H  AKI: Cr is labile. Started back on IVFs. Will continue to monitor   Leukocytosis: resolved  Hypertension: continue on bisoprolol-HCTZ  Anxiety: severity unknown. Xanax prn    DVT prophylaxis: SCDs Code Status: full  Family Communication: discussed pt's care w/ pt's niece, Camelia Eng, and answered her questions  Disposition Plan: likely d/c to SNF. Unlikely any barriers  Status is: Inpatient  Remains inpatient appropriate because:Ongoing diagnostic testing needed not appropriate for outpatient work up   Dispo: The patient is from: Home              Anticipated d/c is to: SNF              Anticipated d/c date is:2 days              Patient currently is medically stable to d/c.   Consultants:  Neurosurgery   Procedures:   Antimicrobials:    Subjective: Pt is very lethargic today. Pt did not  verbalize any complaints.   Objective: Vitals:   04/24/20 0834 04/24/20 1620 04/24/20 2337 04/25/20 0707  BP: (!) 169/57 (!) 160/58 (!) 152/53 (!) 139/48  Pulse: 77 (!) 54 63 60  Resp: 17 16 15 15   Temp: 98.7 F (37.1 C) (!) 97.4 F (36.3 C) 98.3 F (36.8 C) 98.7 F (37.1 C)  TempSrc: Oral Oral    SpO2: 98% 98% 99% 98%  Weight:      Height:        Intake/Output Summary (Last 24 hours) at 04/25/2020 0723 Last data filed at 04/25/2020 0504 Gross per 24 hour  Intake --  Output 250 ml  Net -250 ml   Filed Weights   04/20/20 2122  Weight: 50.6 kg    Examination:  General exam: Appears lethargic  Respiratory system: diminished breath sounds b/l Cardiovascular system: S1 & S2 +. No rubs, gallops or clicks.  Gastrointestinal system: Abdomen is nondistended, soft and nontender. Hypoactive bowel sounds heard. Central nervous system: Lethargic. Moves all 4 extremities.  Psychiatry: Judgement and insight appear abnormal.  Skin: ecchymosis of left hip, head & upper body     Data Reviewed: I have personally reviewed following labs and imaging studies  CBC: Recent Labs  Lab 04/20/20 2301 04/22/20 0909 04/24/20 0427 04/25/20 0441  WBC 17.4* 13.7* 9.8 7.9  HGB 10.5* 8.7* 7.9* 7.6*  HCT 30.1* 25.3* 23.5* 22.6*  MCV 86.5 87.2 88.7 90.0  PLT 248  203 198 154   Basic Metabolic Panel: Recent Labs  Lab 04/20/20 2301 04/22/20 0909 04/24/20 0427 04/25/20 0441  NA 140 139 140 139  K 4.7 4.1 4.5 4.3  CL 103 102 101 101  CO2 25 26 29 29   GLUCOSE 228* 128* 217* 221*  BUN 29* 44* 58* 63*  CREATININE 0.97 1.36* 1.28* 1.34*  CALCIUM 9.4 9.4 9.9 10.0  MG  --   --   --  2.4   GFR: Estimated Creatinine Clearance: 22.4 mL/min (A) (by C-G formula based on SCr of 1.34 mg/dL (H)). Liver Function Tests: Recent Labs  Lab 04/20/20 2301  AST 30  ALT 18  ALKPHOS 39  BILITOT 1.0  PROT 6.1*  ALBUMIN 3.7   No results for input(s): LIPASE, AMYLASE in the last 168 hours. No results  for input(s): AMMONIA in the last 168 hours. Coagulation Profile: No results for input(s): INR, PROTIME in the last 168 hours. Cardiac Enzymes: Recent Labs  Lab 04/20/20 2301  CKTOTAL 85   BNP (last 3 results) No results for input(s): PROBNP in the last 8760 hours. HbA1C: No results for input(s): HGBA1C in the last 72 hours. CBG: Recent Labs  Lab 04/24/20 0012 04/24/20 0801 04/24/20 1231 04/24/20 1616 04/24/20 2117  GLUCAP 183* 164* 126* 95 214*   Lipid Profile: No results for input(s): CHOL, HDL, LDLCALC, TRIG, CHOLHDL, LDLDIRECT in the last 72 hours. Thyroid Function Tests: Recent Labs    04/25/20 0441  TSH 1.223   Anemia Panel: No results for input(s): VITAMINB12, FOLATE, FERRITIN, TIBC, IRON, RETICCTPCT in the last 72 hours. Sepsis Labs: No results for input(s): PROCALCITON, LATICACIDVEN in the last 168 hours.  Recent Results (from the past 240 hour(s))  SARS Coronavirus 2 by RT PCR (hospital order, performed in [facility] hospital lab) Nasopharyngeal Nasopharyngeal Swab     Status: None   Collection Time: 04/21/20  7:43 AM   Specimen: Nasopharyngeal Swab  Result Value Ref Range Status   SARS Coronavirus 2 NEGATIVE NEGATIVE Final    Comment: (NOTE) SARS-CoV-2 target nucleic acids are NOT DETECTED. The SARS-CoV-2 RNA is generally detectable in upper and lower respiratory specimens during the acute phase of infection. The lowest concentration of SARS-CoV-2 viral copies this assay can detect is 250 copies / mL. A negative result does not preclude SARS-CoV-2 infection and should not be used as the sole basis for treatment or other patient management decisions.  A negative result may occur with improper specimen collection / handling, submission of specimen other than nasopharyngeal swab, presence of viral mutation(s) within the areas targeted by this assay, and inadequate number of viral copies (<250 copies / mL). A negative result must be combined with  clinical observations, patient history, and epidemiological information. Fact Sheet for Patients:   StrictlyIdeas.no Fact Sheet for Healthcare Providers: BankingDealers.co.za This test is not yet approved or cleared  by the [place] FDA and has been authorized for detection and/or diagnosis of SARS-CoV-2 by FDA under an Emergency Use Authorization (EUA).  This EUA will remain in effect (meaning this test can be used) for the duration of the COVID-19 declaration under Section 564(b)(1) of the Act, 21 U.S.C. section 360bbb-3(b)(1), unless the authorization is terminated or revoked sooner. Performed at [facility], [address], [place], [place] [address]          Radiology Studies: CT PELVIS WO CONTRAST  Result Date: 04/24/2020 CLINICAL DATA:  86 year old female with fall and hip pain. EXAM: CT PELVIS WITHOUT CONTRAST  TECHNIQUE: Multidetector CT imaging of the pelvis was performed following the standard protocol without intravenous contrast. COMPARISON:  Hip radiograph dated 04/21/2020. FINDINGS: Urinary Tract:  No abnormality visualized. Bowel:  There is sigmoid diverticulosis. Vascular/Lymphatic: Advanced atherosclerotic calcification of the aorta and iliac arteries. No adenopathy within the pelvis. Reproductive:  The uterus and ovaries are grossly unremarkable. Other: Right hip soft tissue contusion and partially visualized hematoma. A 3 cm cystic structure in the soft tissues of the right groin is not well characterized but may represent a sebaceous cyst. This can be better evaluated with ultrasound. Musculoskeletal: Osteopenia. Degenerative changes of the lower lumbar spine. No acute osseous pathology. IMPRESSION: 1. No acute fracture or dislocation. 2. Right hip soft tissue contusion and partially visualized hematoma. 3. Sigmoid diverticulosis. 4. Aortic Atherosclerosis (ICD10-I70.0). Electronically Signed   By: Elgie Collard M.D.   On: 04/24/2020 16:13        Scheduled Meds: . ALPRAZolam  0.25 mg Oral TID  . bisoprolol-hydrochlorothiazide  1 tablet Oral Daily  . cholecalciferol  1,000 Units Oral Daily  . DULoxetine  20 mg Oral Daily  . feeding supplement (ENSURE ENLIVE)  237 mL Oral TID BM  . haloperidol  0.25 mg Oral BID  . insulin aspart  0-15 Units Subcutaneous TID WC  . omega-3 acid ethyl esters  1 g Oral Daily  . senna-docusate  1 tablet Oral BID  . sodium chloride flush  3 mL Intravenous Q12H  . vitamin B-12  1,000 mcg Oral Daily   Continuous Infusions: . sodium chloride       LOS: 4 days    Time spent: 31 mins     Charise Killian, MD [facility] Pager [phone]  If 7PM-7AM, please contact night-coverage [email or web address] 04/25/2020, 7:23 AM

## 2020-04-25 NOTE — TOC Progression Note (Signed)
Transition of Care [facility]) - Progression Note    Patient Details  Name: Sierra Lloyd MRN: [id number] Date of Birth: Mar 25, 1934  [facility]) CM/SW Contact  Allayne Butcher, RN Phone Number: 04/25/2020, 1:47 PM  Clinical Narrative:    This RNCM was able to speak with the patient's niece, Camelia Eng.  [name] reports that patient lives alone in a Mobile Home in [place].  Patient has been very independent and uses a walker.  Camelia Eng explains that patient has anxiety, she has not left her house in a while because she is afraid of COVID.  She has not been vaccinated for COVID.  Camelia Eng is in agreement that the patient needs short term rehab.  She has 3 people that will come over and help her at home but no one is there 24/7.   Bed search has been started for SNF, patient is not medically stable for discharge.  Patient has been very agitated since admission, thought to be from her subdural hematoma.   Psych is following her progress.   Pasrr is pending.    Expected Discharge Plan: Skilled Nursing Facility Barriers to Discharge: Continued Medical Work up  Expected Discharge Plan and Services Expected Discharge Plan: Skilled Nursing Facility   Discharge Planning Services: CM Consult Post Acute Care Choice: Skilled Nursing Facility Living arrangements for the past 2 months: Single Family Home                                       Social Determinants of Health (SDOH) Interventions    Readmission Risk Interventions No flowsheet data found.
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
## 2020-04-25 NOTE — Progress Notes (Signed)
Please be advised that the above-named patient will require a short-term nursing home stay-anticipated 30 days or less for rehabilitation and strengthening. The plan is for return home.  

## 2020-04-25 NOTE — Progress Notes (Signed)
OT Cancellation Note  Patient Details Name: DERICA LEIBER MRN: [id number] DOB: 15-Jan-1934   Cancelled Treatment:    Reason Eval/Treat Not Completed: Fatigue/lethargy limiting ability to participate;Patient's level of consciousness  When OT presents to pt room this date, she does not wake to auditory or tactile stimuli. RN reports that pt has not been sedated this date, but did receive ativan at bedtime last night. Sitter present in room reports that she did not wake to VS or BG being taken this AM. Will f/u for OT evaluation at later date/time as pt becomes more appropriate.  Rejeana Brock, MS, OTR/L ascom [phone] 04/25/20, 9:35 AM

## 2020-04-26 ENCOUNTER — Inpatient Hospital Stay: Payer: Medicare Other

## 2020-04-26 LAB — BASIC METABOLIC PANEL
Anion gap: 7 (ref 5–15)
BUN: 61 mg/dL — ABNORMAL HIGH (ref 8–23)
CO2: 27 mmol/L (ref 22–32)
Calcium: 9.1 mg/dL (ref 8.9–10.3)
Chloride: 106 mmol/L (ref 98–111)
Creatinine, Ser: 1.2 mg/dL — ABNORMAL HIGH (ref 0.44–1.00)
GFR calc Af Amer: 48 mL/min — ABNORMAL LOW (ref >60)
GFR calc non Af Amer: 41 mL/min — ABNORMAL LOW (ref >60)
Glucose, Bld: 134 mg/dL — ABNORMAL HIGH (ref 70–99)
Potassium: 4.1 mmol/L (ref 3.5–5.1)
Sodium: 140 mmol/L (ref 135–145)

## 2020-04-26 LAB — GLUCOSE, CAPILLARY
Glucose-Capillary: 143 mg/dL — ABNORMAL HIGH (ref 70–99)
Glucose-Capillary: 101 mg/dL — ABNORMAL HIGH (ref 70–99)
Glucose-Capillary: 146 mg/dL — ABNORMAL HIGH (ref 70–99)
Glucose-Capillary: 234 mg/dL — ABNORMAL HIGH (ref 70–99)

## 2020-04-26 LAB — CBC
HCT: 22.2 % — ABNORMAL LOW (ref 36.0–46.0)
Hemoglobin: 7.1 g/dL — ABNORMAL LOW (ref 12.0–15.0)
MCH: 29.6 pg (ref 26.0–34.0)
MCHC: 32 g/dL (ref 30.0–36.0)
MCV: 92.5 fL (ref 80.0–100.0)
Platelets: 188 10*3/uL (ref 150–400)
RBC: 2.4 MIL/uL — ABNORMAL LOW (ref 3.87–5.11)
RDW: 13.5 % (ref 11.5–15.5)
WBC: 8.7 10*3/uL (ref 4.0–10.5)
nRBC: 0 % (ref 0.0–0.2)

## 2020-04-26 MED ORDER — LORAZEPAM 2 MG/ML IJ SOLN
1.0000 mg | Freq: Three times a day (TID) | INTRAMUSCULAR | Status: DC | PRN
  Administered 2020-04-26 – 2020-05-04 (×4): 1 mg via INTRAVENOUS
  Filled 2020-04-26 (×5): qty 1

## 2020-04-26 MED ORDER — HALOPERIDOL 0.5 MG PO TABS
0.2500 mg | ORAL_TABLET | Freq: Three times a day (TID) | ORAL | Status: DC
  Administered 2020-04-26 – 2020-05-05 (×21): 0.25 mg via ORAL
  Filled 2020-04-26 (×29): qty 0.5

## 2020-04-26 MED ORDER — GABAPENTIN 300 MG PO CAPS
300.0000 mg | ORAL_CAPSULE | Freq: Every day | ORAL | Status: DC
  Administered 2020-04-26 – 2020-05-08 (×11): 300 mg via ORAL
  Filled 2020-04-26 (×12): qty 1

## 2020-04-26 NOTE — Progress Notes (Signed)
Attempted to give morning medications. Pt. Did take one bit of medication with applesauce which had xanax and haldol. Attempted to give the rest of her medications in applesauce and patient spit it out. Patient continues to scream out "help help help, get me out! Don't do this to me!" Attempting conversation but patient will not engage.

## 2020-04-26 NOTE — Progress Notes (Addendum)
PROGRESS NOTE    Sierra Lloyd  ZOX:[id number] DOB: August 07, 1934 DOA: 04/20/2020 PCP: Margaretann Loveless, PA-C       Assessment & Plan:   Principal Problem:   Subdural hemorrhage [facility]) Active Problems:   Diabetes (HCC)   Anxiety, generalized   Fall at home, initial encounter   Fall   Right frontal lobe subdural hemorrhage: w/ right scalp soft tissue hematoma. S/p fall.  Repeat CT head (04/22/2020) did not show any worsening of subdural hemorrhage. Avoid antiplatelets or anticoagulants.  No surgical intervention per neurosurgeon.  Altered mental status/delirium: possible secondary to subdural hemorrhage. Supportive care. Continue w/ IVC as per psych. Psychiatry following and recs apprec   Generalized anxiety/PTSD: continue on xanax, cymbalta & haldol as per psych   Ecchymosis: of left hip, head & upper body. Secondary to fall at home. CT pelvis WO shows no fracture or dislocation, hematoma. Continue w/ supportive care   Normocytic anemia: w/ component of acute blood loss anemia. Possibly secondary to left hip hematoma after a fall at home. Will transfuse if Hb <7.   AKI: Cr is labile. Continue on IVFs. Will continue to monitor   Leukocytosis: resolved  Hypertension: continue on bisoprolol-HCTZ  Anxiety: severity unknown. Xanax prn    DVT prophylaxis: SCDs Code Status: full  Family Communication: discussed pt's care w/ pt's niece, Camelia Eng, and answered her questions  Disposition Plan: likely d/c to SNF. Unlikely any barriers  Status is: Inpatient  Remains inpatient appropriate because:Ongoing diagnostic testing needed not appropriate for outpatient work up   Dispo: The patient is from: Home              Anticipated d/c is to: SNF              Anticipated d/c date is:2 days              Patient currently is medically stable to d/c.   Consultants:  Neurosurgery   Procedures:   Antimicrobials:    Subjective: Pt is very agitated today.    Objective: Vitals:   04/25/20 1653 04/25/20 2249 04/25/20 2250 04/26/20 0709  BP:  (!) 158/77  (!) 135/99  Pulse:  76  76  Resp:  11 13 16   Temp: 98.5 F (36.9 C) 99.2 F (37.3 C)  98.3 F (36.8 C)  TempSrc: Axillary Axillary  Oral  SpO2:  97%  98%  Weight:      Height:        Intake/Output Summary (Last 24 hours) at 04/26/2020 0736 Last data filed at 04/25/2020 1700 Gross per 24 hour  Intake 470.99 ml  Output 100 ml  Net 370.99 ml   Filed Weights   04/20/20 2122  Weight: 50.6 kg    Examination:  General exam: Appears agitated  Respiratory system: decreased breath sounds b/l. No wheezes, rales Cardiovascular system: S1 & S2 +. No rubs, gallops or clicks.  Gastrointestinal system: Abdomen is nondistended, soft and nontender. Normal bowel sounds heard. Central nervous system: Awake. Moves all 4 extremities.  Psychiatry: Judgement and insight appear abnormal. Agitated & frustration Skin: ecchymosis of left hip, head & upper body     Data Reviewed: I have personally reviewed following labs and imaging studies  CBC: Recent Labs  Lab 04/20/20 2301 04/22/20 0909 04/24/20 0427 04/25/20 0441 04/26/20 0403  WBC 17.4* 13.7* 9.8 7.9 8.7  HGB 10.5* 8.7* 7.9* 7.6* 7.1*  HCT 30.1* 25.3* 23.5* 22.6* 22.2*  MCV 86.5 87.2 88.7 90.0 92.5  PLT 248 203  198 209 188   Basic Metabolic Panel: Recent Labs  Lab 04/20/20 2301 04/22/20 0909 04/24/20 0427 04/25/20 0441 04/26/20 0403  NA 140 139 140 139 140  K 4.7 4.1 4.5 4.3 4.1  CL 103 102 101 101 106  CO2 25 26 29 29 27   GLUCOSE 228* 128* 217* 221* 134*  BUN 29* 44* 58* 63* 61*  CREATININE 0.97 1.36* 1.28* 1.34* 1.20*  CALCIUM 9.4 9.4 9.9 10.0 9.1  MG  --   --   --  2.4  --    GFR: Estimated Creatinine Clearance: 25.1 mL/min (A) (by C-G formula based on SCr of 1.2 mg/dL (H)). Liver Function Tests: Recent Labs  Lab 04/20/20 2301  AST 30  ALT 18  ALKPHOS 39  BILITOT 1.0  PROT 6.1*  ALBUMIN 3.7   No results for  input(s): LIPASE, AMYLASE in the last 168 hours. No results for input(s): AMMONIA in the last 168 hours. Coagulation Profile: No results for input(s): INR, PROTIME in the last 168 hours. Cardiac Enzymes: Recent Labs  Lab 04/20/20 2301  CKTOTAL 85   BNP (last 3 results) No results for input(s): PROBNP in the last 8760 hours. HbA1C: No results for input(s): HGBA1C in the last 72 hours. CBG: Recent Labs  Lab 04/25/20 0752 04/25/20 1422 04/25/20 1648 04/25/20 2242 04/26/20 0706  GLUCAP 198* 209* 161* 135* 143*   Lipid Profile: No results for input(s): CHOL, HDL, LDLCALC, TRIG, CHOLHDL, LDLDIRECT in the last 72 hours. Thyroid Function Tests: Recent Labs    04/25/20 0441  TSH 1.223   Anemia Panel: No results for input(s): VITAMINB12, FOLATE, FERRITIN, TIBC, IRON, RETICCTPCT in the last 72 hours. Sepsis Labs: No results for input(s): PROCALCITON, LATICACIDVEN in the last 168 hours.  Recent Results (from the past 240 hour(s))  SARS Coronavirus 2 by RT PCR (hospital order, performed in [facility] hospital lab) Nasopharyngeal Nasopharyngeal Swab     Status: None   Collection Time: 04/21/20  7:43 AM   Specimen: Nasopharyngeal Swab  Result Value Ref Range Status   SARS Coronavirus 2 NEGATIVE NEGATIVE Final    Comment: (NOTE) SARS-CoV-2 target nucleic acids are NOT DETECTED. The SARS-CoV-2 RNA is generally detectable in upper and lower respiratory specimens during the acute phase of infection. The lowest concentration of SARS-CoV-2 viral copies this assay can detect is 250 copies / mL. A negative result does not preclude SARS-CoV-2 infection and should not be used as the sole basis for treatment or other patient management decisions.  A negative result may occur with improper specimen collection / handling, submission of specimen other than nasopharyngeal swab, presence of viral mutation(s) within the areas targeted by this assay, and inadequate number of viral copies (<250  copies / mL). A negative result must be combined with clinical observations, patient history, and epidemiological information. Fact Sheet for Patients:   04/23/20 Fact Sheet for Healthcare Providers: BoilerBrush.com.cy This test is not yet approved or cleared  by the https://pope.com/ FDA and has been authorized for detection and/or diagnosis of SARS-CoV-2 by FDA under an Emergency Use Authorization (EUA).  This EUA will remain in effect (meaning this test can be used) for the duration of the COVID-19 declaration under Section 564(b)(1) of the Act, 21 U.S.C. section 360bbb-3(b)(1), unless the authorization is terminated or revoked sooner. Performed at [facility], [address], [place], [place]          Radiology Studies: CT PELVIS WO CONTRAST  Result Date: 04/24/2020 CLINICAL DATA:  86 year old female with fall and hip pain. EXAM: CT PELVIS WITHOUT CONTRAST TECHNIQUE: Multidetector CT imaging of the pelvis was performed following the standard protocol without intravenous contrast. COMPARISON:  Hip radiograph dated 04/21/2020. FINDINGS: Urinary Tract:  No abnormality visualized. Bowel:  There is sigmoid diverticulosis. Vascular/Lymphatic: Advanced atherosclerotic calcification of the aorta and iliac arteries. No adenopathy within the pelvis. Reproductive:  The uterus and ovaries are grossly unremarkable. Other: Right hip soft tissue contusion and partially visualized hematoma. A 3 cm cystic structure in the soft tissues of the right groin is not well characterized but may represent a sebaceous cyst. This can be better evaluated with ultrasound. Musculoskeletal: Osteopenia. Degenerative changes of the lower lumbar spine. No acute osseous pathology. IMPRESSION: 1. No acute fracture or dislocation. 2. Right hip soft tissue contusion and partially visualized hematoma. 3. Sigmoid diverticulosis. 4. Aortic Atherosclerosis  (ICD10-I70.0). Electronically Signed   By: Anner Crete M.D.   On: 04/24/2020 16:13        Scheduled Meds: . ALPRAZolam  0.25 mg Oral TID  . bisoprolol-hydrochlorothiazide  1 tablet Oral Daily  . cholecalciferol  1,000 Units Oral Daily  . DULoxetine  20 mg Oral Daily  . feeding supplement (ENSURE ENLIVE)  237 mL Oral TID BM  . haloperidol  0.25 mg Oral BID  . insulin aspart  0-15 Units Subcutaneous TID WC  . omega-3 acid ethyl esters  1 g Oral Daily  . senna-docusate  1 tablet Oral BID  . sodium chloride flush  3 mL Intravenous Q12H  . vitamin B-12  1,000 mcg Oral Daily   Continuous Infusions: . sodium chloride    . sodium chloride 75 mL/hr at 04/26/20 0429     LOS: 5 days    Time spent: 30 mins     Wyvonnia Dusky, MD [facility] Pager [phone]  If 7PM-7AM, please contact night-coverage [email or web address] 04/26/2020, 7:36 AM

## 2020-04-26 NOTE — Consult Note (Signed)
Writer attempted to assess patient however she was recently sedated. Patient continued to scream out at intermittently while in the room. She did arouse to physical stimuli and her name being called.  We are still unable to complete capacity at this time. Will continue with psychiatric medications and will reassess on Monday.

## 2020-04-26 NOTE — Progress Notes (Addendum)
OT Cancellation Note  Patient Details Name: Sierra Lloyd MRN: [id number] DOB: Sep 22, 1934   Cancelled Treatment:    Reason Eval/Treat Not Completed: Fatigue/lethargy limiting ability to participate;Patient's level of consciousness  Upon observation of patient and speaking with RN, pt continues to fluctuate between agitation requiring medication and then a more sedated state. Pt with inability/very limited ability to meaningfully follow any commands at this time. This makes 4th attempt for OT to work with patient. Will complete order at this time. Please re-consult should pt become more appropriate. Thank you.  Rejeana Brock, MS, OTR/L ascom [phone] 04/26/20, 9:40 AM

## 2020-04-26 NOTE — Progress Notes (Signed)
Patient ID: Sierra Lloyd, female   DOB: 08-24-1934, 86 y.o.   MRN: [id number] Rama Candise Bowens MD  Psychiatry Note Brief Consult Note Weekend  04/26/20  I saw patient today and briefly yesterday Spent time with niece day before yesterday  Her MS is still of issue, --mostly in eyes closed state, not making sense, complaining of pain.  Can be screaming for long periods of time. Subdural has affected her frontal lobe so various behaviors ar understandable   She is on haldol 0.25 tid along with Xanax 0.25 tid.    Held if sedated.   IVC expires Monday which I will renew.  When stable and goes to SNF this can be rescinded then but now now  Currently she has eyes closed moaning and groaning.  She only opens eyes then goes back into clouded fluctuant state Answers to name lying flat with complaints of pain in right hip and left arm  At times does not make sense   MS limited due to current state  US of right pelvis shows hematoma vs cyst CT on 6/1 shows unchanged right frontal lobe hematoma   Will keep following and redo IVC on Monday

## 2020-04-27 LAB — BASIC METABOLIC PANEL
Anion gap: 8 (ref 5–15)
BUN: 41 mg/dL — ABNORMAL HIGH (ref 8–23)
CO2: 25 mmol/L (ref 22–32)
Calcium: 9.1 mg/dL (ref 8.9–10.3)
Chloride: 109 mmol/L (ref 98–111)
Creatinine, Ser: 0.91 mg/dL (ref 0.44–1.00)
GFR calc Af Amer: >60 mL/min (ref >60)
GFR calc non Af Amer: 58 mL/min — ABNORMAL LOW (ref >60)
Glucose, Bld: 177 mg/dL — ABNORMAL HIGH (ref 70–99)
Potassium: 4.1 mmol/L (ref 3.5–5.1)
Sodium: 142 mmol/L (ref 135–145)

## 2020-04-27 LAB — CBC
HCT: 26 % — ABNORMAL LOW (ref 36.0–46.0)
Hemoglobin: 8.2 g/dL — ABNORMAL LOW (ref 12.0–15.0)
MCH: 29.9 pg (ref 26.0–34.0)
MCHC: 31.5 g/dL (ref 30.0–36.0)
MCV: 94.9 fL (ref 80.0–100.0)
Platelets: 230 10*3/uL (ref 150–400)
RBC: 2.74 MIL/uL — ABNORMAL LOW (ref 3.87–5.11)
RDW: 13.8 % (ref 11.5–15.5)
WBC: 9.7 10*3/uL (ref 4.0–10.5)
nRBC: 0 % (ref 0.0–0.2)

## 2020-04-27 LAB — GLUCOSE, CAPILLARY
Glucose-Capillary: 138 mg/dL — ABNORMAL HIGH (ref 70–99)
Glucose-Capillary: 157 mg/dL — ABNORMAL HIGH (ref 70–99)
Glucose-Capillary: 173 mg/dL — ABNORMAL HIGH (ref 70–99)

## 2020-04-27 NOTE — Progress Notes (Signed)
PT Cancellation Note  Patient Details Name: Sierra Lloyd MRN: [id number] DOB: 08-20-1934   Cancelled Treatment:    Reason Eval/Treat Not Completed: Fatigue/lethargy limiting ability to participate;Patient's level of consciousness(Treatment attempted, pt remains somnolent, disoriented, easily aggitated. Pt does follow a few simple commands, but responds typically, simply by screaming out. As patient has consistently been altered and unable to participate in therapy for >5 days now, will sign off at this time. Please re consult our services once patient is better able to participate in PT interventions.)  9:04 AM, 04/27/20 Rosamaria Lints, PT, DPT Physical Therapist - [facility]  [phone] (ASCOM)    Shavonta Gossen C 04/27/2020, 9:03 AM

## 2020-04-27 NOTE — Progress Notes (Addendum)
PROGRESS NOTE    Sierra Lloyd  CMK:[id number] DOB: 02-Jun-1934 DOA: 04/20/2020 PCP: Margaretann Loveless, PA-C       Assessment & Plan:   Principal Problem:   Subdural hemorrhage [facility]) Active Problems:   Diabetes (HCC)   Anxiety, generalized   Fall at home, initial encounter   Fall   Right frontal lobe subdural hemorrhage: w/ right scalp soft tissue hematoma. S/p fall.  Repeat CT head (04/22/2020) did not show any worsening of subdural hemorrhage. Avoid antiplatelets or anticoagulants.  No surgical intervention per neurosurgeon.   Altered mental status/delirium: possible secondary to subdural hemorrhage. Supportive care. Continue w/ IVC as per psych. Psychiatry following and recs apprec   Generalized anxiety/PTSD: continue on xanax, cymbalta & haldol as per psych   Ecchymosis: of left hip, head & upper body. Secondary to fall at home. CT pelvis WO shows no fracture or dislocation, hematoma. 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 Continue w/ supportive care. Recommend outpatient f/u w/ GYN  Normocytic anemia: w/ component of acute blood loss anemia. Possibly secondary to left hip hematoma after a fall at home. H&H are trending up today slightly. Will transfuse if Hb <7.   AKI: resolved  Leukocytosis: resolved  Hypertension: continue on bisoprolol-HCTZ. Hydralazine prn  Anxiety: severity unknown. Xanax prn    DVT prophylaxis: SCDs Code Status: full  Family Communication: called pt's niece, Camelia Eng, but no answer so I left a message Disposition Plan: likely d/c to SNF. Unlikely any barriers  Status is: Inpatient  Remains inpatient appropriate because:Ongoing diagnostic testing needed not appropriate for outpatient work up   Dispo: The patient is from: Home              Anticipated d/c is to: SNF              Anticipated d/c date is:3 days              Patient currently is medically stable to d/c.   Consultants:    Neurosurgery   Procedures:   Antimicrobials:    Subjective: Pt is still very agitated.   Objective: Vitals:   04/26/20 1724 04/26/20 2345 04/27/20 0541 04/27/20 0733  BP: (!) 180/103 (!) 156/81 (!) 109/56 (!) 177/75  Pulse: 73 67 (!) 56 68  Resp: 16 15 16 11   Temp: 98 F (36.7 C) 98.2 F (36.8 C) 98.3 F (36.8 C) 98.2 F (36.8 C)  TempSrc: Oral Oral Oral Oral  SpO2: 99% 99% 97% 99%  Weight:      Height:        Intake/Output Summary (Last 24 hours) at 04/27/2020 1113 Last data filed at 04/26/2020 2111 Gross per 24 hour  Intake 100 ml  Output 101 ml  Net -1 ml   Filed Weights   04/20/20 2122  Weight: 50.6 kg    Examination:  General exam: Appears agitated  Respiratory system: diminished breath sounds b/l. No rhonchi Cardiovascular system: S1 & S2 +. No rubs, gallops or clicks.  Gastrointestinal system: Abdomen is nondistended, soft and nontender. Normal bowel sounds heard. Central nervous system: Awake. Moves all 4 extremities.  Psychiatry: Judgement and insight appear abnormal. Agitated & frustrated Skin: ecchymosis of left hip, head & upper body     Data Reviewed: I have personally reviewed following labs and imaging studies  CBC: Recent Labs  Lab 04/22/20 0909 04/24/20 0427 04/25/20 0441 04/26/20 0403 04/27/20 0654  WBC 13.7* 9.8 7.9 8.7 9.7  HGB 8.7* 7.9* 7.6* 7.1* 8.2*  HCT 25.3* 23.5* 22.6* 22.2* 26.0*  MCV 87.2 88.7 90.0 92.5 94.9  PLT 203 198 209 188 063   Basic Metabolic Panel: Recent Labs  Lab 04/22/20 0909 04/24/20 0427 04/25/20 0441 04/26/20 0403 04/27/20 0654  NA 139 140 139 140 142  K 4.1 4.5 4.3 4.1 4.1  CL 102 101 101 106 109  CO2 26 29 29 27 25   GLUCOSE 128* 217* 221* 134* 177*  BUN 44* 58* 63* 61* 41*  CREATININE 1.36* 1.28* 1.34* 1.20* 0.91  CALCIUM 9.4 9.9 10.0 9.1 9.1  MG  --   --  2.4  --   --    GFR: Estimated Creatinine Clearance: 33.1 mL/min (by C-G formula based on SCr of 0.91 mg/dL). Liver Function  Tests: Recent Labs  Lab 04/20/20 2301  AST 30  ALT 18  ALKPHOS 39  BILITOT 1.0  PROT 6.1*  ALBUMIN 3.7   No results for input(s): LIPASE, AMYLASE in the last 168 hours. No results for input(s): AMMONIA in the last 168 hours. Coagulation Profile: No results for input(s): INR, PROTIME in the last 168 hours. Cardiac Enzymes: Recent Labs  Lab 04/20/20 2301  CKTOTAL 85   BNP (last 3 results) No results for input(s): PROBNP in the last 8760 hours. HbA1C: No results for input(s): HGBA1C in the last 72 hours. CBG: Recent Labs  Lab 04/26/20 0706 04/26/20 1145 04/26/20 1717 04/26/20 2201 04/27/20 0735  GLUCAP 143* 101* 146* 234* 157*   Lipid Profile: No results for input(s): CHOL, HDL, LDLCALC, TRIG, CHOLHDL, LDLDIRECT in the last 72 hours. Thyroid Function Tests: Recent Labs    04/25/20 0441  TSH 1.223   Anemia Panel: No results for input(s): VITAMINB12, FOLATE, FERRITIN, TIBC, IRON, RETICCTPCT in the last 72 hours. Sepsis Labs: No results for input(s): PROCALCITON, LATICACIDVEN in the last 168 hours.  Recent Results (from the past 240 hour(s))  SARS Coronavirus 2 by RT PCR (hospital order, performed in [facility] hospital lab) Nasopharyngeal Nasopharyngeal Swab     Status: None   Collection Time: 04/21/20  7:43 AM   Specimen: Nasopharyngeal Swab  Result Value Ref Range Status   SARS Coronavirus 2 NEGATIVE NEGATIVE Final    Comment: (NOTE) SARS-CoV-2 target nucleic acids are NOT DETECTED. The SARS-CoV-2 RNA is generally detectable in upper and lower respiratory specimens during the acute phase of infection. The lowest concentration of SARS-CoV-2 viral copies this assay can detect is 250 copies / mL. A negative result does not preclude SARS-CoV-2 infection and should not be used as the sole basis for treatment or other patient management decisions.  A negative result may occur with improper specimen collection / handling, submission of specimen other than  nasopharyngeal swab, presence of viral mutation(s) within the areas targeted by this assay, and inadequate number of viral copies (<250 copies / mL). A negative result must be combined with clinical observations, patient history, and epidemiological information. Fact Sheet for Patients:   StrictlyIdeas.no Fact Sheet for Healthcare Providers: BankingDealers.co.za This test is not yet approved or cleared  by the [place] FDA and has been authorized for detection and/or diagnosis of SARS-CoV-2 by FDA under an Emergency Use Authorization (EUA).  This EUA will remain in effect (meaning this test can be used) for the duration of the COVID-19 declaration under Section 564(b)(1) of the Act, 21 U.S.C. section 360bbb-3(b)(1), unless the authorization is terminated or revoked sooner. Performed at [facility], [place], [place]  [address]          Radiology Studies: US PELVIS LIMITED (TRANSABDOMINAL ONLY)  Result Date: 04/26/2020 CLINICAL DATA:  Assess hematoma of right labium majus. Patient fell. Hip pain. EXAM: LIMITED ULTRASOUND OF PELVIS TECHNIQUE: Limited transabdominal ultrasound examination of the pelvis was performed. COMPARISON:  CT of the pelvis on 04/24/2020 FINDINGS: A circumscribed oval hypoechoic heterogeneous mass is identified, labeled "RIGHT pelvic". There is no blood flow within the mass. Mass measures 5.0 x 2.8 x 2.9 centimeters. IMPRESSION: Stable appearance of RIGHT labium majus mass, measuring 5.0 centimeters. Although an organized hematoma could have this appearance, the circumscribed margins and localized process suggest a nontraumatic process. Considerations include epidermal inclusion cyst, sebaceous gland cyst, Bartholin duct cyst, or Skene duct cyst. Electronically Signed   By: Norva Pavlov M.D.   On: 04/26/2020 14:56        Scheduled Meds: . ALPRAZolam  0.25 mg Oral TID  .  bisoprolol-hydrochlorothiazide  1 tablet Oral Daily  . cholecalciferol  1,000 Units Oral Daily  . DULoxetine  20 mg Oral Daily  . feeding supplement (ENSURE ENLIVE)  237 mL Oral TID BM  . gabapentin  300 mg Oral QHS  . haloperidol  0.25 mg Oral TID  . insulin aspart  0-15 Units Subcutaneous TID WC  . omega-3 acid ethyl esters  1 g Oral Daily  . senna-docusate  1 tablet Oral BID  . sodium chloride flush  3 mL Intravenous Q12H  . vitamin B-12  1,000 mcg Oral Daily   Continuous Infusions: . sodium chloride    . sodium chloride 75 mL/hr at 04/27/20 0208     LOS: 6 days    Time spent: 31 mins     Charise Killian, MD [facility] Pager [phone]  If 7PM-7AM, please contact night-coverage [email or web address] 04/27/2020, 11:13 AM

## 2020-04-28 LAB — CBC
HCT: 24.3 % — ABNORMAL LOW (ref 36.0–46.0)
Hemoglobin: 8.2 g/dL — ABNORMAL LOW (ref 12.0–15.0)
MCH: 30.9 pg (ref 26.0–34.0)
MCHC: 33.7 g/dL (ref 30.0–36.0)
MCV: 91.7 fL (ref 80.0–100.0)
Platelets: 217 10*3/uL (ref 150–400)
RBC: 2.65 MIL/uL — ABNORMAL LOW (ref 3.87–5.11)
RDW: 13.8 % (ref 11.5–15.5)
WBC: 8.9 10*3/uL (ref 4.0–10.5)
nRBC: 0 % (ref 0.0–0.2)

## 2020-04-28 LAB — BASIC METABOLIC PANEL
Anion gap: 7 (ref 5–15)
BUN: 39 mg/dL — ABNORMAL HIGH (ref 8–23)
CO2: 26 mmol/L (ref 22–32)
Calcium: 8.9 mg/dL (ref 8.9–10.3)
Chloride: 108 mmol/L (ref 98–111)
Creatinine, Ser: 0.88 mg/dL (ref 0.44–1.00)
GFR calc Af Amer: >60 mL/min (ref >60)
GFR calc non Af Amer: >60 mL/min (ref >60)
Glucose, Bld: 150 mg/dL — ABNORMAL HIGH (ref 70–99)
Potassium: 4.1 mmol/L (ref 3.5–5.1)
Sodium: 141 mmol/L (ref 135–145)

## 2020-04-28 LAB — GLUCOSE, CAPILLARY
Glucose-Capillary: 148 mg/dL — ABNORMAL HIGH (ref 70–99)
Glucose-Capillary: 304 mg/dL — ABNORMAL HIGH (ref 70–99)
Glucose-Capillary: 140 mg/dL — ABNORMAL HIGH (ref 70–99)
Glucose-Capillary: 157 mg/dL — ABNORMAL HIGH (ref 70–99)

## 2020-04-28 MED ORDER — ACYCLOVIR 5 % EX OINT
TOPICAL_OINTMENT | Freq: Every day | CUTANEOUS | Status: AC
  Administered 2020-04-28 – 2020-05-01 (×5): 1 via TOPICAL
  Filled 2020-04-28 (×2): qty 15

## 2020-04-28 NOTE — Progress Notes (Signed)
PROGRESS NOTE    Sierra Lloyd  PPI:[id number] DOB: [birth year] DOA: 04/20/2020 PCP: Mar Daring, PA-C       Assessment & Plan:   Principal Problem:   Subdural hemorrhage [facility]) Active Problems:   Diabetes ([place])   Anxiety, generalized   Fall at home, initial encounter   Fall   Right frontal lobe subdural hemorrhage: w/ right scalp soft tissue hematoma. S/p fall. Repeat CT head (04/22/2020) did not show any worsening of subdural hemorrhage. Avoid antiplatelets or anticoagulants.  No surgical intervention per neurosurgeon.   Altered mental status/delirium: possible secondary to subdural hemorrhage. Supportive care. Continue w/ IVC as per psych. Psychiatry following and recs apprec   Generalized anxiety/PTSD: continue on xanax, cymbalta & haldol as per psych   Ecchymosis: of left hip, head & upper body. Secondary to fall at home. CT pelvis WO shows no fracture or dislocation, hematoma. 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 Continue w/ supportive care. Recommend outpatient f/u w/ GYN  Normocytic anemia: w/ component of acute blood loss anemia. Possibly secondary to left hip hematoma after a fall at home. H&H are trending up today slightly. Will transfuse if Hb <7.   AKI: resolved  Leukocytosis: resolved  Hypertension: continue on bisoprolol-HCTZ. Hydralazine prn  Anxiety: severity unknown. Xanax prn    DVT prophylaxis: SCDs Code Status: full  Family Communication:  Disposition Plan: likely d/c to SNF. Barriers are the IVC and the pt screaming all day long   Status is: Inpatient  Remains inpatient appropriate because:Ongoing diagnostic testing needed not appropriate for outpatient work up; Waiting on SNF placement & IVC to be rescinded    Dispo: The patient is from: Home              Anticipated d/c is to: SNF              Anticipated d/c date is:3 days              Patient currently is medically stable to d/c.  Waiting on SNF placement & IVC              to be rescinded   Consultants:  Neurosurgery   Procedures:   Antimicrobials:    Subjective: Pt is very agitated daily.   Objective: Vitals:   04/27/20 0733 04/27/20 2332 04/28/20 0548 04/28/20 0825  BP: (!) 177/75 (!) 150/69 (!) 169/88 (!) 164/76  Pulse: 68 (!) 53 76 80  Resp: 11 16 (!) 21 12  Temp: 98.2 F (36.8 C) (!) 97.5 F (36.4 C) 97.7 F (36.5 C) 98.5 F (36.9 C)  TempSrc: Oral Oral Oral Oral  SpO2: 99% 97% 95% 98%  Weight:      Height:        Intake/Output Summary (Last 24 hours) at 04/28/2020 1157 Last data filed at 04/28/2020 1051 Gross per 24 hour  Intake 840 ml  Output --  Net 840 ml   Filed Weights   04/20/20 2122  Weight: 50.6 kg    Examination:  General exam: Appears agitated  Respiratory system: decreased breath sounds b/l. No rales Cardiovascular system: S1 & S2 +. No rubs, gallops or clicks.  Gastrointestinal system: Abdomen is nondistended, soft and nontender. Hypoactive bowel sounds heard. Central nervous system: Awake. Moves all 4 extremities.  Psychiatry: Judgement and insight appear abnormal. Agitated & frustrated Skin: ecchymosis of left hip, head & upper body     Data Reviewed: I have personally  reviewed following labs and imaging studies  CBC: Recent Labs  Lab 04/24/20 0427 04/25/20 0441 04/26/20 0403 04/27/20 0654 04/28/20 0704  WBC 9.8 7.9 8.7 9.7 8.9  HGB 7.9* 7.6* 7.1* 8.2* 8.2*  HCT 23.5* 22.6* 22.2* 26.0* 24.3*  MCV 88.7 90.0 92.5 94.9 91.7  PLT 198 209 188 230 217   Basic Metabolic Panel: Recent Labs  Lab 04/24/20 0427 04/25/20 0441 04/26/20 0403 04/27/20 0654 04/28/20 0704  NA 140 139 140 142 141  K 4.5 4.3 4.1 4.1 4.1  CL 101 101 106 109 108  CO2 29 29 27 25 26   GLUCOSE 217* 221* 134* 177* 150*  BUN 58* 63* 61* 41* 39*  CREATININE 1.28* 1.34* 1.20* 0.91 0.88  CALCIUM 9.9 10.0 9.1 9.1 8.9  MG  --  2.4  --   --   --    GFR: Estimated Creatinine  Clearance: 34.2 mL/min (by C-G formula based on SCr of 0.88 mg/dL). Liver Function Tests: No results for input(s): AST, ALT, ALKPHOS, BILITOT, PROT, ALBUMIN in the last 168 hours. No results for input(s): LIPASE, AMYLASE in the last 168 hours. No results for input(s): AMMONIA in the last 168 hours. Coagulation Profile: No results for input(s): INR, PROTIME in the last 168 hours. Cardiac Enzymes: No results for input(s): CKTOTAL, CKMB, CKMBINDEX, TROPONINI in the last 168 hours. BNP (last 3 results) No results for input(s): PROBNP in the last 8760 hours. HbA1C: No results for input(s): HGBA1C in the last 72 hours. CBG: Recent Labs  Lab 04/26/20 2201 04/27/20 0735 04/27/20 1146 04/27/20 2341 04/28/20 0808  GLUCAP 234* 157* 138* 173* 148*   Lipid Profile: No results for input(s): CHOL, HDL, LDLCALC, TRIG, CHOLHDL, LDLDIRECT in the last 72 hours. Thyroid Function Tests: No results for input(s): TSH, T4TOTAL, FREET4, T3FREE, THYROIDAB in the last 72 hours. Anemia Panel: No results for input(s): VITAMINB12, FOLATE, FERRITIN, TIBC, IRON, RETICCTPCT in the last 72 hours. Sepsis Labs: No results for input(s): PROCALCITON, LATICACIDVEN in the last 168 hours.  Recent Results (from the past 240 hour(s))  SARS Coronavirus 2 by RT PCR (hospital order, performed in [facility] hospital lab) Nasopharyngeal Nasopharyngeal Swab     Status: None   Collection Time: 04/21/20  7:43 AM   Specimen: Nasopharyngeal Swab  Result Value Ref Range Status   SARS Coronavirus 2 NEGATIVE NEGATIVE Final    Comment: (NOTE) SARS-CoV-2 target nucleic acids are NOT DETECTED. The SARS-CoV-2 RNA is generally detectable in upper and lower respiratory specimens during the acute phase of infection. The lowest concentration of SARS-CoV-2 viral copies this assay can detect is 250 copies / mL. A negative result does not preclude SARS-CoV-2 infection and should not be used as the sole basis for treatment or  other patient management decisions.  A negative result may occur with improper specimen collection / handling, submission of specimen other than nasopharyngeal swab, presence of viral mutation(s) within the areas targeted by this assay, and inadequate number of viral copies (<250 copies / mL). A negative result must be combined with clinical observations, patient history, and epidemiological information. Fact Sheet for Patients:   04/23/20 Fact Sheet for Healthcare Providers: BoilerBrush.com.cy This test is not yet approved or cleared  by the https://pope.com/ FDA and has been authorized for detection and/or diagnosis of SARS-CoV-2 by FDA under an Emergency Use Authorization (EUA).  This EUA will remain in effect (meaning this test can be used) for the duration of the COVID-19 declaration under Section 564(b)(1) of the Act,  21 U.S.C. section 360bbb-3(b)(1), unless the authorization is terminated or revoked sooner. Performed at [facility], [address] [place], [place] [address]          Radiology Studies: No results found.      Scheduled Meds: . ALPRAZolam  0.25 mg Oral TID  . bisoprolol-hydrochlorothiazide  1 tablet Oral Daily  . cholecalciferol  1,000 Units Oral Daily  . DULoxetine  20 mg Oral Daily  . feeding supplement (ENSURE ENLIVE)  237 mL Oral TID BM  . gabapentin  300 mg Oral QHS  . haloperidol  0.25 mg Oral TID  . insulin aspart  0-15 Units Subcutaneous TID WC  . omega-3 acid ethyl esters  1 g Oral Daily  . senna-docusate  1 tablet Oral BID  . sodium chloride flush  3 mL Intravenous Q12H  . vitamin B-12  1,000 mcg Oral Daily   Continuous Infusions: . sodium chloride    . sodium chloride 75 mL/hr at 04/28/20 0055     LOS: 7 days    Time spent: 30 mins     Charise Killian, MD [facility] Pager [phone]  If 7PM-7AM, please contact  night-coverage [email or web address] 04/28/2020, 11:57 AM

## 2020-04-28 NOTE — Care Management Important Message (Signed)
Important Message  Patient Details  Name: MAKAYLEIGH POLIQUIN MRN: [id number] Date of Birth: 1934/05/31   Medicare Important Message Given:  Yes     Olegario Messier A Kacyn Souder 04/28/2020, 11:06 AM
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
## 2020-04-29 LAB — BASIC METABOLIC PANEL
Anion gap: 7 (ref 5–15)
BUN: 35 mg/dL — ABNORMAL HIGH (ref 8–23)
CO2: 26 mmol/L (ref 22–32)
Calcium: 9.2 mg/dL (ref 8.9–10.3)
Chloride: 107 mmol/L (ref 98–111)
Creatinine, Ser: 0.84 mg/dL (ref 0.44–1.00)
GFR calc Af Amer: >60 mL/min (ref >60)
GFR calc non Af Amer: >60 mL/min (ref >60)
Glucose, Bld: 213 mg/dL — ABNORMAL HIGH (ref 70–99)
Potassium: 4 mmol/L (ref 3.5–5.1)
Sodium: 140 mmol/L (ref 135–145)

## 2020-04-29 LAB — CBC
HCT: 25.1 % — ABNORMAL LOW (ref 36.0–46.0)
Hemoglobin: 8.1 g/dL — ABNORMAL LOW (ref 12.0–15.0)
MCH: 30.1 pg (ref 26.0–34.0)
MCHC: 32.3 g/dL (ref 30.0–36.0)
MCV: 93.3 fL (ref 80.0–100.0)
Platelets: 242 10*3/uL (ref 150–400)
RBC: 2.69 MIL/uL — ABNORMAL LOW (ref 3.87–5.11)
RDW: 14.1 % (ref 11.5–15.5)
WBC: 9.7 10*3/uL (ref 4.0–10.5)
nRBC: 0 % (ref 0.0–0.2)

## 2020-04-29 LAB — GLUCOSE, CAPILLARY
Glucose-Capillary: 239 mg/dL — ABNORMAL HIGH (ref 70–99)
Glucose-Capillary: 119 mg/dL — ABNORMAL HIGH (ref 70–99)
Glucose-Capillary: 175 mg/dL — ABNORMAL HIGH (ref 70–99)

## 2020-04-29 NOTE — TOC Progression Note (Signed)
Transition of Care [facility]) - Progression Note    Patient Details  Name: Sierra Lloyd MRN: [id number] Date of Birth: [birth year]  [facility] CM/SW Contact  Allayne Butcher, RN Phone Number: 04/29/2020, 3:35 PM  Clinical Narrative:    RNCM was able to speak with patient's niece, Camelia Eng.  2 facilities so far have offered beds, on is in [place] and the other in [place]  Camelia Eng would like to find something closer.  This RNCM will reach out to local facilities and see if they can reevaluate since patients behaviors are much more appropriate and more interactional.   [facility] and [facility] admissions coordinators will come to interview patient tomorrow.    Expected Discharge Plan: Skilled Nursing Facility Barriers to Discharge: Continued Medical Work up  Expected Discharge Plan and Services Expected Discharge Plan: Skilled Nursing Facility   Discharge Planning Services: CM Consult Post Acute Care Choice: Skilled Nursing Facility Living arrangements for the past 2 months: Single Family Home                                       Social Determinants of Health (SDOH) Interventions    Readmission Risk Interventions No flowsheet data found.

## 2020-04-29 NOTE — Progress Notes (Signed)
PROGRESS NOTE    Sierra Lloyd  DQQ:[id number] DOB: 1934-04-06 DOA: 04/20/2020 PCP: Margaretann Loveless, PA-C   HPI was taken from Dr. Joylene Igo: Sierra Lloyd is a 86 y.o. female with medical history significant for diabetes mellitus, hypertension and anxiety who presented to the ER for evaluation after a fall. Patient refused to come to the ER and her niece had to take out IVC papers to be able to get her evaluated. Patient noted to have a hematoma on her forehead and significant bruising over the right thigh. She moans out in pain with any form of movement especially when she turns in bed. Patient had a CT scan of the head which shows  tiny amount of mixed density subdural hemorrhage overlying the right frontal lobe measuring 4 mm in maximum diameter. No midline shift. Findings consistent with age related atrophy and chronic small vessel ischemia. Soft tissue hematoma overlying the right frontal skull. Transfer to [facility] was discussed with patient's niece who declined because of her onset severe anxiety and opted to have her observed at [facility] and to get a repeat CT scan of the head to evaluate for worsening bleed. Patient had another fall in her room that was unwitnessed, she had a bed alarm that never went off. She had a repeat CT scan which was actually the third one and which was unchanged. Neurosurgeon was consulted in the ER who recommended to repeat CT scan in 24 hours but that no intervention or transfer was needed at this time. Patient is agitated and very anxious and keeps requesting to be discharged. Unable to do a review of systems on this patient due to her mental status Had an extensive discussion with her niece over the phone Sierra Lloyd who is in agreement for patient to be hospitalized and is also in agreement for short-term subacute rehab with goals of getting the patient home.  ED Course: 86 year old Caucasian female who was brought into the  emergency room by her niece who had to take her IVC papers on her to be able to get her to the ER for evaluation. Patient is status post fall and has a tiny subdural hemorrhage. Neurosurgery was consulted and a repeat CT scan of the head was recommended in 24 hours. Patient will be admitted to the hospital for further evaluation    Assessment & Plan:   Principal Problem:   Subdural hemorrhage [facility]) Active Problems:   Diabetes (HCC)   Anxiety, generalized   Fall at home, initial encounter   Fall   Right frontal lobe subdural hemorrhage: w/ right scalp soft tissue hematoma. S/p fall. Repeat CT head (04/22/2020) did not show any worsening of subdural hemorrhage. Avoid antiplatelets or anticoagulants.  No surgical intervention per neurosurgeon.   Altered mental status/delirium: possible secondary to subdural hemorrhage. Improved today. Supportive care. Rescinded IVC 04/28/20 as per psych. Psychiatry following and recs apprec   Generalized anxiety/PTSD: continue on xanax, cymbalta & haldol as per psych   Ecchymosis: of left hip, head & upper body. Secondary to fall at home. CT pelvis WO shows no fracture or dislocation, hematoma. 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 Continue w/ supportive care. Recommend outpatient f/u w/ GYN  Normocytic anemia: w/ component of acute blood loss anemia. Possibly secondary to left hip hematoma after a fall at home. H&H are labile. Will transfuse if Hb <7.   AKI: resolved  Leukocytosis: resolved  Hypertension:  continue on bisoprolol-HCTZ. Hydralazine prn  Anxiety: severity unknown. Xanax prn    DVT prophylaxis: SCDs Code Status: full  Family Communication:  Disposition Plan: likely d/c to SNF. Barriers are the previous IVC and the pt screaming all day long but no screaming this morning when I saw the pt. Stable for d/c to SNF but awaiting placement   Status is: Inpatient  Remains inpatient appropriate  because: unsafe d/c plan. Waiting on SNF placement    Dispo: The patient is from: Home              Anticipated d/c is to: SNF              Anticipated d/c date is: TBD              Patient currently is medically stable to d/c. Waiting on SNF placement   Consultants:  Neurosurgery Psych    Procedures:   Antimicrobials:    Subjective: Pt is calm today and intermittently answers questions appropriately   Objective: Vitals:   04/28/20 1636 04/28/20 2026 04/29/20 0018 04/29/20 0405  BP: (!) 156/68 127/62 (!) 151/87 (!) 166/86  Pulse: (!) 54 63 68 72  Resp:  20 20 18   Temp: (!) 97.5 F (36.4 C) 97.7 F (36.5 C) 98.4 F (36.9 C) 98.1 F (36.7 C)  TempSrc: Oral Oral Oral Oral  SpO2: 99% 98% 98% 97%  Weight:      Height:        Intake/Output Summary (Last 24 hours) at 04/29/2020 0719 Last data filed at 04/29/2020 0500 Gross per 24 hour  Intake 720 ml  Output 425 ml  Net 295 ml   Filed Weights   04/20/20 2122  Weight: 50.6 kg    Examination:  General exam: Appears calm and frail appearing  Respiratory system: diminished breath sounds b/l. No wheezes, rhonchi  Cardiovascular system: S1 & S2 +. No rubs, gallops or clicks.  Gastrointestinal system: Abdomen is nondistended, soft and nontender. Hypoactive bowel sounds heard. Central nervous system: Awake. Moves all 4 extremities.  Psychiatry: Judgement and insight appear abnormal.  Skin: ecchymosis of left hip, head & upper body     Data Reviewed: I have personally reviewed following labs and imaging studies  CBC: Recent Labs  Lab 04/25/20 0441 04/26/20 0403 04/27/20 0654 04/28/20 0704 04/29/20 0407  WBC 7.9 8.7 9.7 8.9 9.7  HGB 7.6* 7.1* 8.2* 8.2* 8.1*  HCT 22.6* 22.2* 26.0* 24.3* 25.1*  MCV 90.0 92.5 94.9 91.7 93.3  PLT 209 188 230 217 734   Basic Metabolic Panel: Recent Labs  Lab 04/25/20 0441 04/26/20 0403 04/27/20 0654 04/28/20 0704 04/29/20 0407  NA 139 140 142 141 140  K 4.3 4.1 4.1 4.1  4.0  CL 101 106 109 108 107  CO2 29 27 25 26 26   GLUCOSE 221* 134* 177* 150* 213*  BUN 63* 61* 41* 39* 35*  CREATININE 1.34* 1.20* 0.91 0.88 0.84  CALCIUM 10.0 9.1 9.1 8.9 9.2  MG 2.4  --   --   --   --    GFR: Estimated Creatinine Clearance: 35.8 mL/min (by C-G formula based on SCr of 0.84 mg/dL). Liver Function Tests: No results for input(s): AST, ALT, ALKPHOS, BILITOT, PROT, ALBUMIN in the last 168 hours. No results for input(s): LIPASE, AMYLASE in the last 168 hours. No results for input(s): AMMONIA in the last 168 hours. Coagulation Profile: No results for input(s): INR, PROTIME in the last 168 hours. Cardiac Enzymes: No results for  input(s): CKTOTAL, CKMB, CKMBINDEX, TROPONINI in the last 168 hours. BNP (last 3 results) No results for input(s): PROBNP in the last 8760 hours. HbA1C: No results for input(s): HGBA1C in the last 72 hours. CBG: Recent Labs  Lab 04/27/20 2341 04/28/20 0808 04/28/20 1209 04/28/20 1602 04/28/20 2142  GLUCAP 173* 148* 304* 140* 157*   Lipid Profile: No results for input(s): CHOL, HDL, LDLCALC, TRIG, CHOLHDL, LDLDIRECT in the last 72 hours. Thyroid Function Tests: No results for input(s): TSH, T4TOTAL, FREET4, T3FREE, THYROIDAB in the last 72 hours. Anemia Panel: No results for input(s): VITAMINB12, FOLATE, FERRITIN, TIBC, IRON, RETICCTPCT in the last 72 hours. Sepsis Labs: No results for input(s): PROCALCITON, LATICACIDVEN in the last 168 hours.  Recent Results (from the past 240 hour(s))  SARS Coronavirus 2 by RT PCR (hospital order, performed in [facility] hospital lab) Nasopharyngeal Nasopharyngeal Swab     Status: None   Collection Time: 04/21/20  7:43 AM   Specimen: Nasopharyngeal Swab  Result Value Ref Range Status   SARS Coronavirus 2 NEGATIVE NEGATIVE Final    Comment: (NOTE) SARS-CoV-2 target nucleic acids are NOT DETECTED. The SARS-CoV-2 RNA is generally detectable in upper and lower respiratory specimens during the acute  phase of infection. The lowest concentration of SARS-CoV-2 viral copies this assay can detect is 250 copies / mL. A negative result does not preclude SARS-CoV-2 infection and should not be used as the sole basis for treatment or other patient management decisions.  A negative result may occur with improper specimen collection / handling, submission of specimen other than nasopharyngeal swab, presence of viral mutation(s) within the areas targeted by this assay, and inadequate number of viral copies (<250 copies / mL). A negative result must be combined with clinical observations, patient history, and epidemiological information. Fact Sheet for Patients:   BoilerBrush.com.cy Fact Sheet for Healthcare Providers: https://pope.com/ This test is not yet approved or cleared  by the [place] FDA and has been authorized for detection and/or diagnosis of SARS-CoV-2 by FDA under an Emergency Use Authorization (EUA).  This EUA will remain in effect (meaning this test can be used) for the duration of the COVID-19 declaration under Section 564(b)(1) of the Act, 21 U.S.C. section 360bbb-3(b)(1), unless the authorization is terminated or revoked sooner. Performed at [facility], [address] [place], [place] [address]          Radiology Studies: No results found.      Scheduled Meds: . acyclovir ointment   Topical 5 X Daily  . ALPRAZolam  0.25 mg Oral TID  . bisoprolol-hydrochlorothiazide  1 tablet Oral Daily  . cholecalciferol  1,000 Units Oral Daily  . DULoxetine  20 mg Oral Daily  . feeding supplement (ENSURE ENLIVE)  237 mL Oral TID BM  . gabapentin  300 mg Oral QHS  . haloperidol  0.25 mg Oral TID  . insulin aspart  0-15 Units Subcutaneous TID WC  . omega-3 acid ethyl esters  1 g Oral Daily  . senna-docusate  1 tablet Oral BID  . sodium chloride flush  3 mL Intravenous Q12H  . vitamin B-12  1,000 mcg Oral Daily     Continuous Infusions: . sodium chloride    . sodium chloride 75 mL/hr at 04/29/20 0305     LOS: 8 days    Time spent: 32 mins     Charise Killian, MD [facility] Pager [phone]  If 7PM-7AM, please contact night-coverage [email or web address] 04/29/2020, 7:19 AM

## 2020-04-30 ENCOUNTER — Encounter: Payer: Self-pay | Admitting: Internal Medicine

## 2020-04-30 LAB — BASIC METABOLIC PANEL
Anion gap: 6 (ref 5–15)
BUN: 32 mg/dL — ABNORMAL HIGH (ref 8–23)
CO2: 27 mmol/L (ref 22–32)
Calcium: 8.9 mg/dL (ref 8.9–10.3)
Chloride: 108 mmol/L (ref 98–111)
Creatinine, Ser: 0.7 mg/dL (ref 0.44–1.00)
GFR calc Af Amer: >60 mL/min (ref >60)
GFR calc non Af Amer: >60 mL/min (ref >60)
Glucose, Bld: 164 mg/dL — ABNORMAL HIGH (ref 70–99)
Potassium: 3.8 mmol/L (ref 3.5–5.1)
Sodium: 141 mmol/L (ref 135–145)

## 2020-04-30 LAB — GLUCOSE, CAPILLARY
Glucose-Capillary: 145 mg/dL — ABNORMAL HIGH (ref 70–99)
Glucose-Capillary: 241 mg/dL — ABNORMAL HIGH (ref 70–99)
Glucose-Capillary: 223 mg/dL — ABNORMAL HIGH (ref 70–99)
Glucose-Capillary: 235 mg/dL — ABNORMAL HIGH (ref 70–99)

## 2020-04-30 LAB — CBC
HCT: 23.5 % — ABNORMAL LOW (ref 36.0–46.0)
Hemoglobin: 7.8 g/dL — ABNORMAL LOW (ref 12.0–15.0)
MCH: 30.5 pg (ref 26.0–34.0)
MCHC: 33.2 g/dL (ref 30.0–36.0)
MCV: 91.8 fL (ref 80.0–100.0)
Platelets: 216 10*3/uL (ref 150–400)
RBC: 2.56 MIL/uL — ABNORMAL LOW (ref 3.87–5.11)
RDW: 14.4 % (ref 11.5–15.5)
WBC: 8.8 10*3/uL (ref 4.0–10.5)
nRBC: 0 % (ref 0.0–0.2)

## 2020-04-30 MED ORDER — SALINE SPRAY 0.65 % NA SOLN
1.0000 | NASAL | Status: DC | PRN
  Filled 2020-04-30: qty 44

## 2020-04-30 MED ORDER — MORPHINE SULFATE (PF) 2 MG/ML IV SOLN
2.0000 mg | Freq: Four times a day (QID) | INTRAVENOUS | Status: DC | PRN
  Administered 2020-05-02 – 2020-05-05 (×5): 2 mg via INTRAVENOUS
  Administered 2020-05-08: 1 mg via INTRAVENOUS
  Filled 2020-04-30 (×8): qty 1

## 2020-04-30 NOTE — Progress Notes (Signed)
Progress Note    Sierra Lloyd  EUM:[id number] DOB: Mar 25, 1934  DOA: 04/20/2020 PCP: Margaretann Loveless, PA-C      Brief Narrative:    Medical records reviewed and are as summarized below:  Sierra Lloyd is a 86 y.o. female  with medical history significant fordiabetes mellitus, hypertension and anxiety who presented to the ER for evaluation after a fall. Patient refused to come to the ER and her niece had to take out IVC papers to be able to get her evaluated. Patient noted to have small right frontal subdural hemorrhage, right forehead  hematoma neurosurgeon consulted in the emergency room recommended conservative management and repeat CT head.  Follow-up repeat CT head did not show any worsening of subdural hemorrhage.       Assessment/Plan:   Principal Problem:   Subdural hemorrhage (HCC) Active Problems:   Diabetes (HCC)   Anxiety, generalized   Fall at home, initial encounter   Fall   S/p fall, small right frontal lobe subdural hemorrhage, right scalp soft tissue hematoma: Repeat CT head today (04/22/2020) did not show any worsening of subdural hemorrhage.  Avoid antiplatelets or anticoagulants.  No surgical intervention per neurosurgeon. Analgesics as needed for pain.  Acute confusional state/altered mental status/delirium: Etiology unclear. Supportive care.   IVC has been rescinded by psychiatrist.  Hypertension: Continue antihypertensives  Right labium majus mass: cyst vs hematoma as per [place]. Continue w/ supportive care. Recommend outpatient f/u w/ GYN  Anxiety, PTSD: Xanax as needed for anxiety  AKI, leukocytosis: Resolved  Body mass index is 21.79 kg/m.   Family Communication/Anticipated D/C date and plan/Code Status   DVT prophylaxis: SCD Code Status: Full code Family Communication: None Disposition Plan:    Status is: Inpatient  Remains inpatient appropriate because:Unsafe d/c plan   Dispo: The patient is from: Home               Anticipated d/c is to: SNF              Anticipated d/c date is: 1 day              Patient currently is not medically stable to d/c. Awaiting placement to SNF.           Subjective:   Patient is confused unable to provide any history.  Objective:    Vitals:   04/29/20 1958 04/30/20 0328 04/30/20 0816 04/30/20 1321  BP: (!) 131/59 (!) 164/95 (!) 158/66 (!) 155/73  Pulse: (!) 59 61 64 62  Resp: 16 16 18 18   Temp: 98 F (36.7 C) 97.8 F (36.6 C) 98.3 F (36.8 C) 97.6 F (36.4 C)  TempSrc: Oral Oral Oral Oral  SpO2: 98% 99% 97% 99%  Weight:      Height:       No data found.   Intake/Output Summary (Last 24 hours) at 04/30/2020 1332 Last data filed at 04/29/2020 1824 Gross per 24 hour  Intake 240 ml  Output 550 ml  Net -310 ml   Filed Weights   04/20/20 2122  Weight: 50.6 kg    Exam:  GEN: NAD SKIN: Swelling on the right forehead, extensive bruising/ecchymosis on the right side of her body including her abdomen, right hip and buttock. EYES: No pallor or icterus ENT: MMM CV: RRR PULM: CTA B ABD: soft, ND, NT, +BS CNS: Drowsy but arousable. Confused. EXT: Swelling of bilateral upper extremities with no swelling noted in lower extremities.   Data Reviewed:  I have personally reviewed following labs and imaging studies:  Labs: Labs show the following:   Basic Metabolic Panel: Recent Labs  Lab 04/25/20 0441 04/25/20 0441 04/26/20 0403 04/26/20 0403 04/27/20 0654 04/27/20 0654 04/28/20 0704 04/28/20 0704 04/29/20 0407 04/30/20 0445  NA 139   < > 140  --  142  --  141  --  140 141  K 4.3   < > 4.1   < > 4.1   < > 4.1   < > 4.0 3.8  CL 101   < > 106  --  109  --  108  --  107 108  CO2 29   < > 27  --  25  --  26  --  26 27  GLUCOSE 221*   < > 134*  --  177*  --  150*  --  213* 164*  BUN 63*   < > 61*  --  41*  --  39*  --  35* 32*  CREATININE 1.34*   < > 1.20*  --  0.91  --  0.88  --  0.84 0.70  CALCIUM 10.0   < > 9.1  --  9.1  --  8.9  --   9.2 8.9  MG 2.4  --   --   --   --   --   --   --   --   --    < > = values in this interval not displayed.   GFR Estimated Creatinine Clearance: 37.6 mL/min (by C-G formula based on SCr of 0.7 mg/dL). Liver Function Tests: No results for input(s): AST, ALT, ALKPHOS, BILITOT, PROT, ALBUMIN in the last 168 hours. No results for input(s): LIPASE, AMYLASE in the last 168 hours. No results for input(s): AMMONIA in the last 168 hours. Coagulation profile No results for input(s): INR, PROTIME in the last 168 hours.  CBC: Recent Labs  Lab 04/26/20 0403 04/27/20 0654 04/28/20 0704 04/29/20 0407 04/30/20 0445  WBC 8.7 9.7 8.9 9.7 8.8  HGB 7.1* 8.2* 8.2* 8.1* 7.8*  HCT 22.2* 26.0* 24.3* 25.1* 23.5*  MCV 92.5 94.9 91.7 93.3 91.8  PLT 188 230 217 242 216   Cardiac Enzymes: No results for input(s): CKTOTAL, CKMB, CKMBINDEX, TROPONINI in the last 168 hours. BNP (last 3 results) No results for input(s): PROBNP in the last 8760 hours. CBG: Recent Labs  Lab 04/29/20 1207 04/29/20 1639 04/29/20 2110 04/30/20 0850 04/30/20 1227  GLUCAP 239* 119* 175* 145* 241*   D-Dimer: No results for input(s): DDIMER in the last 72 hours. Hgb A1c: No results for input(s): HGBA1C in the last 72 hours. Lipid Profile: No results for input(s): CHOL, HDL, LDLCALC, TRIG, CHOLHDL, LDLDIRECT in the last 72 hours. Thyroid function studies: No results for input(s): TSH, T4TOTAL, T3FREE, THYROIDAB in the last 72 hours.  Invalid input(s): FREET3 Anemia work up: No results for input(s): VITAMINB12, FOLATE, FERRITIN, TIBC, IRON, RETICCTPCT in the last 72 hours. Sepsis Labs: Recent Labs  Lab 04/27/20 0654 04/28/20 0704 04/29/20 0407 04/30/20 0445  WBC 9.7 8.9 9.7 8.8    Microbiology Recent Results (from the past 240 hour(s))  SARS Coronavirus 2 by RT PCR (hospital order, performed in [facility] hospital lab) Nasopharyngeal Nasopharyngeal Swab     Status: None   Collection Time: 04/21/20  7:43 AM    Specimen: Nasopharyngeal Swab  Result Value Ref Range Status   SARS Coronavirus 2 NEGATIVE NEGATIVE Final    Comment: (NOTE) SARS-CoV-2 target  nucleic acids are NOT DETECTED. The SARS-CoV-2 RNA is generally detectable in upper and lower respiratory specimens during the acute phase of infection. The lowest concentration of SARS-CoV-2 viral copies this assay can detect is 250 copies / mL. A negative result does not preclude SARS-CoV-2 infection and should not be used as the sole basis for treatment or other patient management decisions.  A negative result may occur with improper specimen collection / handling, submission of specimen other than nasopharyngeal swab, presence of viral mutation(s) within the areas targeted by this assay, and inadequate number of viral copies (<250 copies / mL). A negative result must be combined with clinical observations, patient history, and epidemiological information. Fact Sheet for Patients:   BoilerBrush.com.cy Fact Sheet for Healthcare Providers: https://pope.com/ This test is not yet approved or cleared  by the [place] FDA and has been authorized for detection and/or diagnosis of SARS-CoV-2 by FDA under an Emergency Use Authorization (EUA).  This EUA will remain in effect (meaning this test can be used) for the duration of the COVID-19 declaration under Section 564(b)(1) of the Act, 21 U.S.C. section 360bbb-3(b)(1), unless the authorization is terminated or revoked sooner. Performed at [facility], [address] [place], [place] [address]     Procedures and diagnostic studies:  No results found.  Medications:   . acyclovir ointment   Topical 5 X Daily  . ALPRAZolam  0.25 mg Oral TID  . bisoprolol-hydrochlorothiazide  1 tablet Oral Daily  . cholecalciferol  1,000 Units Oral Daily  . DULoxetine  20 mg Oral Daily  . feeding supplement (ENSURE ENLIVE)  237 mL Oral TID BM  .  gabapentin  300 mg Oral QHS  . haloperidol  0.25 mg Oral TID  . insulin aspart  0-15 Units Subcutaneous TID WC  . omega-3 acid ethyl esters  1 g Oral Daily  . senna-docusate  1 tablet Oral BID  . sodium chloride flush  3 mL Intravenous Q12H  . vitamin B-12  1,000 mcg Oral Daily   Continuous Infusions: . sodium chloride       LOS: 9 days   Jazalynn Mireles  [facility]     04/30/2020, 1:32 PM

## 2020-04-30 NOTE — Evaluation (Signed)
Clinical/Bedside Swallow Evaluation Patient Details  Name: Sierra Lloyd MRN: [id number] Date of Birth: 01/23/1934  Today's Date: 04/30/2020 Time: SLP Start Time (ACUTE ONLY): 1330 SLP Stop Time (ACUTE ONLY): 1425 SLP Time Calculation (min) (ACUTE ONLY): 55 min  Past Medical History:  Past Medical History:  Diagnosis Date  . Allergy   . Anxiety   . Arthritis   . Diabetes mellitus without complication (HCC)   . Hypertension    Past Surgical History:  Past Surgical History:  Procedure Laterality Date  . CATARACT EXTRACTION W/PHACO Left 04/12/2016   Procedure: CATARACT EXTRACTION PHACO AND INTRAOCULAR LENS PLACEMENT (IOC);  Surgeon: Sallee Lange, MD;  Location: [facility] ORS;  Service: Ophthalmology;  Laterality: Left;  [place]  02:06 AP% 26.8 CDE 55.05 fluid pack lot # [id number]   HPI:  Pt is a 86 y.o. female with medical history significant for diabetes mellitus, hypertension and anxiety who presented to the ER for evaluation after a fall. Patient refused to come to the ER and her niece had to take out IVC papers to be able to get her evaluated. Patient noted to have a hematoma on her forehead and significant bruising over the right thigh. Head CT revealed "tiny amount of mixed density subdural hemorrhage overlying the right frontal lobe measuring 4 mm in maximum diameter, No midline shift; age related atrophy and chronic small vessel ischemia; Soft tissue hematoma overlying the right frontal skull".    Assessment / Plan / Recommendation Clinical Impression  Pt appears to present w/ grossly adequate pharyngeal phase swallowing function, and adequate oral phase management of modified food consistencies(Purees), and liquids. Pt is missing many native Dentition for effective/efficient mastication, and exhibits min reduced focus w/ task(NSG reported giving Morphine for pain recently). Pt appears at reduced risk for aspiration w/ modified diet/foods and general aspiration precautions w/ all oral  intake. During this session, pt required full sitting/positioning and feeding support(she did not attempt to feed self). Pt consumed trials of thin liquids and purees w/ no overt, clinical s/s of aspiration; no decline in vocal quality or respiratory status noted during/post po trials. Similar was noted w/ trials of Minced foods in puree, however, pt exhibited increased oral phase time in attempts to masticate the Minced consistency, min disorganized. Pt is missing many Dentition. Given Time and alternating w/ a puree trial, pt was able to adequately swallow/clear mouth of Minced food trials. OM exam revealed no unilateral weakness; speech clear/intelligible though min drowsy. NSG reported pt having difficulty attempting to eat solid foods today - "choking" on the solids, but did "much better" on the pureed diet pt had previously in the week. Full feeding support given. Recommend a Dysphagia level 1 (puree) diet w/ Thin liquids; general aspiration precautions; Pills CRUSHED in Puree for safety. Feeding support and Supervision at meals. When pt is able to attain sufficient Dentition for appropriate mastication of solids, then an upgraded diet consistency could be considered. Pt must be fully awake/alert to take any po's - monitor giving any sedating Pain meds BEFORE meals.  SLP Visit Diagnosis: Dysphagia, oral phase (R13.11)(missing many Dentition)    Aspiration Risk  Mild aspiration risk;Risk for inadequate nutrition/hydration    Diet Recommendation  Dysphagia level 1 (puree) diet w/ gravies, Thin liquids. General aspiration precautions; feeding support and Supervision at meals as needed.   Medication Administration: Crushed with puree(for safer swallowing)    Other  Recommendations Recommended Consults: (Dietician f/u) Oral Care Recommendations: Oral care BID;Oral care before and after PO;Staff/trained caregiver to  provide oral care Other Recommendations: (n/a)   Follow up Recommendations None       Frequency and Duration (n/a)  (n/a)       Prognosis Prognosis for Safe Diet Advancement: Fair(-Good) Barriers to Reach Goals: Behavior      Swallow Study   General Date of Onset: 04/21/20 HPI: Pt is a 86 y.o. female with medical history significant for diabetes mellitus, hypertension and anxiety who presented to the ER for evaluation after a fall. Patient refused to come to the ER and her niece had to take out IVC papers to be able to get her evaluated. Patient noted to have a hematoma on her forehead and significant bruising over the right thigh. Head CT revealed "tiny amount of mixed density subdural hemorrhage overlying the right frontal lobe measuring 4 mm in maximum diameter, No midline shift; age related atrophy and chronic small vessel ischemia; Soft tissue hematoma overlying the right frontal skull".  Type of Study: Bedside Swallow Evaluation Previous Swallow Assessment: none reported Diet Prior to this Study: Regular;Thin liquids Temperature Spikes Noted: No(wbc 8.8) Respiratory Status: Room air History of Recent Intubation: No Behavior/Cognition: Alert;Cooperative;Pleasant mood;Confused;Distractible;Requires cueing(Slightly Drowsy) Oral Cavity Assessment: Dry(food debris) Oral Care Completed by SLP: Yes Oral Cavity - Dentition: Poor condition;Missing dentition(many) Vision: (n/a) Self-Feeding Abilities: Needs assist;Needs set up;Total assist Patient Positioning: Upright in bed(needed full positioning support) Baseline Vocal Quality: Normal;Low vocal intensity Volitional Cough: Strong Volitional Swallow: Able to elicit    Oral/Motor/Sensory Function Overall Oral Motor/Sensory Function: Within functional limits   Ice Chips Ice chips: Within functional limits Presentation: Spoon(fed; 2 trials)   Thin Liquid Thin Liquid: Within functional limits Presentation: Self Fed;Straw(8 trials) Other Comments: verbal cues given to pt to sit upright; take small sips slowly    Nectar  Thick Nectar Thick Liquid: Not tested   Honey Thick Honey Thick Liquid: Not tested   Puree Puree: Within functional limits Presentation: Spoon(fed; 10 trials)   Solid     Solid: Impaired(minced trials) Presentation: Spoon(fed; 2 trials) Oral Phase Impairments: Impaired mastication;Poor awareness of bolus Oral Phase Functional Implications: Prolonged oral transit;Impaired mastication(missing dentition) Pharyngeal Phase Impairments: (none)       Orinda Kenner, MS, CCC-SLP Ellenor Wisniewski 04/30/2020,2:51 PM

## 2020-04-30 NOTE — TOC Progression Note (Signed)
Transition of Care [facility]) - Progression Note    Patient Details  Name: Sierra Lloyd MRN: [id number] Date of Birth: 12-02-1933  Transition of [facility]) CM/SW Contact  Allayne Butcher, RN Phone Number: 04/30/2020, 1:57 PM  Clinical Narrative:    Tresa Endo with admissions was able to come and evaluate patient at bedside.  [facility] may be able to offer a bed.  Patient is still requiring IV pain medication for left leg pain.  Nurse has notified the MD about pain level.    Expected Discharge Plan: Skilled Nursing Facility Barriers to Discharge: Continued Medical Work up  Expected Discharge Plan and Services Expected Discharge Plan: Skilled Nursing Facility   Discharge Planning Services: CM Consult Post Acute Care Choice: Skilled Nursing Facility Living arrangements for the past 2 months: Single Family Home                                       Social Determinants of Health (SDOH) Interventions    Readmission Risk Interventions No flowsheet data found.

## 2020-05-01 LAB — GLUCOSE, CAPILLARY
Glucose-Capillary: 157 mg/dL — ABNORMAL HIGH (ref 70–99)
Glucose-Capillary: 199 mg/dL — ABNORMAL HIGH (ref 70–99)
Glucose-Capillary: 205 mg/dL — ABNORMAL HIGH (ref 70–99)
Glucose-Capillary: 221 mg/dL — ABNORMAL HIGH (ref 70–99)
Glucose-Capillary: 152 mg/dL — ABNORMAL HIGH (ref 70–99)

## 2020-05-01 NOTE — Care Management Important Message (Signed)
Important Message  Patient Details  Name: Sierra Lloyd MRN: [id number] Date of Birth: 05-16-1934   Medicare Important Message Given:  Yes     [name] 05/01/2020, 1:06 PM

## 2020-05-01 NOTE — Progress Notes (Signed)
Progress Note    Sierra Lloyd  KYH:[id number] DOB: 16-Jun-1934  DOA: 04/20/2020 PCP: Mar Daring, PA-C      Brief Narrative:    Medical records reviewed and are as summarized below:  Sierra Lloyd is a 86 y.o. female  with medical history significant fordiabetes mellitus, hypertension and anxiety who presented to the ER for evaluation after a fall. Patient refused to come to the ER and her niece had to take out IVC papers to be able to get her evaluated. Patient noted to have small right frontal subdural hemorrhage, right forehead  hematoma neurosurgeon consulted in the emergency room recommended conservative management and repeat CT head.  Follow-up repeat CT head did not show any worsening of subdural hemorrhage.       Assessment/Plan:   Principal Problem:   Subdural hemorrhage (HCC) Active Problems:   Diabetes ([place])   Anxiety, generalized   Fall at home, initial encounter   Fall   S/p fall, small right frontal lobe subdural hemorrhage, right scalp soft tissue hematoma: Repeat CT head today (04/22/2020) did not show any worsening of subdural hemorrhage.  Avoid antiplatelets or anticoagulants.  No surgical intervention per neurosurgeon. Analgesics as needed for pain in the left leg.  Acute confusional state/altered mental status/delirium: Etiology unclear. Supportive care.   IVC has been rescinded by psychiatrist.  Hypertension: Continue antihypertensives  Right labium majus mass: cyst vs hematoma as per [place]. Continue w/ supportive care. Recommend outpatient f/u w/ GYN  Anxiety, PTSD: Xanax as needed for anxiety  AKI, leukocytosis: Resolved  Body mass index is 21.79 kg/m.   Family Communication/Anticipated D/C date and plan/Code Status   DVT prophylaxis: SCD Code Status: Full code Family Communication: None Disposition Plan:    Status is: Inpatient  Remains inpatient appropriate because:Unsafe d/c plan   Dispo: The patient is from:  Home              Anticipated d/c is to: SNF              Anticipated d/c date is: 1 day              Patient currently is medically stable to d/c. Awaiting placement to SNF.           Subjective:   She complains of numbness and pain in her left leg. She has had intermittent confusion.  Objective:    Vitals:   04/30/20 2006 05/01/20 0437 05/01/20 0804 05/01/20 1223  BP: (!) 140/53 (!) 132/48 137/84 (!) 133/52  Pulse: 60 (!) 55 70 (!) 57  Resp: 16 16 16 12   Temp: 97.7 F (36.5 C) 97.6 F (36.4 C) 98.6 F (37 C) 97.8 F (36.6 C)  TempSrc: Axillary Axillary Axillary Axillary  SpO2: 99% 98% 98% 97%  Weight:      Height:       No data found.   Intake/Output Summary (Last 24 hours) at 05/01/2020 1338 Last data filed at 05/01/2020 0446 Gross per 24 hour  Intake 480 ml  Output 500 ml  Net -20 ml   Filed Weights   04/20/20 2122  Weight: 50.6 kg    Exam:  GEN: NAD SKIN: Swelling on the right forehead, extensive bruising/ecchymosis on the right side of her body including her abdomen, right hip and buttock.  She also has bruises on her chest EYES: No pallor or icterus ENT: MMM CV: RRR PULM: No wheezing or rales heard ABD: soft, ND, NT, +BS CNS: Drowsy  but arousable. Confused. EXT: Swelling of bilateral upper extremities with no swelling noted in lower extremities.   Data Reviewed:   I have personally reviewed following labs and imaging studies:  Labs: Labs show the following:   Basic Metabolic Panel: Recent Labs  Lab 04/25/20 0441 04/25/20 0441 04/26/20 0403 04/26/20 0403 04/27/20 0654 04/27/20 0654 04/28/20 0704 04/28/20 0704 04/29/20 0407 04/30/20 0445  NA 139   < > 140  --  142  --  141  --  140 141  K 4.3   < > 4.1   < > 4.1   < > 4.1   < > 4.0 3.8  CL 101   < > 106  --  109  --  108  --  107 108  CO2 29   < > 27  --  25  --  26  --  26 27  GLUCOSE 221*   < > 134*  --  177*  --  150*  --  213* 164*  BUN 63*   < > 61*  --  41*  --  39*  --   35* 32*  CREATININE 1.34*   < > 1.20*  --  0.91  --  0.88  --  0.84 0.70  CALCIUM 10.0   < > 9.1  --  9.1  --  8.9  --  9.2 8.9  MG 2.4  --   --   --   --   --   --   --   --   --    < > = values in this interval not displayed.   GFR Estimated Creatinine Clearance: 37.6 mL/min (by C-G formula based on SCr of 0.7 mg/dL). Liver Function Tests: No results for input(s): AST, ALT, ALKPHOS, BILITOT, PROT, ALBUMIN in the last 168 hours. No results for input(s): LIPASE, AMYLASE in the last 168 hours. No results for input(s): AMMONIA in the last 168 hours. Coagulation profile No results for input(s): INR, PROTIME in the last 168 hours.  CBC: Recent Labs  Lab 04/26/20 0403 04/27/20 0654 04/28/20 0704 04/29/20 0407 04/30/20 0445  WBC 8.7 9.7 8.9 9.7 8.8  HGB 7.1* 8.2* 8.2* 8.1* 7.8*  HCT 22.2* 26.0* 24.3* 25.1* 23.5*  MCV 92.5 94.9 91.7 93.3 91.8  PLT 188 230 217 242 216   Cardiac Enzymes: No results for input(s): CKTOTAL, CKMB, CKMBINDEX, TROPONINI in the last 168 hours. BNP (last 3 results) No results for input(s): PROBNP in the last 8760 hours. CBG: Recent Labs  Lab 04/30/20 1227 04/30/20 1705 04/30/20 2140 05/01/20 0731 05/01/20 1150  GLUCAP 241* 223* 235* 157* 199*   D-Dimer: No results for input(s): DDIMER in the last 72 hours. Hgb A1c: No results for input(s): HGBA1C in the last 72 hours. Lipid Profile: No results for input(s): CHOL, HDL, LDLCALC, TRIG, CHOLHDL, LDLDIRECT in the last 72 hours. Thyroid function studies: No results for input(s): TSH, T4TOTAL, T3FREE, THYROIDAB in the last 72 hours.  Invalid input(s): FREET3 Anemia work up: No results for input(s): VITAMINB12, FOLATE, FERRITIN, TIBC, IRON, RETICCTPCT in the last 72 hours. Sepsis Labs: Recent Labs  Lab 04/27/20 0654 04/28/20 0704 04/29/20 0407 04/30/20 0445  WBC 9.7 8.9 9.7 8.8    Microbiology No results found for this or any previous visit (from the past 240 hour(s)).  Procedures and  diagnostic studies:  No results found.  Medications:   . acyclovir ointment   Topical 5 X Daily  . ALPRAZolam  0.25 mg Oral  TID  . bisoprolol-hydrochlorothiazide  1 tablet Oral Daily  . cholecalciferol  1,000 Units Oral Daily  . DULoxetine  20 mg Oral Daily  . feeding supplement (ENSURE ENLIVE)  237 mL Oral TID BM  . gabapentin  300 mg Oral QHS  . haloperidol  0.25 mg Oral TID  . insulin aspart  0-15 Units Subcutaneous TID WC  . senna-docusate  1 tablet Oral BID  . sodium chloride flush  3 mL Intravenous Q12H  . vitamin B-12  1,000 mcg Oral Daily   Continuous Infusions: . sodium chloride       LOS: 10 days   Sabrine Patchen  [facility]     05/01/2020, 1:38 PM

## 2020-05-02 ENCOUNTER — Inpatient Hospital Stay: Payer: Medicare Other

## 2020-05-02 LAB — GLUCOSE, CAPILLARY
Glucose-Capillary: 133 mg/dL — ABNORMAL HIGH (ref 70–99)
Glucose-Capillary: 219 mg/dL — ABNORMAL HIGH (ref 70–99)
Glucose-Capillary: 242 mg/dL — ABNORMAL HIGH (ref 70–99)

## 2020-05-02 MED ORDER — HALOPERIDOL LACTATE 5 MG/ML IJ SOLN
5.0000 mg | Freq: Once | INTRAMUSCULAR | Status: AC
  Administered 2020-05-03: 5 mg via INTRAMUSCULAR
  Filled 2020-05-02: qty 1

## 2020-05-02 NOTE — Progress Notes (Signed)
Progress Note    CHANAY NUGENT  EZM:[id number] DOB: [birth year]  DOA: 04/20/2020 PCP: Mar Daring, PA-C      Brief Narrative:    Medical records reviewed and are as summarized below:  Sierra Lloyd is a 86 y.o. female  with medical history significant fordiabetes mellitus, hypertension and anxiety who presented to the ER for evaluation after a fall. Patient refused to come to the ER and her niece had to take out IVC papers to be able to get her evaluated. Patient noted to have small right frontal subdural hemorrhage, right forehead  hematoma neurosurgeon consulted in the emergency room recommended conservative management and repeat CT head.  Follow-up repeat CT head did not show any worsening of subdural hemorrhage.       Assessment/Plan:   Principal Problem:   Subdural hemorrhage (HCC) Active Problems:   Diabetes ([place])   Anxiety, generalized   Fall at home, initial encounter   Fall   S/p fall, small right frontal lobe subdural hemorrhage, right scalp soft tissue hematoma: Repeat CT head today (04/22/2020) did not show any worsening of subdural hemorrhage.  Avoid antiplatelets or anticoagulants.  No surgical intervention per neurosurgeon.   Left leg pain: Venous duplex of the lower extremities was ordered to rule out DVT but this could not be performed because patient was agitated and screaming.  Analgesics as needed for pain in the left leg.  Haldol IM has been ordered for agitation so that venous duplex scan safely be performed.  This was discussed with Leafy Ro.  I requested her ultrasound be performed at the bedside instead of on the radiology unit.  However, I was informed that this cannot be done at the bedside.  Joelene Millin said they will try again tomorrow.  Acute confusional state/altered mental status/delirium: Etiology unclear. Supportive care.   IVC has been rescinded by psychiatrist.  Hypertension: Continue antihypertensives  Right labium majus  mass: cyst vs hematoma as per [place]. Continue w/ supportive care. Recommend outpatient f/u w/ GYN  Anxiety, PTSD: Xanax as needed for anxiety  Blisters on her back: Turn patient every 2 hours.  Mepilex border has been applied to affected areas.  AKI, leukocytosis: Resolved  Body mass index is 21.79 kg/m.   Family Communication/Anticipated D/C date and plan/Code Status   DVT prophylaxis: SCD Code Status: Full code Family Communication: Plan discussed with her niece over the phone Disposition Plan:    Status is: Inpatient  Remains inpatient appropriate because:Unsafe d/c plan   Dispo: The patient is from: Home              Anticipated d/c is to: SNF              Anticipated d/c date is: 1 day              Patient currently is not medically stable to d/c. Awaiting placement to SNF.           Subjective:   She still complains of pain in the left leg.  It is not clear if she is providing a reliable history.  Sometimes she screams and yells even without touching her.  She has had intermittent confusion.  Objective:    Vitals:   05/01/20 1630 05/02/20 0028 05/02/20 0324 05/02/20 0733  BP: (!) 148/63 (!) 146/55 (!) 120/50 (!) 147/64  Pulse: 67 71 (!) 54 (!) 56  Resp: 12 16 16 16   Temp: 98.9 F (37.2 C) 99.2 F (37.3 C)  98.7 F (37.1 C) 98.5 F (36.9 C)  TempSrc: Oral Oral  Oral  SpO2: 96% 98% 100% 100%  Weight:      Height:       No data found.   Intake/Output Summary (Last 24 hours) at 05/02/2020 1251 Last data filed at 05/02/2020 1100 Gross per 24 hour  Intake 480 ml  Output 525 ml  Net -45 ml   Filed Weights   04/20/20 2122  Weight: 50.6 kg    Exam:  GEN: NAD SKIN: Swelling on the right forehead, extensive bruising/ecchymosis on the right side of her body including her abdomen, right hip and buttock.  She also has bruises on her chest. She has developed few blisters on her left upper back. Ruptured blister on left side of sacrum EYES: No pallor  or icterus ENT: MMM CV: RRR PULM: No wheezing or rales heard ABD: soft, ND, NT, +BS CNS: Alert, confused , screams intermittently EXT: Swelling of bilateral upper extremities with no swelling noted in lower extremities.   Data Reviewed:   I have personally reviewed following labs and imaging studies:  Labs: Labs show the following:   Basic Metabolic Panel: Recent Labs  Lab 04/26/20 0403 04/26/20 0403 04/27/20 0654 04/27/20 0654 04/28/20 0704 04/28/20 0704 04/29/20 0407 04/30/20 0445  NA 140  --  142  --  141  --  140 141  K 4.1   < > 4.1   < > 4.1   < > 4.0 3.8  CL 106  --  109  --  108  --  107 108  CO2 27  --  25  --  26  --  26 27  GLUCOSE 134*  --  177*  --  150*  --  213* 164*  BUN 61*  --  41*  --  39*  --  35* 32*  CREATININE 1.20*  --  0.91  --  0.88  --  0.84 0.70  CALCIUM 9.1  --  9.1  --  8.9  --  9.2 8.9   < > = values in this interval not displayed.   GFR Estimated Creatinine Clearance: 37.6 mL/min (by C-G formula based on SCr of 0.7 mg/dL). Liver Function Tests: No results for input(s): AST, ALT, ALKPHOS, BILITOT, PROT, ALBUMIN in the last 168 hours. No results for input(s): LIPASE, AMYLASE in the last 168 hours. No results for input(s): AMMONIA in the last 168 hours. Coagulation profile No results for input(s): INR, PROTIME in the last 168 hours.  CBC: Recent Labs  Lab 04/26/20 0403 04/27/20 0654 04/28/20 0704 04/29/20 0407 04/30/20 0445  WBC 8.7 9.7 8.9 9.7 8.8  HGB 7.1* 8.2* 8.2* 8.1* 7.8*  HCT 22.2* 26.0* 24.3* 25.1* 23.5*  MCV 92.5 94.9 91.7 93.3 91.8  PLT 188 230 217 242 216   Cardiac Enzymes: No results for input(s): CKTOTAL, CKMB, CKMBINDEX, TROPONINI in the last 168 hours. BNP (last 3 results) No results for input(s): PROBNP in the last 8760 hours. CBG: Recent Labs  Lab 05/01/20 1628 05/01/20 1704 05/01/20 2132 05/02/20 0733 05/02/20 1135  GLUCAP 205* 221* 152* 133* 219*   D-Dimer: No results for input(s): DDIMER in the  last 72 hours. Hgb A1c: No results for input(s): HGBA1C in the last 72 hours. Lipid Profile: No results for input(s): CHOL, HDL, LDLCALC, TRIG, CHOLHDL, LDLDIRECT in the last 72 hours. Thyroid function studies: No results for input(s): TSH, T4TOTAL, T3FREE, THYROIDAB in the last 72 hours.  Invalid input(s): FREET3 Anemia  work up: No results for input(s): VITAMINB12, FOLATE, FERRITIN, TIBC, IRON, RETICCTPCT in the last 72 hours. Sepsis Labs: Recent Labs  Lab 04/27/20 0654 04/28/20 0704 04/29/20 0407 04/30/20 0445  WBC 9.7 8.9 9.7 8.8    Microbiology No results found for this or any previous visit (from the past 240 hour(s)).  Procedures and diagnostic studies:  No results found.  Medications:   . acyclovir ointment   Topical 5 X Daily  . ALPRAZolam  0.25 mg Oral TID  . bisoprolol-hydrochlorothiazide  1 tablet Oral Daily  . cholecalciferol  1,000 Units Oral Daily  . DULoxetine  20 mg Oral Daily  . feeding supplement (ENSURE ENLIVE)  237 mL Oral TID BM  . gabapentin  300 mg Oral QHS  . haloperidol  0.25 mg Oral TID  . haloperidol lactate  5 mg Intramuscular Once  . insulin aspart  0-15 Units Subcutaneous TID WC  . senna-docusate  1 tablet Oral BID  . sodium chloride flush  3 mL Intravenous Q12H  . vitamin B-12  1,000 mcg Oral Daily   Continuous Infusions: . sodium chloride       LOS: 11 days   Deaundra Dupriest  [facility]     05/02/2020, 12:51 PM

## 2020-05-03 ENCOUNTER — Inpatient Hospital Stay: Payer: Medicare Other

## 2020-05-03 ENCOUNTER — Encounter: Payer: Self-pay | Admitting: Internal Medicine

## 2020-05-03 LAB — BASIC METABOLIC PANEL
Anion gap: 8 (ref 5–15)
BUN: 48 mg/dL — ABNORMAL HIGH (ref 8–23)
CO2: 28 mmol/L (ref 22–32)
Calcium: 9.1 mg/dL (ref 8.9–10.3)
Chloride: 103 mmol/L (ref 98–111)
Creatinine, Ser: 0.87 mg/dL (ref 0.44–1.00)
GFR calc Af Amer: >60 mL/min (ref >60)
GFR calc non Af Amer: >60 mL/min (ref >60)
Glucose, Bld: 199 mg/dL — ABNORMAL HIGH (ref 70–99)
Potassium: 4.3 mmol/L (ref 3.5–5.1)
Sodium: 139 mmol/L (ref 135–145)

## 2020-05-03 LAB — GLUCOSE, CAPILLARY
Glucose-Capillary: 173 mg/dL — ABNORMAL HIGH (ref 70–99)
Glucose-Capillary: 189 mg/dL — ABNORMAL HIGH (ref 70–99)
Glucose-Capillary: 159 mg/dL — ABNORMAL HIGH (ref 70–99)
Glucose-Capillary: 137 mg/dL — ABNORMAL HIGH (ref 70–99)

## 2020-05-03 LAB — CBC WITH DIFFERENTIAL/PLATELET
Abs Immature Granulocytes: 0.1 10*3/uL — ABNORMAL HIGH (ref 0.00–0.07)
Basophils Absolute: 0 10*3/uL (ref 0.0–0.1)
Basophils Relative: 0 %
Eosinophils Absolute: 0.8 10*3/uL — ABNORMAL HIGH (ref 0.0–0.5)
Eosinophils Relative: 7 %
HCT: 27 % — ABNORMAL LOW (ref 36.0–46.0)
Hemoglobin: 8.6 g/dL — ABNORMAL LOW (ref 12.0–15.0)
Immature Granulocytes: 1 %
Lymphocytes Relative: 16 %
Lymphs Abs: 1.8 10*3/uL (ref 0.7–4.0)
MCH: 29.8 pg (ref 26.0–34.0)
MCHC: 31.9 g/dL (ref 30.0–36.0)
MCV: 93.4 fL (ref 80.0–100.0)
Monocytes Absolute: 1.1 10*3/uL — ABNORMAL HIGH (ref 0.1–1.0)
Monocytes Relative: 10 %
Neutro Abs: 7.3 10*3/uL (ref 1.7–7.7)
Neutrophils Relative %: 66 %
Platelets: 205 10*3/uL (ref 150–400)
RBC: 2.89 MIL/uL — ABNORMAL LOW (ref 3.87–5.11)
RDW: 14.3 % (ref 11.5–15.5)
WBC: 11 10*3/uL — ABNORMAL HIGH (ref 4.0–10.5)
nRBC: 0 % (ref 0.0–0.2)

## 2020-05-03 MED ORDER — IOHEXOL 350 MG/ML SOLN
100.0000 mL | Freq: Once | INTRAVENOUS | Status: AC | PRN
  Administered 2020-05-03: 21:00:00 150 mL via INTRAVENOUS

## 2020-05-03 NOTE — Consult Note (Signed)
Vascular consult note.   Sierra Lloyd JME:[id number] DOB: 1934/03/31 DOA: 04/20/2020  PCP: Margaretann Loveless, PA-C     Chief Complaint:  S/P Fall  HPI: Sierra Lloyd is a 86 y.o. female with medical history significant for diabetes mellitus, hypertension and anxiety who presented to the ER for evaluation after a fall. Patient refused to come to the ER and her niece had to take out IVC papers to be able to get her evaluated. Patient noted to have a hematoma on her forehead and significant bruising over the right thigh. She moans out in pain with any form of movement especially when she turns in bed. Patient had a CT scan of the head which shows very small  amount of mixed density subdural hemorrhage overlying the right frontal lobe measuring 4 mm in maximum diameter. No midline shift. Findings consistent with age related atrophy and chronic small vessel ischemia. Soft tissue hematoma overlying the right frontal skull. Transfer to [facility] was discussed with patient's niece who declined because of her onset severe anxiety and opted to have her observed at [facility] and to get a repeat CT scan of the head to evaluate for worsening bleed. Patient had another fall in her room that was unwitnessed, she had a bed alarm that never went off. She had a repeat CT scan which was actually the third one and which was unchanged. Neurosurgeon was consulted in the ER who recommended to repeat CT scan in 24 hours but that no intervention or transfer was needed at this time. Patient is agitated and very anxious and keeps requesting to be discharged. Unable to do a review of systems on this patient due to her mental status Had an extensive discussion with her niece over the phone Sierra Lloyd who is in agreement for patient to be hospitalized and is also in agreement for short-term subacute rehab with goals of getting the patient home.  ED Course: 86 year old Caucasian female who was  brought into the emergency room by her niece who had to take her IVC papers on her to be able to get her to the ER for evaluation. Patient is status post fall and has a tiny subdural hemorrhage. Neurosurgery was consulted and a repeat CT scan of the head was recommended in 24 hours.  Hospital course:  Duplex ultrasound was obtained to evaluate bil leg swelling and c/o pain in her left leg. Incidental thrombus seen isolated in right profunda vein with no other thrombus anywhere in the legs. Review of Systems: As per HPI otherwise 10 point review of systems negative.    Past Medical History:  Diagnosis Date  . Allergy   . Anxiety   . Arthritis   . Diabetes mellitus without complication (HCC)   . Hypertension     Past Surgical History:  Procedure Laterality Date  . CATARACT EXTRACTION W/PHACO Left 04/12/2016   Procedure: CATARACT EXTRACTION PHACO AND INTRAOCULAR LENS PLACEMENT (IOC);  Surgeon: Sallee Lange, MD;  Location: [facility] ORS;  Service: Ophthalmology;  Laterality: Left;  [place]  02:06 AP% 26.8 CDE 55.05 fluid pack lot # [id number] H     reports that she has never smoked. She has never used smokeless tobacco. She reports current alcohol use. She reports that she does not use drugs.  Allergies  Allergen Reactions  . Tetracycline     Roof of mouth broke out.    Family History  Problem Relation Age of Onset  . Asthma Mother   . Anxiety  disorder Mother   . Lung disease Mother   . COPD Sister      Prior to Admission medications   Medication Sig Start Date End Date Taking? Authorizing Provider  ALPRAZolam Prudy Feeler) 0.5 MG tablet TAKE 1/2 TO 1 TABLET BY MOUTH TWICE DAILY AS NEEDED ANXIETY 12/20/19   Margaretann Loveless, PA-C  aspirin 81 MG tablet Take 81 mg by mouth daily.    [provider]  augmented betamethasone dipropionate (DIPROLENE-AF) 0.05 % cream APPLY TO AFFECTED AREA(s) TWICE DAILY 01/17/20   Margaretann Loveless, PA-C  bisoprolol-hydrochlorothiazide [facility])  10-6.25 MG tablet Take 1 tablet by mouth daily. 01/17/20   Margaretann Loveless, PA-C  Boswellia-Glucosamine-Vit D (GLUCOSAMINE COMPLEX PO) Take by mouth daily as needed. 1500    [provider]  cholecalciferol (VITAMIN D) 1000 UNITS tablet Take 1,000 Units by mouth.     [provider]  glucose blood test strip Use to test blood sugar once a day.  DX: E11.9. 11/13/15   Lorie Phenix, MD  metFORMIN (GLUCOPHAGE) 500 MG tablet Take 1 tablet (500 mg total) by mouth 2 (two) times daily. 01/17/20   Margaretann Loveless, PA-C  OMEGA-3 FATTY ACIDS PO Take by mouth.     [provider]  triamcinolone cream (KENALOG) 0.1 % APPLY TO AFFECTED AREA(s) TWICE DAILY 04/17/20   Margaretann Loveless, PA-C  vitamin B-12 (CYANOCOBALAMIN) 1000 MCG tablet Take 1 tablet (1,000 mcg total) by mouth daily. 07/15/15   Lorie Phenix, MD  VITAMIN E-1000 PO Take by mouth.     [provider]  augmented betamethasone dipropionate (DIPROLENE-AF) 0.05 % cream APPLY TO AFFECTED AREA(s) TWICE DAILY 04/18/19   Margaretann Loveless, [place]    Physical Exam: Vitals:   05/02/20 1615 05/02/20 2335 05/03/20 0809 05/03/20 1625  BP: (!) 148/64 (!) 154/71 140/64 (!) 162/66  Pulse: 60 72 (!) 57 (!) 52  Resp: 14 19 16 18   Temp: 98.6 F (37 C) 98 F (36.7 C) 98.8 F (37.1 C) (!) 96.5 F (35.8 C)  TempSrc: Oral   Axillary  SpO2: 100% 100% 100% 100%  Weight:      Height:         Vitals:   05/02/20 1615 05/02/20 2335 05/03/20 0809 05/03/20 1625  BP: (!) 148/64 (!) 154/71 140/64 (!) 162/66  Pulse: 60 72 (!) 57 (!) 52  Resp: 14 19 16 18   Temp: 98.6 F (37 C) 98 F (36.7 C) 98.8 F (37.1 C) (!) 96.5 F (35.8 C)  TempSrc: Oral   Axillary  SpO2: 100% 100% 100% 100%  Weight:      Height:        Constitutional: sleeping but will at times moan of pain , hematoma of the right forehead Eyes: PERRL, lids and conjunctivae normal ENMT: Mucous membranes are moist.  Neck: normal, supple, no masses,  no thyromegaly Respiratory: clear to auscultation bilaterally, no wheezing, no crackles. Normal respiratory effort. No accessory muscle use.  Cardiovascular: Regular rate and rhythm, no murmurs / rubs / gallops. No extremity edema. 2+ pedal pulses. No carotid bruits.  Abdomen: no tenderness, no masses palpated. No hepatosplenomegaly. Bowel sounds positive.  Musculoskeletal: no clubbing / cyanosis. Decreased range of motion right hip  Skin: no rashes, lesions, ulcers. Ecchymosis over right hip Neurologic: Moves all extremities person Psychiatric: Agitated and anxious   Labs on Admission: I have personally reviewed following labs and imaging studies  CBC: Recent Labs  Lab 04/27/20 0654 04/28/20 0704 04/29/20 0407 04/30/20  0445 05/03/20 0604  WBC 9.7 8.9 9.7 8.8 11.0*  NEUTROABS  --   --   --   --  7.3  HGB 8.2* 8.2* 8.1* 7.8* 8.6*  HCT 26.0* 24.3* 25.1* 23.5* 27.0*  MCV 94.9 91.7 93.3 91.8 93.4  PLT 230 217 242 216 161   Basic Metabolic Panel: Recent Labs  Lab 04/27/20 0654 04/28/20 0704 04/29/20 0407 04/30/20 0445 05/03/20 0604  NA 142 141 140 141 139  K 4.1 4.1 4.0 3.8 4.3  CL 109 108 107 108 103  CO2 25 26 26 27 28   GLUCOSE 177* 150* 213* 164* 199*  BUN 41* 39* 35* 32* 48*  CREATININE 0.91 0.88 0.84 0.70 0.87  CALCIUM 9.1 8.9 9.2 8.9 9.1   GFR: Estimated Creatinine Clearance: 34.6 mL/min (by C-G formula based on SCr of 0.87 mg/dL). Liver Function Tests: No results for input(s): AST, ALT, ALKPHOS, BILITOT, PROT, ALBUMIN in the last 168 hours. No results for input(s): LIPASE, AMYLASE in the last 168 hours. No results for input(s): AMMONIA in the last 168 hours. Coagulation Profile: No results for input(s): INR, PROTIME in the last 168 hours. Cardiac Enzymes: No results for input(s): CKTOTAL, CKMB, CKMBINDEX, TROPONINI in the last 168 hours. BNP (last 3 results) No results for input(s): PROBNP in the last 8760 hours. HbA1C: No results for input(s): HGBA1C in the  last 72 hours. CBG: Recent Labs  Lab 05/02/20 1135 05/02/20 1615 05/03/20 0808 05/03/20 1211 05/03/20 1623  GLUCAP 219* 242* 173* 189* 159*   Lipid Profile: No results for input(s): CHOL, HDL, LDLCALC, TRIG, CHOLHDL, LDLDIRECT in the last 72 hours. Thyroid Function Tests: No results for input(s): TSH, T4TOTAL, FREET4, T3FREE, THYROIDAB in the last 72 hours. Anemia Panel: No results for input(s): VITAMINB12, FOLATE, FERRITIN, TIBC, IRON, RETICCTPCT in the last 72 hours. Urine analysis:    Component Value Date/Time   COLORURINE YELLOW (A) 04/21/2020 0743   APPEARANCEUR HAZY (A) 04/21/2020 0743   LABSPEC 1.023 04/21/2020 0743   PHURINE 5.0 04/21/2020 0743   GLUCOSEU 50 (A) 04/21/2020 0743   HGBUR NEGATIVE 04/21/2020 0743   BILIRUBINUR NEGATIVE 04/21/2020 0743   KETONESUR 5 (A) 04/21/2020 0743   PROTEINUR 30 (A) 04/21/2020 0743   NITRITE NEGATIVE 04/21/2020 0743   LEUKOCYTESUR NEGATIVE 04/21/2020 0743    Radiological Exams on Admission: US Venous Img Lower Bilateral (DVT)  Result Date: 05/03/2020 CLINICAL DATA:  Bilateral lower extremity pain and edema. EXAM: BILATERAL LOWER EXTREMITY VENOUS DOPPLER ULTRASOUND TECHNIQUE: Gray-scale sonography with graded compression, as well as color Doppler and duplex ultrasound were performed to evaluate the lower extremity deep venous systems from the level of the common femoral vein and including the common femoral, femoral, profunda femoral, popliteal and calf veins including the posterior tibial, peroneal and gastrocnemius veins when visible. The superficial great saphenous vein was also interrogated. Spectral Doppler was utilized to evaluate flow at rest and with distal augmentation maneuvers in the common femoral, femoral and popliteal veins. COMPARISON:  None. FINDINGS: RIGHT LOWER EXTREMITY Common Femoral Vein: No evidence of thrombus. Normal compressibility, respiratory phasicity and response to augmentation. Saphenofemoral Junction: No  evidence of thrombus. Normal compressibility and flow on color Doppler imaging. Profunda Femoral Vein: Nonocclusive thrombus identified in the profunda femoral vein on the right. Femoral Vein: No evidence of thrombus. Normal compressibility, respiratory phasicity and response to augmentation. Popliteal Vein: No evidence of thrombus. Normal compressibility, respiratory phasicity and response to augmentation. Calf Veins: No evidence of thrombus. Normal compressibility and flow on color  Doppler imaging. Superficial Great Saphenous Vein: No evidence of thrombus. Normal compressibility. Venous Reflux:  None. Other Findings: No evidence of superficial thrombophlebitis or abnormal fluid collection. LEFT LOWER EXTREMITY Common Femoral Vein: No evidence of thrombus. Normal compressibility, respiratory phasicity and response to augmentation. Saphenofemoral Junction: No evidence of thrombus. Normal compressibility and flow on color Doppler imaging. Profunda Femoral Vein: No evidence of thrombus. Normal compressibility and flow on color Doppler imaging. Femoral Vein: No evidence of thrombus. Normal compressibility, respiratory phasicity and response to augmentation. Popliteal Vein: No evidence of thrombus. Normal compressibility, respiratory phasicity and response to augmentation. Calf Veins: No evidence of thrombus. Normal compressibility and flow on color Doppler imaging. Superficial Great Saphenous Vein: No evidence of thrombus. Normal compressibility. Venous Reflux:  None. Other Findings: No evidence of superficial thrombophlebitis or abnormal fluid collection. IMPRESSION: DVT isolated to the profunda femoral vein on the right with nonocclusive thrombus identified. No other thrombus is identified bilaterally. Electronically Signed   By: Irish Lack M.D.   On: 05/03/2020 13:36   DG Humerus Right  Result Date: 05/02/2020 CLINICAL DATA:  Recent fall with right arm pain, initial encounter EXAM: RIGHT HUMERUS - 2+ VIEW  COMPARISON:  None. FINDINGS: Degenerative changes of the acromioclavicular joint and glenohumeral joint are seen. No acute fracture or dislocation is noted. The humeral head is somewhat high-riding likely related to underlying rotator cuff injury. Soft tissue swelling is noted distally likely related to the recent injury. IMPRESSION: Soft tissue swelling distally consistent with the recent injury. No evidence of acute fracture. Findings suggestive of chronic rotator cuff injury. Electronically Signed   By: Alcide Clever M.D.   On: 05/02/2020 20:52    EKG: Independently reviewed.  Supraventricular tachycardia  Assessment/Plan Principal Problem:   Subdural hemorrhage (HCC) Active Problems:   Diabetes (HCC)   Anxiety, generalized   Fall at home, initial encounter   Fall  incidental profunda DVT in right leg  I have reviewed the findings with niece and discussed with hospitalist Would obtain CT venogram of abdomen and pelvis to r/o iliac vein thrombus. It is extremely unusal to have a profunda vein thrombus isolated to that vein unless this was subacute part of another DVT in the past  IF Ct shows no DVT in pelvis, would not recommend any intervention or IVC filter. Will discuss IVC filter if pelvic thrombus seen.  Would consider palliative care or chaplain consult to help family with end of life idecisions.  Griffith Citron MD [facility]     05/03/2020, 6:24 PM

## 2020-05-03 NOTE — Progress Notes (Addendum)
Progress Note    JAHNAYA BRANSCOME  UKG:[id number] DOB: [birth year]  DOA: 04/20/2020 PCP: Margaretann Loveless, PA-C      Brief Narrative:    Medical records reviewed and are as summarized below:  [name] is a 86 y.o. female  with medical history significant fordiabetes mellitus, hypertension and anxiety who presented to the ER for evaluation after a fall. Patient refused to come to the ER and her niece had to take out IVC papers to be able to get her evaluated. Patient noted to have small right frontal subdural hemorrhage, right forehead  hematoma neurosurgeon consulted in the emergency room recommended conservative management and repeat CT head.  Follow-up repeat CT head did not show any worsening of subdural hemorrhage.       Assessment/Plan:   Principal Problem:   Subdural hemorrhage (HCC) Active Problems:   Diabetes (HCC)   Anxiety, generalized   Fall at home, initial encounter   Fall   S/p fall, small right frontal lobe subdural hemorrhage, right scalp soft tissue hematoma: Repeat CT head today (04/22/2020) did not show any worsening of subdural hemorrhage.  Avoid antiplatelets or anticoagulants.  No surgical intervention per neurosurgeon.   Left leg pain: Venous duplex of the lower extremities showed right profunda femoral DVT.  Analgesics as needed for pain.  Consulted vascular surgeon on-call, Dr. Consuello Closs, for IVC filter.  He recommended CT venogram of abdomen and pelvis for further evaluation.  Right arm soft tissue swelling: This is likely due to recent fall.  X-ray of the humerus did not show any acute abnormality.  Acute confusional state/altered mental status/delirium: Etiology unclear. Supportive care.   IVC has been rescinded by psychiatrist.  Hypertension: Continue antihypertensives  Right labium majus mass: cyst vs hematoma as per [place]. Continue w/ supportive care. Recommend outpatient f/u w/ GYN  Anxiety, PTSD: Xanax as needed for  anxiety  Blisters on her back: Turn patient every 2 hours.  Mepilex border has been applied to affected areas.  AKI, leukocytosis: Resolved  Body mass index is 21.79 kg/m.   Family Communication/Anticipated D/C date and plan/Code Status   DVT prophylaxis: SCD Code Status: Full code Family Communication: None Disposition Plan:    Status is: Inpatient  Remains inpatient appropriate because:Unsafe d/c plan   Dispo: The patient is from: Home              Anticipated d/c is to: SNF              Anticipated d/c date is: 1 day              Patient currently is not medically stable to d/c. Awaiting placement to SNF.           Subjective:   She does not provide any history.  She looks comfortable.  Objective:    Vitals:   05/02/20 1325 05/02/20 1615 05/02/20 2335 05/03/20 0809  BP: 133/67 (!) 148/64 (!) 154/71 140/64  Pulse: (!) 58 60 72 (!) 57  Resp: 16 14 19 16   Temp: 97.9 F (36.6 C) 98.6 F (37 C) 98 F (36.7 C) 98.8 F (37.1 C)  TempSrc: Oral Oral    SpO2: 100% 100% 100% 100%  Weight:      Height:       No data found.   Intake/Output Summary (Last 24 hours) at 05/03/2020 1133 Last data filed at 05/02/2020 2050 Gross per 24 hour  Intake 20 ml  Output 300 ml  Net -  280 ml   Filed Weights   04/20/20 2122  Weight: 50.6 kg    Exam:  GEN: NAD SKIN: Swelling on the right forehead, extensive bruising/ecchymosis on the right side of her body including her abdomen, right hip and buttock.  She also has bruises on her chest. She has developed few blisters on her left upper back. Ruptured blister on left side of sacrum EYES: No pallor or icterus ENT: MMM CV: RRR PULM: Clear to auscultation bilaterally ABD: soft, ND, NT, +BS CNS: Alert, confused , screams intermittently EXT: Swelling of bilateral upper extremities (right arm is more swollen than the left).  Tenderness in the left leg but no swelling or erythema in the lower extremities.   Data  Reviewed:   I have personally reviewed following labs and imaging studies:  Labs: Labs show the following:   Basic Metabolic Panel: Recent Labs  Lab 04/27/20 0654 04/27/20 0654 04/28/20 0704 04/28/20 0704 04/29/20 0407 04/29/20 0407 04/30/20 0445 05/03/20 0604  NA 142  --  141  --  140  --  141 139  K 4.1   < > 4.1   < > 4.0   < > 3.8 4.3  CL 109  --  108  --  107  --  108 103  CO2 25  --  26  --  26  --  27 28  GLUCOSE 177*  --  150*  --  213*  --  164* 199*  BUN 41*  --  39*  --  35*  --  32* 48*  CREATININE 0.91  --  0.88  --  0.84  --  0.70 0.87  CALCIUM 9.1  --  8.9  --  9.2  --  8.9 9.1   < > = values in this interval not displayed.   GFR Estimated Creatinine Clearance: 34.6 mL/min (by C-G formula based on SCr of 0.87 mg/dL). Liver Function Tests: No results for input(s): AST, ALT, ALKPHOS, BILITOT, PROT, ALBUMIN in the last 168 hours. No results for input(s): LIPASE, AMYLASE in the last 168 hours. No results for input(s): AMMONIA in the last 168 hours. Coagulation profile No results for input(s): INR, PROTIME in the last 168 hours.  CBC: Recent Labs  Lab 04/27/20 0654 04/28/20 0704 04/29/20 0407 04/30/20 0445 05/03/20 0604  WBC 9.7 8.9 9.7 8.8 11.0*  NEUTROABS  --   --   --   --  7.3  HGB 8.2* 8.2* 8.1* 7.8* 8.6*  HCT 26.0* 24.3* 25.1* 23.5* 27.0*  MCV 94.9 91.7 93.3 91.8 93.4  PLT 230 217 242 216 205   Cardiac Enzymes: No results for input(s): CKTOTAL, CKMB, CKMBINDEX, TROPONINI in the last 168 hours. BNP (last 3 results) No results for input(s): PROBNP in the last 8760 hours. CBG: Recent Labs  Lab 05/01/20 2132 05/02/20 0733 05/02/20 1135 05/02/20 1615 05/03/20 0808  GLUCAP 152* 133* 219* 242* 173*   D-Dimer: No results for input(s): DDIMER in the last 72 hours. Hgb A1c: No results for input(s): HGBA1C in the last 72 hours. Lipid Profile: No results for input(s): CHOL, HDL, LDLCALC, TRIG, CHOLHDL, LDLDIRECT in the last 72 hours. Thyroid  function studies: No results for input(s): TSH, T4TOTAL, T3FREE, THYROIDAB in the last 72 hours.  Invalid input(s): FREET3 Anemia work up: No results for input(s): VITAMINB12, FOLATE, FERRITIN, TIBC, IRON, RETICCTPCT in the last 72 hours. Sepsis Labs: Recent Labs  Lab 04/28/20 0704 04/29/20 0407 04/30/20 0445 05/03/20 0604  WBC 8.9 9.7 8.8 11.0*  Microbiology No results found for this or any previous visit (from the past 240 hour(s)).  Procedures and diagnostic studies:  DG Humerus Right  Result Date: 05/02/2020 CLINICAL DATA:  Recent fall with right arm pain, initial encounter EXAM: RIGHT HUMERUS - 2+ VIEW COMPARISON:  None. FINDINGS: Degenerative changes of the acromioclavicular joint and glenohumeral joint are seen. No acute fracture or dislocation is noted. The humeral head is somewhat high-riding likely related to underlying rotator cuff injury. Soft tissue swelling is noted distally likely related to the recent injury. IMPRESSION: Soft tissue swelling distally consistent with the recent injury. No evidence of acute fracture. Findings suggestive of chronic rotator cuff injury. Electronically Signed   By: Inez Catalina M.D.   On: 05/02/2020 20:52    Medications:   . ALPRAZolam  0.25 mg Oral TID  . bisoprolol-hydrochlorothiazide  1 tablet Oral Daily  . cholecalciferol  1,000 Units Oral Daily  . DULoxetine  20 mg Oral Daily  . feeding supplement (ENSURE ENLIVE)  237 mL Oral TID BM  . gabapentin  300 mg Oral QHS  . haloperidol  0.25 mg Oral TID  . haloperidol lactate  5 mg Intramuscular Once  . insulin aspart  0-15 Units Subcutaneous TID WC  . senna-docusate  1 tablet Oral BID  . sodium chloride flush  3 mL Intravenous Q12H  . vitamin B-12  1,000 mcg Oral Daily   Continuous Infusions: . sodium chloride       LOS: 12 days   Staley Budzinski  [facility]     05/03/2020, 11:33 AM

## 2020-05-04 ENCOUNTER — Encounter
Admission: EM | Disposition: A | Discharge status: Skilled Nursing Facility | Payer: Self-pay | Source: Home / Self Care | Attending: Internal Medicine

## 2020-05-04 DIAGNOSIS — I824Y1 Acute embolism and thrombosis of unspecified deep veins of right proximal lower extremity: Secondary | ICD-10-CM

## 2020-05-04 HISTORY — PX: IVC FILTER INSERTION: CATH118245

## 2020-05-04 LAB — GLUCOSE, CAPILLARY
Glucose-Capillary: 152 mg/dL — ABNORMAL HIGH (ref 70–99)
Glucose-Capillary: 115 mg/dL — ABNORMAL HIGH (ref 70–99)
Glucose-Capillary: 131 mg/dL — ABNORMAL HIGH (ref 70–99)

## 2020-05-04 SURGERY — IVC FILTER INSERTION
Anesthesia: Moderate Sedation

## 2020-05-04 MED ORDER — FENTANYL CITRATE (PF) 100 MCG/2ML IJ SOLN
INTRAMUSCULAR | Status: DC | PRN
  Administered 2020-05-04 (×2): 25 ug via INTRAVENOUS

## 2020-05-04 MED ORDER — MIDAZOLAM HCL 2 MG/2ML IJ SOLN
INTRAMUSCULAR | Status: DC | PRN
  Administered 2020-05-04 (×2): 0.5 mg via INTRAVENOUS

## 2020-05-04 MED ORDER — FENTANYL CITRATE (PF) 100 MCG/2ML IJ SOLN
INTRAMUSCULAR | Status: AC
  Filled 2020-05-04: qty 2

## 2020-05-04 MED ORDER — MIDAZOLAM HCL 2 MG/2ML IJ SOLN
INTRAMUSCULAR | Status: AC
  Filled 2020-05-04: qty 2

## 2020-05-04 SURGICAL SUPPLY — 4 items
CANNULA 5F STIFF (CANNULA) ×3 IMPLANT
KIT FEMORAL DEL DENALI (Miscellaneous) ×3 IMPLANT
PACK ANGIOGRAPHY (CUSTOM PROCEDURE TRAY) ×3 IMPLANT
WIRE BENTSON .035X145CM (WIRE) ×3 IMPLANT

## 2020-05-04 NOTE — Op Note (Signed)
[facility]   OPERATIVE NOTE    PRE-OPERATIVE DIAGNOSIS: DVT with PE  POST-OPERATIVE DIAGNOSIS: Same  PROCEDURE: 1.   Ultrasound guidance for vascular access to the left femoral vein 2.   Catheter placement into the inferior vena cava 3.   Inferior venacavogram 4.   Placement of a [place] IVC filter  SURGEON: Gladstone Pih Bobie Kistler  ASSISTANT(S): None  ANESTHESIA: Conscious sedation was administered by the interventional radiology RN under my direct supervision. IV Versed plus fentanyl were utilized. Continuous ECG, pulse oximetry and blood pressure was monitored throughout the entire procedure. Conscious sedation was for a total of 20 minutes.  ESTIMATED BLOOD LOSS: minimal  FINDING(S): 1.  Patent IVC  SPECIMEN(S):  none  INDICATIONS:   Sierra Lloyd is a 86 y.o. y.o. female who presents with right leg DVT and subdural hematoma.  Inferior vena cava filter is indicated for this reason.  Risks and benefits including filter thrombosis, migration, fracture, bleeding, and infection were all discussed.  We discussed that all IVC filters that we place can be removed if desired from the patient once the need for the filter has passed.    DESCRIPTION: After obtaining full informed written consent, the patient was brought back to the vascular suite. The skin was sterilely prepped and draped in a sterile surgical field was created. Ultrasound was placed in a sterile sleeve. The left CFV was echolucent and compressible indicating patency. Image was recorded for the permanent record. The puncture was made under continuous real-time ultrasound guidance.  The CFV was accessed under direct ultrasound guidance without difficulty with a micropuncture needle. Microwire was then advanced under fluoroscopic guidance without difficulty. Micro-sheath was then inserted and a J-wire was then placed. The dilator is passed over the wire and the delivery sheath was placed into the inferior vena  cava.  Inferior venacavogram was performed. This demonstrated a patent IVC with the level of the renal veins .  The filter was then deployed into the inferior vena cava at the level of l3 just below the renal veins. The delivery sheath was then removed. Pressure was held. Sterile dressings were placed. The patient tolerated the procedure well and was taken to the recovery room in stable condition.  Interpretation: Well deployed IVC filter  COMPLICATIONS: None  CONDITION: Stable  Gladstone Pih Akasia Ahmad  05/04/2020, 3:59 PM

## 2020-05-04 NOTE — Progress Notes (Signed)
CT venogram obtained and results discussed with vascular surgeon, Dr. Simonne Maffucci. Per Dr. Simonne Maffucci, we will keep n.p.o. for IVC filter placement in the a.m.

## 2020-05-04 NOTE — Progress Notes (Signed)
Held manual pressure x 10 min at 16:20 secondary to bleed to left femoral vein site. Pt. Tolerated well. Site redressed with folded sterile 4x4 and sterile tegaderm. Site clean, dry, intact . Report given to Eunice Blase, RN and site assessed now at bedside. Pt. Stable. Pt. Remains confused except to self.

## 2020-05-04 NOTE — Progress Notes (Addendum)
Progress Note    Sierra Lloyd  TDH:[id number] DOB: 1934-01-12  DOA: 04/20/2020 PCP: Margaretann Loveless, PA-C      Brief Narrative:    Medical records reviewed and are as summarized below:  Sierra Lloyd is a 86 y.o. female  with medical history significant fordiabetes mellitus, hypertension and anxiety who presented to the ER for evaluation after a fall. Patient refused to come to the ER and her niece had to take out IVC papers to be able to get her evaluated. Patient noted to have small right frontal subdural hemorrhage, right forehead  hematoma neurosurgeon consulted in the emergency room recommended conservative management and repeat CT head.  Follow-up repeat CT head did not show any worsening of subdural hemorrhage.  She complained of pain and spasms in the left leg.  Venous duplex of bilateral lower extremities was obtained.  She was found to have right profunda femoral DVT.  CT venogram of abdomen and pelvis was obtained and this showed DVT in the right profunda femoris and superficial femoral veins to nearly extends into the right common femoral vein. Hospital stay was prolonged because of delirium and behavioral issues.    Assessment/Plan:   Principal Problem:   Subdural hemorrhage (HCC) Active Problems:   Diabetes (HCC)   Anxiety, generalized   Fall at home, initial encounter   Fall   Acute deep vein thrombosis (DVT) of proximal vein of right lower extremity (HCC)   S/p fall, small right frontal lobe subdural hemorrhage, right scalp soft tissue hematoma: Repeat CT head today (04/22/2020) did not show any worsening of subdural hemorrhage.  Avoid antiplatelets or anticoagulants.  No surgical intervention per neurosurgeon.   Left leg pain/DVT in right lower extremity: Her pain is in the left leg but DVT is in the right lower extremity.  Plan for IVC filter placement today.  Follow-up with vascular surgeon.  Analgesics as needed for pain.  Analgesics as needed for  pain.  Right arm soft tissue swelling: This is likely due to recent fall.  X-ray of the humerus did not show any acute abnormality.  Acute confusional state/altered mental status/delirium: Etiology unclear. Supportive care.   IVC has been rescinded by psychiatrist.  Hypertension: Continue antihypertensives  Right labium majus mass: cyst vs hematoma as per [place]. Continue w/ supportive care. Recommend outpatient f/u w/ GYN  Anxiety, PTSD: Xanax as needed for anxiety  Blisters on her back: Turn patient every 2 hours.  Mepilex border has been applied to affected areas.  AKI, leukocytosis: Resolved  Body mass index is 21.79 kg/m.   Family Communication/Anticipated D/C date and plan/Code Status   DVT prophylaxis: SCD Code Status: Full code Family Communication: None Disposition Plan:    Status is: Inpatient  Remains inpatient appropriate because:Unsafe d/c plan   Dispo: The patient is from: Home              Anticipated d/c is to: SNF              Anticipated d/c date is: 1 day              Patient currently is not medically stable to d/c. Awaiting placement to SNF.           Subjective:   Overnight events noted.  Patient is unable to provide an adequate history.  She still complains of pain in the left flank.  Objective:    Vitals:   05/03/20 1625 05/03/20 1945 05/03/20 2350 05/04/20 6468  BP: (!) 162/66 127/60 (!) 139/48 126/84  Pulse: (!) 52 (!) 53 (!) 56 69  Resp: 18 19 17 14   Temp: 98.5 F (36.9 C) 97.7 F (36.5 C) 98.2 F (36.8 C) 98.7 F (37.1 C)  TempSrc: Axillary   Oral  SpO2: 100% 100% 98% 97%  Weight:      Height:       No data found.   Intake/Output Summary (Last 24 hours) at 05/04/2020 1008 Last data filed at 05/03/2020 2110 Gross per 24 hour  Intake --  Output 800 ml  Net -800 ml   Filed Weights   04/20/20 2122  Weight: 50.6 kg    Exam:  GEN: NAD SKIN: Swelling on the right forehead, extensive bruising/ecchymosis on the  right side of her body including her abdomen, right hip and buttock.  She also has bruises on her chest. She has developed few blisters on her left upper back. Ruptured blister on left side of sacrum EYES: No pallor or icterus ENT: MMM CV: Regular rate and rhythm PULM: No wheezing or rales heard ABD: soft, ND, NT, +BS CNS: Alert but confused ,  EXT: Swelling of bilateral upper extremities (right arm is more swollen than the left).  Tenderness in the left leg but no swelling or erythema in the lower extremities.   Data Reviewed:   I have personally reviewed following labs and imaging studies:  Labs: Labs show the following:   Basic Metabolic Panel: Recent Labs  Lab 04/28/20 0704 04/28/20 0704 04/29/20 0407 04/29/20 0407 04/30/20 0445 05/03/20 0604  NA 141  --  140  --  141 139  K 4.1   < > 4.0   < > 3.8 4.3  CL 108  --  107  --  108 103  CO2 26  --  26  --  27 28  GLUCOSE 150*  --  213*  --  164* 199*  BUN 39*  --  35*  --  32* 48*  CREATININE 0.88  --  0.84  --  0.70 0.87  CALCIUM 8.9  --  9.2  --  8.9 9.1   < > = values in this interval not displayed.   GFR Estimated Creatinine Clearance: 34.6 mL/min (by C-G formula based on SCr of 0.87 mg/dL). Liver Function Tests: No results for input(s): AST, ALT, ALKPHOS, BILITOT, PROT, ALBUMIN in the last 168 hours. No results for input(s): LIPASE, AMYLASE in the last 168 hours. No results for input(s): AMMONIA in the last 168 hours. Coagulation profile No results for input(s): INR, PROTIME in the last 168 hours.  CBC: Recent Labs  Lab 04/28/20 0704 04/29/20 0407 04/30/20 0445 05/03/20 0604  WBC 8.9 9.7 8.8 11.0*  NEUTROABS  --   --   --  7.3  HGB 8.2* 8.1* 7.8* 8.6*  HCT 24.3* 25.1* 23.5* 27.0*  MCV 91.7 93.3 91.8 93.4  PLT 217 242 216 205   Cardiac Enzymes: No results for input(s): CKTOTAL, CKMB, CKMBINDEX, TROPONINI in the last 168 hours. BNP (last 3 results) No results for input(s): PROBNP in the last 8760  hours. CBG: Recent Labs  Lab 05/03/20 0808 05/03/20 1211 05/03/20 1623 05/03/20 2049 05/04/20 0722  GLUCAP 173* 189* 159* 137* 152*   D-Dimer: No results for input(s): DDIMER in the last 72 hours. Hgb A1c: No results for input(s): HGBA1C in the last 72 hours. Lipid Profile: No results for input(s): CHOL, HDL, LDLCALC, TRIG, CHOLHDL, LDLDIRECT in the last 72 hours. Thyroid function studies:  No results for input(s): TSH, T4TOTAL, T3FREE, THYROIDAB in the last 72 hours.  Invalid input(s): FREET3 Anemia work up: No results for input(s): VITAMINB12, FOLATE, FERRITIN, TIBC, IRON, RETICCTPCT in the last 72 hours. Sepsis Labs: Recent Labs  Lab 04/28/20 0704 04/29/20 0407 04/30/20 0445 05/03/20 0604  WBC 8.9 9.7 8.8 11.0*    Microbiology No results found for this or any previous visit (from the past 240 hour(s)).  Procedures and diagnostic studies:  US Venous Img Lower Bilateral (DVT)  Result Date: 05/03/2020 CLINICAL DATA:  Bilateral lower extremity pain and edema. EXAM: BILATERAL LOWER EXTREMITY VENOUS DOPPLER ULTRASOUND TECHNIQUE: Gray-scale sonography with graded compression, as well as color Doppler and duplex ultrasound were performed to evaluate the lower extremity deep venous systems from the level of the common femoral vein and including the common femoral, femoral, profunda femoral, popliteal and calf veins including the posterior tibial, peroneal and gastrocnemius veins when visible. The superficial great saphenous vein was also interrogated. Spectral Doppler was utilized to evaluate flow at rest and with distal augmentation maneuvers in the common femoral, femoral and popliteal veins. COMPARISON:  None. FINDINGS: RIGHT LOWER EXTREMITY Common Femoral Vein: No evidence of thrombus. Normal compressibility, respiratory phasicity and response to augmentation. Saphenofemoral Junction: No evidence of thrombus. Normal compressibility and flow on color Doppler imaging. Profunda  Femoral Vein: Nonocclusive thrombus identified in the profunda femoral vein on the right. Femoral Vein: No evidence of thrombus. Normal compressibility, respiratory phasicity and response to augmentation. Popliteal Vein: No evidence of thrombus. Normal compressibility, respiratory phasicity and response to augmentation. Calf Veins: No evidence of thrombus. Normal compressibility and flow on color Doppler imaging. Superficial Great Saphenous Vein: No evidence of thrombus. Normal compressibility. Venous Reflux:  None. Other Findings: No evidence of superficial thrombophlebitis or abnormal fluid collection. LEFT LOWER EXTREMITY Common Femoral Vein: No evidence of thrombus. Normal compressibility, respiratory phasicity and response to augmentation. Saphenofemoral Junction: No evidence of thrombus. Normal compressibility and flow on color Doppler imaging. Profunda Femoral Vein: No evidence of thrombus. Normal compressibility and flow on color Doppler imaging. Femoral Vein: No evidence of thrombus. Normal compressibility, respiratory phasicity and response to augmentation. Popliteal Vein: No evidence of thrombus. Normal compressibility, respiratory phasicity and response to augmentation. Calf Veins: No evidence of thrombus. Normal compressibility and flow on color Doppler imaging. Superficial Great Saphenous Vein: No evidence of thrombus. Normal compressibility. Venous Reflux:  None. Other Findings: No evidence of superficial thrombophlebitis or abnormal fluid collection. IMPRESSION: DVT isolated to the profunda femoral vein on the right with nonocclusive thrombus identified. No other thrombus is identified bilaterally. Electronically Signed   By: Aletta Edouard M.D.   On: 05/03/2020 13:36   DG Humerus Right  Result Date: 05/02/2020 CLINICAL DATA:  Recent fall with right arm pain, initial encounter EXAM: RIGHT HUMERUS - 2+ VIEW COMPARISON:  None. FINDINGS: Degenerative changes of the acromioclavicular joint and  glenohumeral joint are seen. No acute fracture or dislocation is noted. The humeral head is somewhat high-riding likely related to underlying rotator cuff injury. Soft tissue swelling is noted distally likely related to the recent injury. IMPRESSION: Soft tissue swelling distally consistent with the recent injury. No evidence of acute fracture. Findings suggestive of chronic rotator cuff injury. Electronically Signed   By: Inez Catalina M.D.   On: 05/02/2020 20:52   CT VENOGRAM ABD/PEL  Result Date: 05/03/2020 CLINICAL DATA:  Lower extremity DVT. EXAM: CT VENOGRAM OF THE ABDOMEN AND PELVIS. TECHNIQUE: Multidetector CT imaging of the ABDOMEN AND PELVIS was performed  using the standard protocol during bolus administration of intravenous contrast. Multiplanar CT image reconstructions and MIPs were obtained to evaluate the vascular anatomy. CONTRAST:  OMNIPAQUE IOHEXOL 350 MG/ML SOLN COMPARISON:  None. FINDINGS: VASCULAR Aorta: There are atherosclerotic changes of the abdominal aorta without evidence for an abdominal aortic aneurysm. Celiac: Patent without evidence of aneurysm, dissection, vasculitis or significant stenosis. SMA: Patent without evidence of aneurysm, dissection, vasculitis or significant stenosis. Renals: There are atherosclerotic changes at the origin of both renal arteries without evidence for a high-grade stenosis. IMA: Patent without evidence of aneurysm, dissection, vasculitis or significant stenosis. Veins: There is a partially visualized right common femoral vein DVT. There is no DVT identified within the IVC or iliac vasculature. Review of the MIP images confirms the above findings. NON-VASCULAR Lower chest: There are small bilateral pleural effusions.The heart is enlarged. Hepatobiliary: The liver is normal. Normal gallbladder.There is no biliary ductal dilation. Pancreas: Normal contours without ductal dilatation. No peripancreatic fluid collection. Spleen: Unremarkable.  Adrenals/Urinary Tract: --Adrenal glands: Unremarkable. --Right kidney/ureter: No hydronephrosis or radiopaque kidney stones. --Left kidney/ureter: No hydronephrosis or radiopaque kidney stones. --Urinary bladder: Unremarkable. Stomach/Bowel: --Stomach/Duodenum: No hiatal hernia or other gastric abnormality. Normal duodenal course and caliber. --Small bowel: Unremarkable. --Colon: There is a large amount of stool in the --Appendix: Normal. Lymphatic: --No retroperitoneal lymphadenopathy. --No mesenteric lymphadenopathy. --No pelvic or inguinal lymphadenopathy. Reproductive: Unremarkable Other: No ascites or free air. There is a 5.8 x 5.7 cm hematoma overlying the proximal right hip. There is a partially visualized fluid collection measuring approximately 3.8 cm in the low anterior abdominal wall on the right. Mild anasarca is noted. Musculoskeletal. Advanced multilevel degenerative changes are noted throughout the visualized portions of the thoracolumbar spine. There is no acute displaced fracture. IMPRESSION: 1. There is a partially visualized DVT in the right lower extremity. This DVT appears to be centered within the right profunda femoris and superficial femoral veins. This DVT nearly extends into the right common femoral vein. There is no evidence for proximal thrombosis within the iliac veins or IVC. If the patient is not a candidate for anticoagulation, IVC filter placement should be strongly considered. 2. Small bilateral pleural effusions. 3. Large amount of stool in the visualized portions of the colon. 4. Right hip hematoma again identified. 5. Anasarca. Aortic Atherosclerosis (ICD10-I70.0). These results will be called to the ordering clinician or representative by the Radiologist Assistant, and communication documented in the PACS or [employer]. Electronically Signed   By: Katherine Mantle M.D.   On: 05/03/2020 21:41    Medications:   . ALPRAZolam  0.25 mg Oral TID  .  bisoprolol-hydrochlorothiazide  1 tablet Oral Daily  . cholecalciferol  1,000 Units Oral Daily  . DULoxetine  20 mg Oral Daily  . feeding supplement (ENSURE ENLIVE)  237 mL Oral TID BM  . gabapentin  300 mg Oral QHS  . haloperidol  0.25 mg Oral TID  . insulin aspart  0-15 Units Subcutaneous TID WC  . senna-docusate  1 tablet Oral BID  . sodium chloride flush  3 mL Intravenous Q12H  . vitamin B-12  1,000 mcg Oral Daily   Continuous Infusions: . sodium chloride       LOS: 13 days   Elver Stadler  [facility]     05/04/2020, 10:08 AM

## 2020-05-05 DIAGNOSIS — M79604 Pain in right leg: Secondary | ICD-10-CM

## 2020-05-05 DIAGNOSIS — M79605 Pain in left leg: Secondary | ICD-10-CM

## 2020-05-05 DIAGNOSIS — I824Y1 Acute embolism and thrombosis of unspecified deep veins of right proximal lower extremity: Secondary | ICD-10-CM

## 2020-05-05 DIAGNOSIS — S065X9A Traumatic subdural hemorrhage with loss of consciousness of unspecified duration, initial encounter: Principal | ICD-10-CM

## 2020-05-05 LAB — CBC WITH DIFFERENTIAL/PLATELET
Abs Immature Granulocytes: 0.04 K/uL (ref 0.00–0.07)
Basophils Absolute: 0 K/uL (ref 0.0–0.1)
Basophils Relative: 0 %
Eosinophils Absolute: 0.4 K/uL (ref 0.0–0.5)
Eosinophils Relative: 6 %
HCT: 24.6 % — ABNORMAL LOW (ref 36.0–46.0)
Hemoglobin: 8.2 g/dL — ABNORMAL LOW (ref 12.0–15.0)
Immature Granulocytes: 1 %
Lymphocytes Relative: 13 %
Lymphs Abs: 1 K/uL (ref 0.7–4.0)
MCH: 30 pg (ref 26.0–34.0)
MCHC: 33.3 g/dL (ref 30.0–36.0)
MCV: 90.1 fL (ref 80.0–100.0)
Monocytes Absolute: 0.8 K/uL (ref 0.1–1.0)
Monocytes Relative: 10 %
Neutro Abs: 5.6 K/uL (ref 1.7–7.7)
Neutrophils Relative %: 70 %
Platelets: 225 K/uL (ref 150–400)
RBC: 2.73 MIL/uL — ABNORMAL LOW (ref 3.87–5.11)
RDW: 15.3 % (ref 11.5–15.5)
WBC: 7.9 K/uL (ref 4.0–10.5)
nRBC: 0 % (ref 0.0–0.2)

## 2020-05-05 LAB — COMPREHENSIVE METABOLIC PANEL WITH GFR
ALT: 19 U/L (ref 0–44)
AST: 20 U/L (ref 15–41)
Albumin: 2.3 g/dL — ABNORMAL LOW (ref 3.5–5.0)
Alkaline Phosphatase: 57 U/L (ref 38–126)
Anion gap: 9 (ref 5–15)
BUN: 44 mg/dL — ABNORMAL HIGH (ref 8–23)
CO2: 25 mmol/L (ref 22–32)
Calcium: 9.1 mg/dL (ref 8.9–10.3)
Chloride: 105 mmol/L (ref 98–111)
Creatinine, Ser: 1.06 mg/dL — ABNORMAL HIGH (ref 0.44–1.00)
GFR calc Af Amer: 56 mL/min — ABNORMAL LOW (ref >60)
GFR calc non Af Amer: 48 mL/min — ABNORMAL LOW (ref >60)
Glucose, Bld: 222 mg/dL — ABNORMAL HIGH (ref 70–99)
Potassium: 4 mmol/L (ref 3.5–5.1)
Sodium: 139 mmol/L (ref 135–145)
Total Bilirubin: 0.8 mg/dL (ref 0.3–1.2)
Total Protein: 4.7 g/dL — ABNORMAL LOW (ref 6.5–8.1)

## 2020-05-05 LAB — GLUCOSE, CAPILLARY
Glucose-Capillary: 133 mg/dL — ABNORMAL HIGH (ref 70–99)
Glucose-Capillary: 217 mg/dL — ABNORMAL HIGH (ref 70–99)
Glucose-Capillary: 209 mg/dL — ABNORMAL HIGH (ref 70–99)
Glucose-Capillary: 126 mg/dL — ABNORMAL HIGH (ref 70–99)

## 2020-05-05 LAB — MAGNESIUM: Magnesium: 2.2 mg/dL (ref 1.7–2.4)

## 2020-05-05 LAB — PHOSPHORUS: Phosphorus: 3 mg/dL (ref 2.5–4.6)

## 2020-05-05 MED ORDER — HALOPERIDOL 0.5 MG PO TABS
0.2500 mg | ORAL_TABLET | Freq: Two times a day (BID) | ORAL | Status: DC
  Administered 2020-05-06: 10:00:00 0.25 mg via ORAL
  Filled 2020-05-05 (×7): qty 0.5
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
## 2020-05-05 NOTE — Care Management Important Message (Signed)
Important Message  Patient Details  Name: Sierra Lloyd MRN: [id number] Date of Birth: October 14, 1934   Medicare Important Message Given:  Yes     Olegario Messier A Larya Charpentier 05/05/2020, 11:52 AM

## 2020-05-05 NOTE — Progress Notes (Signed)
PT Cancellation Note  Patient Details Name: Sierra Lloyd MRN: [id number] DOB: 1934-08-19   Cancelled Treatment:    Reason Eval/Treat Not Completed: Patient's level of consciousness (Consult received and chart reviewed.  Re-evaluation attempted.  Patient sleeping soundly; unable to awaken and maintain alertness for participation with re-evaluation.  Will continue efforts at later time/date as patient medically appropriate)   Finnis Colee H. Manson Passey, PT, DPT, NCS 05/05/20, 2:07 PM [phone]

## 2020-05-05 NOTE — TOC Progression Note (Signed)
Transition of Care [facility]) - Progression Note    Patient Details  Name: Sierra Lloyd MRN: [id number] Date of Birth: 13-Sep-1934  Transition of [facility] CM/SW Contact  Araf Clugston, Lemar Livings, LCSW Phone Number: 05/05/2020, 2:53 PM  Clinical Narrative:  Have asked for PT eval and see unable to evaluate due to medical issues and level of alertness. Will await evaluation and then proceed to work on SNF approval at [facility], they have offered a bed and niece wants her close to her.    Expected Discharge Plan: Skilled Nursing Facility Barriers to Discharge: Continued Medical Work up  Expected Discharge Plan and Services Expected Discharge Plan: Skilled Nursing Facility   Discharge Planning Services: CM Consult Post Acute Care Choice: Skilled Nursing Facility Living arrangements for the past 2 months: Single Family Home                                       Social Determinants of Health (SDOH) Interventions    Readmission Risk Interventions No flowsheet data found.

## 2020-05-05 NOTE — Progress Notes (Addendum)
Progress Note    Sierra Lloyd  [name]:[id number] DOB: 1934/01/19  DOA: 04/20/2020 PCP: Margaretann Loveless, PA-C      Brief Narrative:    Medical records reviewed and are as summarized below:  Sierra Lloyd is a 86 y.o. female  with medical history significant fordiabetes mellitus, hypertension and anxiety who presented to the ER for evaluation after a fall. Patient refused to come to the ER and her niece had to take out IVC papers to be able to get her evaluated. Patient noted to have small right frontal subdural hemorrhage, right forehead  hematoma neurosurgeon consulted in the emergency room recommended conservative management and repeat CT head.  Follow-up repeat CT head did not show any worsening of subdural hemorrhage.  She complained of pain and spasms in the left leg.  Venous duplex of bilateral lower extremities was obtained.  She was found to have right profunda femoral DVT.  CT venogram of abdomen and pelvis was obtained and this showed DVT in the right profunda femoris and superficial femoral veins to nearly extends into the right common femoral vein. Hospital stay was prolonged because of delirium and behavioral issues.    Assessment/Plan:   Principal Problem:   Subdural hemorrhage (HCC) Active Problems:   Diabetes (HCC)   Anxiety, generalized   Fall at home, initial encounter   Fall   Acute deep vein thrombosis (DVT) of proximal vein of right lower extremity (HCC)   S/p fall, small right frontal lobe subdural hemorrhage, right scalp soft tissue hematoma: Repeat CT head today (04/22/2020) did not show any worsening of subdural hemorrhage.  Avoid antiplatelets or anticoagulants.  No surgical intervention per neurosurgeon.  Consult palliative care to establish goals of care  Left leg pain/DVT in right lower extremity: Her pain is in the left leg but DVT is in the right lower extremity. s/p IVC filter placement on 05/04/2020.  Follow-up with vascular surgeon.  Analgesics as needed for pain.  Bilateral upper extremity soft tissue swelling: This is likely due to recent fall.  X-ray of the right humerus did not show any acute abnormality.  Acute confusional state/altered mental status/delirium: Etiology unclear. Supportive care.   IVC has been rescinded by psychiatrist.  Hypertension: Continue antihypertensives  Right labium majus mass: cyst vs hematoma as per [place]. Continue w/ supportive care. Recommend outpatient f/u w/ GYN  Anxiety, PTSD: Xanax as needed for anxiety  Blisters on her back: Turn patient every 2 hours.  Mepilex border has been applied to affected areas.  AKI, leukocytosis: Resolved  Body mass index is 21.79 kg/m.   Family Communication/Anticipated D/C date and plan/Code Status   DVT prophylaxis: SCD Code Status: Full code Family Communication: None Disposition Plan:    Status is: Inpatient  Remains inpatient appropriate because:Unsafe d/c plan   Dispo: The patient is from: Home              Anticipated d/c is to: SNF              Anticipated d/c date is: 1 day              Patient currently is medically stable to d/c. Awaiting placement to SNF.           Subjective:   Overnight events noted.  Patient is unable to provide any history.  Objective:    Vitals:   05/05/20 0605 05/05/20 0800 05/05/20 1000 05/05/20 1126  BP: (!) 130/41 (!) 117/37 (!) 116/36 (!) 139/45  Pulse: 60 63 76 78  Resp: 11 13 14 15   Temp:  98.1 F (36.7 C) 97.9 F (36.6 C) 98 F (36.7 C)  TempSrc:  Oral Oral Oral  SpO2: 97% 98% 100% 98%  Weight:      Height:       No data found.  No intake or output data in the 24 hours ending 05/05/20 1443 Filed Weights   04/20/20 2122  Weight: 50.6 kg    Exam:  GEN: NAD SKIN: Swelling on the right forehead, extensive bruising/ecchymosis on the right side of her body including her abdomen, right hip and buttock.  She also has bruises on her chest. She has developed few  blisters on her left upper back. Ruptured blister on left side of sacrum EYES: No pallor or icterus ENT: MMM CV: Regular rate and rhythm PULM: No wheezing or rales heard ABD: soft, ND, NT, +BS CNS: Sleepy but arousable EXT: Soft tissue swelling of bilateral upper extremities Tenderness in the left leg but no swelling or erythema in the lower extremities.   Data Reviewed:   I have personally reviewed following labs and imaging studies:  Labs: Labs show the following:   Basic Metabolic Panel: Recent Labs  Lab 04/29/20 0407 04/29/20 0407 04/30/20 0445 05/03/20 0604  NA 140  --  141 139  K 4.0   < > 3.8 4.3  CL 107  --  108 103  CO2 26  --  27 28  GLUCOSE 213*  --  164* 199*  BUN 35*  --  32* 48*  CREATININE 0.84  --  0.70 0.87  CALCIUM 9.2  --  8.9 9.1   < > = values in this interval not displayed.   GFR Estimated Creatinine Clearance: 34.6 mL/min (by C-G formula based on SCr of 0.87 mg/dL). Liver Function Tests: No results for input(s): AST, ALT, ALKPHOS, BILITOT, PROT, ALBUMIN in the last 168 hours. No results for input(s): LIPASE, AMYLASE in the last 168 hours. No results for input(s): AMMONIA in the last 168 hours. Coagulation profile No results for input(s): INR, PROTIME in the last 168 hours.  CBC: Recent Labs  Lab 04/29/20 0407 04/30/20 0445 05/03/20 0604  WBC 9.7 8.8 11.0*  NEUTROABS  --   --  7.3  HGB 8.1* 7.8* 8.6*  HCT 25.1* 23.5* 27.0*  MCV 93.3 91.8 93.4  PLT 242 216 205   Cardiac Enzymes: No results for input(s): CKTOTAL, CKMB, CKMBINDEX, TROPONINI in the last 168 hours. BNP (last 3 results) No results for input(s): PROBNP in the last 8760 hours. CBG: Recent Labs  Lab 05/04/20 0722 05/04/20 1137 05/04/20 1652 05/05/20 0816 05/05/20 1200  GLUCAP 152* 115* 131* 133* 217*   D-Dimer: No results for input(s): DDIMER in the last 72 hours. Hgb A1c: No results for input(s): HGBA1C in the last 72 hours. Lipid Profile: No results for  input(s): CHOL, HDL, LDLCALC, TRIG, CHOLHDL, LDLDIRECT in the last 72 hours. Thyroid function studies: No results for input(s): TSH, T4TOTAL, T3FREE, THYROIDAB in the last 72 hours.  Invalid input(s): FREET3 Anemia work up: No results for input(s): VITAMINB12, FOLATE, FERRITIN, TIBC, IRON, RETICCTPCT in the last 72 hours. Sepsis Labs: Recent Labs  Lab 04/29/20 0407 04/30/20 0445 05/03/20 0604  WBC 9.7 8.8 11.0*    Microbiology No results found for this or any previous visit (from the past 240 hour(s)).  Procedures and diagnostic studies:  CT VENOGRAM ABD/PEL  Result Date: 05/03/2020 CLINICAL DATA:  Lower extremity DVT. EXAM:  CT VENOGRAM OF THE ABDOMEN AND PELVIS. TECHNIQUE: Multidetector CT imaging of the ABDOMEN AND PELVIS was performed using the standard protocol during bolus administration of intravenous contrast. Multiplanar CT image reconstructions and MIPs were obtained to evaluate the vascular anatomy. CONTRAST:  145mL OMNIPAQUE IOHEXOL 350 MG/ML SOLN COMPARISON:  None. FINDINGS: VASCULAR Aorta: There are atherosclerotic changes of the abdominal aorta without evidence for an abdominal aortic aneurysm. Celiac: Patent without evidence of aneurysm, dissection, vasculitis or significant stenosis. SMA: Patent without evidence of aneurysm, dissection, vasculitis or significant stenosis. Renals: There are atherosclerotic changes at the origin of both renal arteries without evidence for a high-grade stenosis. IMA: Patent without evidence of aneurysm, dissection, vasculitis or significant stenosis. Veins: There is a partially visualized right common femoral vein DVT. There is no DVT identified within the IVC or iliac vasculature. Review of the MIP images confirms the above findings. NON-VASCULAR Lower chest: There are small bilateral pleural effusions.The heart is enlarged. Hepatobiliary: The liver is normal. Normal gallbladder.There is no biliary ductal dilation. Pancreas: Normal contours  without ductal dilatation. No peripancreatic fluid collection. Spleen: Unremarkable. Adrenals/Urinary Tract: --Adrenal glands: Unremarkable. --Right kidney/ureter: No hydronephrosis or radiopaque kidney stones. --Left kidney/ureter: No hydronephrosis or radiopaque kidney stones. --Urinary bladder: Unremarkable. Stomach/Bowel: --Stomach/Duodenum: No hiatal hernia or other gastric abnormality. Normal duodenal course and caliber. --Small bowel: Unremarkable. --Colon: There is a large amount of stool in the --Appendix: Normal. Lymphatic: --No retroperitoneal lymphadenopathy. --No mesenteric lymphadenopathy. --No pelvic or inguinal lymphadenopathy. Reproductive: Unremarkable Other: No ascites or free air. There is a 5.8 x 5.7 cm hematoma overlying the proximal right hip. There is a partially visualized fluid collection measuring approximately 3.8 cm in the low anterior abdominal wall on the right. Mild anasarca is noted. Musculoskeletal. Advanced multilevel degenerative changes are noted throughout the visualized portions of the thoracolumbar spine. There is no acute displaced fracture. IMPRESSION: 1. There is a partially visualized DVT in the right lower extremity. This DVT appears to be centered within the right profunda femoris and superficial femoral veins. This DVT nearly extends into the right common femoral vein. There is no evidence for proximal thrombosis within the iliac veins or IVC. If the patient is not a candidate for anticoagulation, IVC filter placement should be strongly considered. 2. Small bilateral pleural effusions. 3. Large amount of stool in the visualized portions of the colon. 4. Right hip hematoma again identified. 5. Anasarca. Aortic Atherosclerosis (ICD10-I70.0). These results will be called to the ordering clinician or representative by the Radiologist Assistant, and communication documented in the PACS or [employer]. Electronically Signed   By: Constance Holster M.D.   On: 05/03/2020  21:41    Medications:   . ALPRAZolam  0.25 mg Oral TID  . bisoprolol-hydrochlorothiazide  1 tablet Oral Daily  . cholecalciferol  1,000 Units Oral Daily  . DULoxetine  20 mg Oral Daily  . feeding supplement (ENSURE ENLIVE)  237 mL Oral TID BM  . gabapentin  300 mg Oral QHS  . haloperidol  0.25 mg Oral BID  . insulin aspart  0-15 Units Subcutaneous TID WC  . senna-docusate  1 tablet Oral BID  . sodium chloride flush  3 mL Intravenous Q12H  . vitamin B-12  1,000 mcg Oral Daily   Continuous Infusions: . sodium chloride       LOS: 14 days   Joreen Swearingin  [facility]     05/05/2020, 2:43 PM

## 2020-05-06 DIAGNOSIS — Z7189 Other specified counseling: Secondary | ICD-10-CM

## 2020-05-06 DIAGNOSIS — Z515 Encounter for palliative care: Secondary | ICD-10-CM

## 2020-05-06 LAB — GLUCOSE, CAPILLARY
Glucose-Capillary: 141 mg/dL — ABNORMAL HIGH (ref 70–99)
Glucose-Capillary: 190 mg/dL — ABNORMAL HIGH (ref 70–99)
Glucose-Capillary: 185 mg/dL — ABNORMAL HIGH (ref 70–99)
Glucose-Capillary: 218 mg/dL — ABNORMAL HIGH (ref 70–99)

## 2020-05-06 MED ORDER — LACTATED RINGERS IV SOLN: INTRAVENOUS | Status: DC

## 2020-05-06 MED ORDER — CYCLOBENZAPRINE HCL 10 MG PO TABS
5.0000 mg | ORAL_TABLET | Freq: Every day | ORAL | Status: DC
  Administered 2020-05-06 – 2020-05-08 (×3): 5 mg via ORAL
  Filled 2020-05-06 (×3): qty 1

## 2020-05-06 NOTE — Evaluation (Signed)
Physical Therapy Evaluation Patient Details Name: Sierra Lloyd MRN: [id number] DOB: 04-15-1934 Today's Date: 05/06/2020   History of Present Illness  presented to ER s/p fall with injury to R head, hip; admitted for management of R frontal SDH (55mm, stable).  R hip with significant ecchymosis and hematoma; xray, CT negative for acute bony injury; R shoulder pain appreciated, imaging negative for acute injury (consistent with chronic RTC).  Hospital course additionally significant for R LE DVT, s/p IVC filter placement (05/04/20).  Clinical Impression  Patient awake upon arrival to room, visually acknowledges and tracks therapist throughout room.  Oriented to self, location as hospital; does follow approx 75% simple verbal commands with increased time for processing and task initiation.  Globally weak and deconditioned throughout all extremities: R UE/LE grossly 2-/5 throughout, suspected inattention with RUE noted resting in position of extension and IR (no active attempts to utilize throughout session); L UE/LE grossly 3-/5, mild/mod associated reactions noted with movement of isolated extremities.  Continues to report significant pain to R hip (improved with distractional conversation); appears to have some significant tone increase/muscle spasms contributing to pain throughout session.  Currently requiring max assist for rolling in bed; max/total assist +2 for supine/sit; mod assist for unsupported sitting balance.  Does demonstrates mod sway in all planes, but does attempt to self-correct approx 25-50% time with cuing from therapist.  OOB and gait deferred this session, due to emphasis on sitting balance/tolerance.  Will continue to assess/progress in subsequent sessions as appropriate. Would benefit from skilled PT to address above deficits and promote optimal return to PLOF.; recommend transition to STR upon discharge from acute hospitalization.     Follow Up Recommendations SNF    Equipment  Recommendations       Recommendations for Other Services       Precautions / Restrictions Precautions Precautions: Fall Restrictions Weight Bearing Restrictions: No      Mobility  Bed Mobility Overal bed mobility: Needs Assistance Bed Mobility: Supine to Sit;Sit to Supine Rolling: Max assist   Supine to sit: Max assist;Total assist;+2 for physical assistance Sit to supine: Max assist;Total assist;+2 for physical assistance   General bed mobility comments: poor dissociation of trunk and extremities, very guarded/fearful posturing  Transfers                 General transfer comment: deferred this session due to emphasis on sitting balance  Ambulation/Gait             General Gait Details: deferred this session due to emphasis on sitting balance  Stairs            Wheelchair Mobility    Modified Rankin (Stroke Patients Only)       Balance Overall balance assessment: Needs assistance Sitting-balance support: No upper extremity supported;Feet supported Sitting balance-Leahy Scale: Poor Sitting balance - Comments: mod assist to maintain unsupported sitting balance Postural control: Posterior lean;Left lateral lean                                   Pertinent Vitals/Pain Pain Assessment: Faces Faces Pain Scale: Hurts even more Pain Location: R LE Pain Descriptors / Indicators: Aching;Guarding;Grimacing Pain Intervention(s): Limited activity within patient's tolerance;Monitored during session;Repositioned;Patient requesting pain meds-RN notified;RN gave pain meds during session    Home Living Family/patient expects to be discharged to:: Skilled nursing facility Living Arrangements: Spouse/significant other  Additional Comments: Per chart, patient living alone prior to admission.  Patient poor historian; unable to provide information otherwise, family not available at this time    Prior Function            Comments: Per chart, patient living alone prior to admission.  Patient poor historian; unable to provide information otherwise, family not available at this time     Hand Dominance   Dominant Hand: Right    Extremity/Trunk Assessment   Upper Extremity Assessment Upper Extremity Assessment:  (R UE grossly 2-/5 throughout, generalized edema, mild inattention, significant tone (2-3/4) with resting position of extension/internal rotation; L UE grossly 3-/5 throughout (mod associated reactions with L LE movement))    Lower Extremity Assessment Lower Extremity Assessment:  (R LE grossly 2- to 3-/5, limited by pain (improved with distractional conversation), appears with mild/mod spasm to extremity throughout session; L LE grossly 3-/5 throughout)       Communication   Communication: No difficulties  Cognition Arousal/Alertness: Awake/alert Behavior During Therapy: Flat affect                                   General Comments: oriented to self, location as hospital; follows approx 75% simple commands with increased time/cuing for attention to task      General Comments      Exercises Other Exercises Other Exercises: Rolling bilat, max assist +1-2, for hygiene and peri-care; does attempt to reach with L UE for assist as able.  Limited active use of R hemi-body Other Exercises: Oral care, light grooming in supported long-sitting, max/dep assist with hand-over-hand to grasp and manipulate toothbrush in L hand. Other Exercises: Unsupported sitting, worked on awareness of midline and development of self-righting reactions, min/mod assist.  Demonstrates mild/mod sway in all planes, does attempt to self-correct 25% time with cuing from therapist.  Good effort to follow commands and participate wtih activity.   Assessment/Plan    PT Assessment Patient needs continued PT services  PT Problem List Decreased strength;Decreased range of motion;Decreased activity  tolerance;Decreased balance;Decreased mobility;Decreased cognition;Decreased knowledge of use of DME;Decreased safety awareness;Decreased knowledge of precautions;Pain;Decreased skin integrity       PT Treatment Interventions DME instruction;Gait training;Functional mobility training;Therapeutic activities;Stair training;Therapeutic exercise;Balance training;Neuromuscular re-education;Cognitive remediation;Patient/family education    PT Goals (Current goals can be found in the Care Plan section)  Acute Rehab PT Goals Patient Stated Goal: to get some pain medicine (requested/issed per RN during session) PT Goal Formulation: With patient Time For Goal Achievement: 05/20/20 Potential to Achieve Goals: Fair    Frequency Min 2X/week   Barriers to discharge Decreased caregiver support      Co-evaluation               AM-PAC PT "6 Clicks" Mobility  Outcome Measure Help needed turning from your back to your side while in a flat bed without using bedrails?: Total Help needed moving from lying on your back to sitting on the side of a flat bed without using bedrails?: Total Help needed moving to and from a bed to a chair (including a wheelchair)?: Total Help needed standing up from a chair using your arms (e.g., wheelchair or bedside chair)?: Total Help needed to walk in hospital room?: Total Help needed climbing 3-5 steps with a railing? : Total 6 Click Score: 6    End of Session   Activity Tolerance: Patient tolerated treatment well Patient left: in bed;with  call bell/phone within reach;with bed alarm set Nurse Communication: Mobility status PT Visit Diagnosis: Difficulty in walking, not elsewhere classified (R26.2);Pain;History of falling (Z91.81);Muscle weakness (generalized) (M62.81) Pain - Right/Left: Right Pain - part of body: Hip    Time: 4401-0272 PT Time Calculation (min) (ACUTE ONLY): 53 min   Charges:   PT Evaluation $PT Re-evaluation: 1 Re-eval PT  Treatments $Therapeutic Activity: 23-37 mins $Neuromuscular Re-education: 8-22 mins        Dallin Mccorkel H. Manson Passey, PT, DPT, NCS 05/06/20, 1:52 PM [phone]

## 2020-05-06 NOTE — Consult Note (Signed)
Consultation Note Date: 05/06/2020   Patient Name: Sierra Lloyd  DOB: 28-Aug-1934  MRN: [id number]  Age / Sex: 86 y.o., female  PCP: Mar Daring, PA-C Referring Physician: Jennye Boroughs, MD  Reason for Consultation: Establishing goals of care  HPI/Patient Profile: 86 y.o. female  with past medical history of DM, HTN, and anxiety admitted on 04/20/2020 with a fall. Found to have small frontal subdural hemorrhage. Neurosurgery recommended conservative management. Patient also found to have R leg DVT.  IVC filter was placed 6/13. Patient has fluctuated between somnolence and agitation throughout hospitalization. PMT consulted for [place].  Clinical Assessment and Goals of Care: I have reviewed medical records including EPIC notes, labs and imaging, received report from RN and Dr. Mal Misty, assessed the patient and then met with patient to discuss diagnosis prognosis, GOC, EOL wishes, disposition and options.  Attempted to discuss situation with patient as she appears more alert today than is documented in previous days. However patient was too confused to discuss goals of care - she continuously shifted conversation to leg pain and then would quickly fall asleep.  Discussed pain management with nursing - careful balance with controlling pain and not sedating patient. Patient requested heating pad.   I returned to room later and patient's cousin Sierra Lloyd was at the bedside - Sierra Lloyd tells me she does not serve as Media planner for patient - her niece, Sierra Lloyd serves as Media planner.   Sierra Lloyd was able to provide information about patient's baseline - she lives alone. Sierra Lloyd and Sierra Lloyd helped patient with bathing. Patient was ambulatory, but slow. Frequent falls. Sierra Lloyd reports patient had a good appetite and no cognitive impairment prior to current hospitalization.   I attempted to call patient's niece, Sierra Lloyd to further discuss goals of care however she did not  answer and voicemail was full.  I provided Sierra Lloyd with my phone number and asked her to pass on to [name] to return my call.   Sierra Lloyd does share that family continues to be interested in SNF placement.  Primary Decision Maker NEXT OF KIN - niece Sierra Lloyd has been serving as Media planner as patient is currently unable  SUMMARY OF RECOMMENDATIONS   - continued attempted to discuss [place] with patient's niece, so far have been unable to make contact - suggest outpatient palliative follow at SNF - **discharging MD please write for outpatient palliative in discharge summary** - will follow up tomorrow  Code Status/Advance Care Planning:  Full code - has not been discussed with family  Discharge Planning: [place] for rehab with Palliative care service follow-up      Primary Diagnoses: Present on Admission: . Anxiety, generalized . Subdural hemorrhage ([place]) . Fall   I have reviewed the medical record, interviewed the patient and family, and examined the patient. The following aspects are pertinent.  Past Medical History:  Diagnosis Date  . Allergy   . Anxiety   . Arthritis   . Diabetes mellitus without complication ([place])   . Hypertension    Social History   Socioeconomic History  . Marital status: Single    Spouse name: Not on file  . Number of children: Not on file  . Years of education: Not on file  . Highest education level: Not on file  Occupational History  . Not on file  Tobacco Use  . Smoking status: Never Smoker  . Smokeless tobacco: Never Used  Vaping Use  . Vaping Use: Never used  Substance and Sexual Activity  . Alcohol  use: Yes    Comment: occasional  . Drug use: No  . Sexual activity: Not on file  Other Topics Concern  . Not on file  Social History Narrative  . Not on file   Social Determinants of Health   Financial Resource Strain:   . Difficulty of Paying Living Expenses:   Food Insecurity:   . Worried About Charity fundraiser in  the Last Year:   . Arboriculturist in the Last Year:   Transportation Needs:   . Film/video editor (Medical):   Marland Kitchen Lack of Transportation (Non-Medical):   Physical Activity:   . Days of Exercise per Week:   . Minutes of Exercise per Session:   Stress:   . Feeling of Stress :   Social Connections:   . Frequency of Communication with Friends and Family:   . Frequency of Social Gatherings with Friends and Family:   . Attends Religious Services:   . Active Member of Clubs or Organizations:   . Attends Archivist Meetings:   Marland Kitchen Marital Status:    Family History  Problem Relation Age of Onset  . Asthma Mother   . Anxiety disorder Mother   . Lung disease Mother   . COPD Sister    Scheduled Meds: . ALPRAZolam  0.25 mg Oral TID  . bisoprolol-hydrochlorothiazide  1 tablet Oral Daily  . cholecalciferol  1,000 Units Oral Daily  . DULoxetine  20 mg Oral Daily  . feeding supplement (ENSURE ENLIVE)  237 mL Oral TID BM  . gabapentin  300 mg Oral QHS  . haloperidol  0.25 mg Oral BID  . insulin aspart  0-15 Units Subcutaneous TID WC  . senna-docusate  1 tablet Oral BID  . sodium chloride flush  3 mL Intravenous Q12H  . vitamin B-12  1,000 mcg Oral Daily   Continuous Infusions: . sodium chloride    . lactated ringers 50 mL/hr at 05/06/20 1050   PRN Meds:.sodium chloride, acetaminophen, hydrALAZINE, LORazepam, morphine injection, ondansetron **OR** ondansetron (ZOFRAN) IV, oxyCODONE, sodium chloride, sodium chloride flush Allergies  Allergen Reactions  . Tetracycline     Roof of mouth broke out.   Review of Systems  Musculoskeletal:       C/o right leg pain and spasms    Physical Exam Constitutional:      Comments: lethargic  Pulmonary:     Effort: Pulmonary effort is normal.  Skin:    General: Skin is warm and dry.  Neurological:     Comments: Oriented with periods of confusion     Vital Signs: BP 138/67 (BP Location: Left Arm)   Pulse (!) 55   Temp 97.7  F (36.5 C) (Oral)   Resp 18   Ht 5' (1.524 m)   Wt 50.6 kg   SpO2 100%   BMI 21.79 kg/m  Pain Scale: PAINAD POSS *See Group Information*: S-Acceptable,Sleep, easy to arouse Pain Score: Asleep   SpO2: SpO2: 100 % O2 Device:SpO2: 100 % O2 Flow Rate: .O2 Flow Rate (L/min): 2 L/min  IO: Intake/output summary:   Intake/Output Summary (Last 24 hours) at 05/06/2020 1358 Last data filed at 05/05/2020 1700 Gross per 24 hour  Intake --  Output 550 ml  Net -550 ml    LBM: Last BM Date: 05/03/20 (per chart) Baseline Weight: Weight: 50.6 kg Most recent weight: Weight: 50.6 kg     Palliative Assessment/Data: PPS 40%    Time Total: 50 minutes Greater than 50%  of  this time was spent counseling and coordinating care related to the above assessment and plan.  Juel Burrow, DNP, AGNP-C Palliative Medicine Team [phone] Pager: [phone]
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
## 2020-05-06 NOTE — Progress Notes (Signed)
   05/06/20 1537  Assess: MEWS Score  Temp 98.1 F (36.7 C)  BP (!) 119/52  Pulse Rate (!) 52  Resp 14  SpO2 98 %  O2 Device Room Air  Assess: MEWS Score  MEWS Temp 0  MEWS Systolic 0  MEWS Pulse 0  MEWS RR 0  MEWS LOC 1  MEWS Score 1  MEWS Score Color Green  Assess: if the MEWS score is Yellow or Red  Were vital signs taken at a resting state? Yes  Focused Assessment Documented focused assessment  Early Detection of Sepsis Score *See Row Information* Low  MEWS guidelines implemented *See Row Information* No, altered LOC is baseline

## 2020-05-06 NOTE — TOC Progression Note (Signed)
Transition of Care [facility]) - Progression Note    Patient Details  Name: Sierra Lloyd MRN: [id number] Date of Birth: 18-Aug-1934  Transition of [facility]) CM/SW Contact  Mysti Haley, Lemar Livings, LCSW Phone Number: 05/06/2020, 2:38 PM  Clinical Narrative:   Spoke with [name]-niece to discuss discharge needs. Discussed bed offers from [facility] and [facility] and she really wants her local. Discussed with not being vaccinated she will need to quarantine for 14 days. Niece feels this will really make her more anxious and have more anxiety. This worker will check with [facility] per niece's request. Discussed other option to take pt home with caregivers. She will require 24 hr care. Have contacted [facility] to see if can offer bed and work with niece on best option for pt. Await return call from [facility]    Expected Discharge Plan: Skilled Nursing Facility Barriers to Discharge: Continued Medical Work up  Expected Discharge Plan and Services Expected Discharge Plan: Skilled Nursing Facility   Discharge Planning Services: CM Consult Post Acute Care Choice: Skilled Nursing Facility Living arrangements for the past 2 months: Single Family Home                                       Social Determinants of Health (SDOH) Interventions    Readmission Risk Interventions No flowsheet data found.

## 2020-05-06 NOTE — Plan of Care (Signed)

## 2020-05-06 NOTE — Progress Notes (Signed)
Progress Note    SHEA SWALLEY  QQP:[id number] DOB: 30-May-1934  DOA: 04/20/2020 PCP: Mar Daring, PA-C      Brief Narrative:    Medical records reviewed and are as summarized below:  Sierra Lloyd is a 86 y.o. female  with medical history significant fordiabetes mellitus, hypertension and anxiety who presented to the ER for evaluation after a fall. Patient refused to come to the ER and her niece had to take out IVC papers to be able to get her evaluated. Patient noted to have small right frontal subdural hemorrhage, right forehead  hematoma neurosurgeon consulted in the emergency room recommended conservative management and repeat CT head.  Follow-up repeat CT head did not show any worsening of subdural hemorrhage.  She complained of pain and spasms in the left leg.  Venous duplex of bilateral lower extremities was obtained.  She was found to have right profunda femoral DVT.  CT venogram of abdomen and pelvis was obtained and this showed DVT in the right profunda femoris and superficial femoral veins that nearly extends into the right common femoral vein.  Vascular surgery was consulted and she underwent IVC filter placement on 05/04/2020. Hospital stay was prolonged because of delirium and behavioral issues.  Dilated by palliative care team who recommended outpatient follow-up with the palliative care team at discharge.    Assessment/Plan:   Principal Problem:   Subdural hemorrhage (HCC) Active Problems:   Diabetes ([place])   Anxiety, generalized   Fall at home, initial encounter   Fall   Acute deep vein thrombosis (DVT) of proximal vein of right lower extremity (HCC)   Goals of care, counseling/discussion   Palliative care by specialist   S/p fall, small right frontal lobe subdural hemorrhage, right scalp soft tissue hematoma: Repeat CT head today (04/22/2020) did not show any worsening of subdural hemorrhage.  Avoid antiplatelets or anticoagulants.  No surgical  intervention per neurosurgeon.  Consult palliative care to establish goals of care  B/l leg pain, left leg spasms, DVT in right lower extremity:  s/p IVC filter placement on 05/04/2020.  Follow-up with vascular surgeon as outpatient in 8 weeks to discuss IVC filter removal. Analgesics as needed for pain.  Start Flexeril for leg spasms.  Bilateral upper extremity soft tissue swelling: This is likely due to recent fall.  X-ray of the right humerus did not show any acute abnormality.  Acute confusional state/altered mental status/delirium: Etiology unclear. Supportive care.   IVC has been rescinded by psychiatrist.  Hypertension: Continue antihypertensives  Right labium majus mass: cyst vs hematoma as per [place]. Continue w/ supportive care. Recommend outpatient f/u w/ GYN  Anxiety, PTSD: Xanax as needed for anxiety  Blisters on her back: Turn patient every 2 hours.  Mepilex border has been applied to affected areas.  Poor oral intake: Patient has been started on IV fluids because of increasing creatinine and dark urine.  Patient previously had AKI that improved but creatinine is starting to trend up.  Follow-up BMP  leukocytosis: Resolved  Body mass index is 21.79 kg/m.   Family Communication/Anticipated D/C date and plan/Code Status   DVT prophylaxis: SCD Code Status: Full code Family Communication: None Disposition Plan:    Status is: Inpatient  Remains inpatient appropriate because:Unsafe d/c plan   Dispo: The patient is from: Home              Anticipated d/c is to: SNF  Anticipated d/c date is: 1 day              Patient currently is medically stable to d/c. Awaiting placement to SNF.           Subjective:   Overnight events noted.  Patient is unable to provide any history.  Objective:    Vitals:   05/06/20 0410 05/06/20 0741 05/06/20 1213 05/06/20 1537  BP: (!) 140/52 (!) 144/57 138/67 (!) 119/52  Pulse: 63 (!) 54 (!) 55 (!) 52  Resp: 20 18  18 14   Temp: 98.7 F (37.1 C) (!) 97.5 F (36.4 C) 97.7 F (36.5 C) 98.1 F (36.7 C)  TempSrc:  Axillary Oral   SpO2: 96% 100% 100% 98%  Weight:      Height:       No data found.  No intake or output data in the 24 hours ending 05/06/20 1700 Filed Weights   04/20/20 2122  Weight: 50.6 kg    Exam:  GEN: NAD SKIN: Swelling on the right forehead, extensive bruising/ecchymosis on the right side of her body including her abdomen, right hip and buttock.  She also has bruises on her chest. She has developed few blisters on her left upper back. Ruptured blister on left side of sacrum EYES: No pallor or icterus ENT: MMM CV: Regular rate and rhythm PULM: No wheezing or rales heard ABD: soft, ND, NT, +BS CNS: Alert, oriented x3. She's able to read the time on the clock EXT: Soft tissue swelling of bilateral upper extremities. Mild swelling of b/l legs   Data Reviewed:   I have personally reviewed following labs and imaging studies:  Labs: Labs show the following:   Basic Metabolic Panel: Recent Labs  Lab 04/30/20 0445 04/30/20 0445 05/03/20 0604 05/05/20 1525  NA 141  --  139 139  K 3.8   < > 4.3 4.0  CL 108  --  103 105  CO2 27  --  28 25  GLUCOSE 164*  --  199* 222*  BUN 32*  --  48* 44*  CREATININE 0.70  --  0.87 1.06*  CALCIUM 8.9  --  9.1 9.1  MG  --   --   --  2.2  PHOS  --   --   --  3.0   < > = values in this interval not displayed.   GFR Estimated Creatinine Clearance: 28.4 mL/min (A) (by C-G formula based on SCr of 1.06 mg/dL (H)). Liver Function Tests: Recent Labs  Lab 05/05/20 1525  AST 20  ALT 19  ALKPHOS 57  BILITOT 0.8  PROT 4.7*  ALBUMIN 2.3*   No results for input(s): LIPASE, AMYLASE in the last 168 hours. No results for input(s): AMMONIA in the last 168 hours. Coagulation profile No results for input(s): INR, PROTIME in the last 168 hours.  CBC: Recent Labs  Lab 04/30/20 0445 05/03/20 0604 05/05/20 1525  WBC 8.8 11.0* 7.9    NEUTROABS  --  7.3 5.6  HGB 7.8* 8.6* 8.2*  HCT 23.5* 27.0* 24.6*  MCV 91.8 93.4 90.1  PLT 216 205 225   Cardiac Enzymes: No results for input(s): CKTOTAL, CKMB, CKMBINDEX, TROPONINI in the last 168 hours. BNP (last 3 results) No results for input(s): PROBNP in the last 8760 hours. CBG: Recent Labs  Lab 05/05/20 1736 05/05/20 2130 05/06/20 0738 05/06/20 1213 05/06/20 1535  GLUCAP 209* 126* 141* 190* 185*   D-Dimer: No results for input(s): DDIMER in the last 72  hours. Hgb A1c: No results for input(s): HGBA1C in the last 72 hours. Lipid Profile: No results for input(s): CHOL, HDL, LDLCALC, TRIG, CHOLHDL, LDLDIRECT in the last 72 hours. Thyroid function studies: No results for input(s): TSH, T4TOTAL, T3FREE, THYROIDAB in the last 72 hours.  Invalid input(s): FREET3 Anemia work up: No results for input(s): VITAMINB12, FOLATE, FERRITIN, TIBC, IRON, RETICCTPCT in the last 72 hours. Sepsis Labs: Recent Labs  Lab 04/30/20 0445 05/03/20 0604 05/05/20 1525  WBC 8.8 11.0* 7.9    Microbiology No results found for this or any previous visit (from the past 240 hour(s)).  Procedures and diagnostic studies:  No results found.  Medications:   . ALPRAZolam  0.25 mg Oral TID  . bisoprolol-hydrochlorothiazide  1 tablet Oral Daily  . cholecalciferol  1,000 Units Oral Daily  . cyclobenzaprine  5 mg Oral QHS  . DULoxetine  20 mg Oral Daily  . feeding supplement (ENSURE ENLIVE)  237 mL Oral TID BM  . gabapentin  300 mg Oral QHS  . haloperidol  0.25 mg Oral BID  . insulin aspart  0-15 Units Subcutaneous TID WC  . senna-docusate  1 tablet Oral BID  . sodium chloride flush  3 mL Intravenous Q12H  . vitamin B-12  1,000 mcg Oral Daily   Continuous Infusions: . sodium chloride    . lactated ringers 50 mL/hr at 05/06/20 1050     LOS: 15 days   Kaoir Loree  [facility]     05/06/2020, 5:00 PM

## 2020-05-07 LAB — GLUCOSE, CAPILLARY
Glucose-Capillary: 152 mg/dL — ABNORMAL HIGH (ref 70–99)
Glucose-Capillary: 230 mg/dL — ABNORMAL HIGH (ref 70–99)
Glucose-Capillary: 74 mg/dL (ref 70–99)
Glucose-Capillary: 188 mg/dL — ABNORMAL HIGH (ref 70–99)

## 2020-05-07 NOTE — TOC Progression Note (Addendum)
Transition of Care [facility]) - Progression Note    Patient Details  Name: Sierra Lloyd MRN: [id number] Date of Birth: 1934-01-09  Transition of Care [facility]) CM/SW Contact  Latausha Flamm, Lemar Livings, LCSW Phone Number: 05/07/2020, 9:19 AM  Clinical Narrative:   Awaiting [facility] to call back regarding ability to offer a bed. Niece aware of need for decision regarding SNF versus HO[facility]. Pt will require 24 hr care if she goes home  11:25 AM Contacted [facility] to check again she will get back with this worker once looks at  12:24 Thayer Ohm from [facility] here to evaluate pt for their facility  2:30 Pm Chris to call niece-[name] and then get back with worker to let know if can offer a bed. Thayer Ohm wanting to talk to niece to discuss if becomes long term care.   3:53 PM Spoke with [name] who will call [facility] at [facility]. Camelia Eng does not feel pt is medically ready to go to a facility and has not decided if going to a SNF or home. But she does not realize how much care pt requires. Have asked MD to call her and go over medical issues and answer her questions. He will follow up with niece    Expected Discharge Plan: Skilled Nursing Facility Barriers to Discharge: Continued Medical Work up  Expected Discharge Plan and Services Expected Discharge Plan: Skilled Nursing Facility   Discharge Planning Services: CM Consult Post Acute Care Choice: Skilled Nursing Facility Living arrangements for the past 2 months: Single Family Home                                       Social Determinants of Health (SDOH) Interventions    Readmission Risk Interventions No flowsheet data found.

## 2020-05-07 NOTE — Evaluation (Signed)
Occupational Therapy Evaluation Patient Details Name: Sierra Lloyd MRN: [id number] DOB: Jan 15, 1934 Today's Date: 05/07/2020    History of Present Illness presented to ER s/p fall with injury to R head, hip; admitted for management of R frontal SDH (42mm, stable).  R hip with significant ecchymosis and hematoma; xray, CT negative for acute bony injury; R shoulder pain appreciated, imaging negative for acute injury (consistent with chronic RTC).  Hospital course additionally significant for R LE DVT, s/p IVC filter placement (05/04/20).   Clinical Impression   Pt was seen for OT evaluation this date. Prior to hospital admission, pt reports being Indep with all aspects of ADLs and fxl mobility. Pt apparently lives alone per her report. Currently pt demonstrates impairments as described below (See OT problem list) which functionally limit her ability to perform ADL/self-care tasks. Pt currently requires MIN to MOD A with bed level UB ADLs d/t UE weakness, TOTAL A for LB ADLs at bed level, and does not tolerate attempts to transition to EOB sitting or lateral rolling this session 2/2 pain.  Pt would benefit from skilled OT to address noted impairments and functional limitations (see below for any additional details) in order to maximize safety and independence while minimizing falls risk and caregiver burden. Upon hospital discharge, recommend STR to maximize pt safety and return to PLOF.     Follow Up Recommendations  SNF    Equipment Recommendations  Other (comment) (defer to next venue of care)    Recommendations for Other Services       Precautions / Restrictions Precautions Precautions: Fall Restrictions Weight Bearing Restrictions: No      Mobility Bed Mobility               General bed mobility comments: attempted to roll in bed on OT assessment to no avail d/t pt unable to tolerate, c/o significant pain.  Transfers                 General transfer comment: NT     Balance       Sitting balance - Comments: NT on OT eval d/t pain       Standing balance comment: NT                           ADL either performed or assessed with clinical judgement   ADL Overall ADL's : Needs assistance/impaired Eating/Feeding: Minimal assistance;Moderate assistance;Bed level Eating/Feeding Details (indicate cue type and reason): with HOB elevated Grooming: Wash/dry face;Moderate assistance;Oral care;Maximal assistance Grooming Details (indicate cue type and reason): with HOB elevated, with MIN cues to sequence. Pt requires assist d/t gross weakness of UEs, only functionally uses L UE to complete task.         Upper Body Dressing : Moderate assistance;Bed level   Lower Body Dressing: Total assistance;Bed level Lower Body Dressing Details (indicate cue type and reason): barely able to tolerate simulating task at bed level d/t R LE pain   Toilet Transfer Details (indicate cue type and reason): NT, pt with c/o significant pain with any attempts to touch R LE. Not appropriate to attempt to mobilize at this time, also with poor attn to cueing (potentially 2/2 distraction r/t pain)                 Vision Baseline Vision/History: Wears glasses Wears Glasses: At all times Patient Visual Report: No change from baseline       Perception  Praxis      Pertinent Vitals/Pain Pain Assessment: Faces Faces Pain Scale: Hurts whole lot Pain Location: R LE Pain Descriptors / Indicators: Aching;Guarding;Grimacing Pain Intervention(s): Limited activity within patient's tolerance;Monitored during session;Patient requesting pain meds-RN notified;RN gave pain meds during session;Repositioned     Hand Dominance Right   Extremity/Trunk Assessment Upper Extremity Assessment Upper Extremity Assessment: RUE deficits/detail;LUE deficits/detail RUE Deficits / Details: R UE with different presentation than yesterday (in reading PT note), presents with gross  weakness, no posturing/hypertonicity detected. R UE is grossly weak, pt is unable to lift I'ly. Rests with UE fully extended (elevated on pillow d/t edema). Tolerates PROM shld flexion to 1/2 arc of motion, elbow flexion to 3/4 arc of motion, digits flexion to 3/4 arc of motion. Grossly 1+/5 shld/elbow MMT,  2/5 grip MMT LUE Deficits / Details: Grossly weak, AROM: shld flexion 1/3 arc of motion, elbow flexion 3/4 arc of motion, supination 1/2 arc of motion, grip WFL, MMT of grip 3/5   Lower Extremity Assessment Lower Extremity Assessment: Generalized weakness;Defer to PT evaluation (limited assessment of R LE d/t pt reporting pain with any touching of R LE at all. Pain meds requested. L LE grossly 3-/5 t/o)       Communication Communication Communication: No difficulties   Cognition Arousal/Alertness: Awake/alert Behavior During Therapy: Flat affect Overall Cognitive Status: No family/caregiver present to determine baseline cognitive functioning                                 General Comments: Pt is oriented to month/year (not day or date) and oriented to self & "hospital" but does not correctly name hosptial or describe situation. Pt follows ~75% of simple one-step commands accurately. MIN/MOD attn to task t/o session (distracted by pain and generally decreased fxl task attn).   General Comments       Exercises Other Exercises Other Exercises: OT facilitates pt education re: role of OT in acute setting and recommendations for moving UEs while hospitalized to maintain/reduce loss of strength in UEs as pt is already demonstrating fxl decline versus her reported baseline. Pt is moderately receptive to ed.   Shoulder Instructions      Home Living Family/patient expects to be discharged to:: Skilled nursing facility Living Arrangements: Spouse/significant other                               Additional Comments: Per chart, patient living alone prior to admission.   Patient poor historian; unable to provide information otherwise, family not available at this time      Prior Functioning/Environment          Comments: Pt is somewhat poor historian (improved orientation this date, but poor attn to task and conflicting accounts of PLOF/home setup). Pt does endorse being Indep with all ADLs/IADLs prior which is conducive to living alone as reported in chart.        OT Problem List: Decreased strength;Decreased range of motion;Decreased activity tolerance;Impaired balance (sitting and/or standing);Decreased knowledge of use of DME or AE;Decreased knowledge of precautions;Impaired UE functional use;Impaired tone;Pain;Increased edema      OT Treatment/Interventions: Self-care/ADL training;Therapeutic exercise;Energy conservation;DME and/or AE instruction;Therapeutic activities;Balance training;Patient/family education;Manual therapy    OT Goals(Current goals can be found in the care plan section) Acute Rehab OT Goals Patient Stated Goal: to get pain under control (requested and RN notified-RN issued at end  of session) OT Goal Formulation: With patient Time For Goal Achievement: 05/21/20 Potential to Achieve Goals: Good  OT Frequency: Min 2X/week   Barriers to D/C: Decreased caregiver support          Co-evaluation              AM-PAC OT "6 Clicks" Daily Activity     Outcome Measure Help from another person eating meals?: A Little Help from another person taking care of personal grooming?: A Lot Help from another person toileting, which includes using toliet, bedpan, or urinal?: Total Help from another person bathing (including washing, rinsing, drying)?: A Lot Help from another person to put on and taking off regular upper body clothing?: A Lot Help from another person to put on and taking off regular lower body clothing?: Total 6 Click Score: 11   End of Session Nurse Communication: Mobility status  Activity Tolerance: Patient limited  by pain Patient left: in bed;with call bell/phone within reach;with bed alarm set;with nursing/sitter in room (nurse presenting with pain meds)  OT Visit Diagnosis: Unsteadiness on feet (R26.81);Muscle weakness (generalized) (M62.81);History of falling (Z91.81)                Time: 5993-5701 OT Time Calculation (min): 32 min Charges:  OT General Charges $OT Visit: 1 Visit OT Evaluation $OT Eval Moderate Complexity: 1 Mod OT Treatments $Self Care/Home Management : 8-22 mins  Rejeana Brock, MS, OTR/L ascom [phone] 05/07/20, 11:56 AM

## 2020-05-07 NOTE — Progress Notes (Signed)
Patient ID: Sierra Lloyd, female   DOB: [birth year], 86 y.o.   MRN: [id number]  PROGRESS NOTE    Sierra Lloyd  YTK:[id number] DOB: [birth year] DOA: 04/20/2020 PCP: Mar Daring, PA-C   Brief Narrative:  86 year old female with history of diabetes mellitus type 2, hypertension and anxiety presented for evaluation after a fall.  Patient refused to come to the ER and her niece had to get IVC papers to be able to get her evaluated.  She was found to have small right frontal subdural hemorrhage, right forehead hematoma.  Neurosurgery recommended conservative management and see repeat CT head.  Follow-up repeat CT head did not show any worsening of subdural hemorrhage.  During the hospitalization, she was found to have right profound femoris DVT.  CT venogram of abdomen and pelvis showed DVT in the right profunda femoris and superficial femoral veins that nearly extended into the right common vein.  Vascular surgery was consulted and she underwent IVC filter placement on 05/04/2020.  Hospital stay has been prolonged because of delirium and behavioral issue.  Palliative care team is also following.  PT recommended SNF placement.  Assessment & Plan:   Small right frontal lobe subdural hemorrhage with right scalp soft tissue hematoma secondary to fall -Repeat CT on 04/22/2020 did not show any worsening of subdural hemorrhage.  Avoid antiplatelets or anticoagulants -No surgical intervention per neurosurgery -Fall precautions  Right lower extremity DVT -Status post IVC filter placement on 05/04/2020 by vascular surgery.  Follow-up with vascular surgeon as an outpatient in 8 weeks to discuss IVC filter removal -Continue analgesics as needed for pain.  Bilateral upper extremity soft tissue swelling -Most likely from fall.  X-ray of the right humerus did not show any acute abnormality  Acute confusional state/altered mental status/delirium -Etiology unclear.  Supportive care.  IVC has been rescinded  by psychiatrist  Essential hypertension -Monitor blood pressure.  Continue bisoprolol-hydrochlorothiazide  Right labia mass -Cyst versus hematoma as per ultrasound.  Continue with supportive care.  Recommend outpatient follow-up with GYN  Anxiety, PTSD -Continue Xanax as needed for anxiety along with haloperidol  Generalized deconditioning Poor oral intake -Patient has been started on IV fluids because of poor oral intake and creeping up of creatinine.  Repeat a.m. creatinine. -Overall prognosis is guarded to poor.  Palliative care following.  Remains full code.   DVT prophylaxis: SCDs Code Status: Full Family Communication: None at bedside Disposition Plan: Status is: Inpatient  Remains inpatient appropriate because:Altered mental status.  Awaiting SNF placement   Dispo: The patient is from: Home              Anticipated d/c is to: SNF              Anticipated d/c date is: 1 day              Patient currently is medically stable to d/c.   Consultants: Neurosurgery/palliative care  Procedures: None  Antimicrobials: None   Subjective: Patient seen and examined at bedside.  She is awake, slightly confused, very poor historian, very slow to respond to questions.  No overnight fever or vomiting reported.  Oral intake is still very poor. Objective: Vitals:   05/06/20 2328 05/07/20 0306 05/07/20 0843 05/07/20 1134  BP: (!) 160/55 (!) 153/56 (!) 131/54 (!) 141/51  Pulse: (!) 52 (!) 52 (!) 56 60  Resp: 14 14 12 14   Temp: 98.6 F (37 C) 98 F (36.7 C) 98.3 F (36.8 C) 98.6 F (37 C)  TempSrc: Oral Oral Oral Oral  SpO2: 99% 97% 99% 99%  Weight:      Height:        Intake/Output Summary (Last 24 hours) at 05/07/2020 1312 Last data filed at 05/07/2020 0525 Gross per 24 hour  Intake 927.77 ml  Output --  Net 927.77 ml   Filed Weights   04/20/20 2122  Weight: 50.6 kg    Examination:  General exam: Appears calm and comfortable.  Awake but confused, slow to  respond.  Respiratory system: Bilateral decreased breath sounds at bases with some scattered crackles Cardiovascular system: S1 & S2 heard, intermittently bradycardic  gastrointestinal system: Abdomen is nondistended, soft and nontender. Normal bowel sounds heard. Extremities: No cyanosis, clubbing; bilateral lower extremity trace edema present    Data Reviewed: I have personally reviewed following labs and imaging studies  CBC: Recent Labs  Lab 05/03/20 0604 05/05/20 1525  WBC 11.0* 7.9  NEUTROABS 7.3 5.6  HGB 8.6* 8.2*  HCT 27.0* 24.6*  MCV 93.4 90.1  PLT 205 225   Basic Metabolic Panel: Recent Labs  Lab 05/03/20 0604 05/05/20 1525  NA 139 139  K 4.3 4.0  CL 103 105  CO2 28 25  GLUCOSE 199* 222*  BUN 48* 44*  CREATININE 0.87 1.06*  CALCIUM 9.1 9.1  MG  --  2.2  PHOS  --  3.0   GFR: Estimated Creatinine Clearance: 28.4 mL/min (A) (by C-G formula based on SCr of 1.06 mg/dL (H)). Liver Function Tests: Recent Labs  Lab 05/05/20 1525  AST 20  ALT 19  ALKPHOS 57  BILITOT 0.8  PROT 4.7*  ALBUMIN 2.3*   No results for input(s): LIPASE, AMYLASE in the last 168 hours. No results for input(s): AMMONIA in the last 168 hours. Coagulation Profile: No results for input(s): INR, PROTIME in the last 168 hours. Cardiac Enzymes: No results for input(s): CKTOTAL, CKMB, CKMBINDEX, TROPONINI in the last 168 hours. BNP (last 3 results) No results for input(s): PROBNP in the last 8760 hours. HbA1C: No results for input(s): HGBA1C in the last 72 hours. CBG: Recent Labs  Lab 05/06/20 1213 05/06/20 1535 05/06/20 2140 05/07/20 0842 05/07/20 1133  GLUCAP 190* 185* 218* 152* 230*   Lipid Profile: No results for input(s): CHOL, HDL, LDLCALC, TRIG, CHOLHDL, LDLDIRECT in the last 72 hours. Thyroid Function Tests: No results for input(s): TSH, T4TOTAL, FREET4, T3FREE, THYROIDAB in the last 72 hours. Anemia Panel: No results for input(s): VITAMINB12, FOLATE, FERRITIN, TIBC,  IRON, RETICCTPCT in the last 72 hours. Sepsis Labs: No results for input(s): PROCALCITON, LATICACIDVEN in the last 168 hours.  No results found for this or any previous visit (from the past 240 hour(s)).       Radiology Studies: No results found.      Scheduled Meds: . ALPRAZolam  0.25 mg Oral TID  . bisoprolol-hydrochlorothiazide  1 tablet Oral Daily  . cholecalciferol  1,000 Units Oral Daily  . cyclobenzaprine  5 mg Oral QHS  . DULoxetine  20 mg Oral Daily  . feeding supplement (ENSURE ENLIVE)  237 mL Oral TID BM  . gabapentin  300 mg Oral QHS  . haloperidol  0.25 mg Oral BID  . insulin aspart  0-15 Units Subcutaneous TID WC  . senna-docusate  1 tablet Oral BID  . sodium chloride flush  3 mL Intravenous Q12H  . vitamin B-12  1,000 mcg Oral Daily   Continuous Infusions: . sodium chloride    . lactated ringers 50 mL/hr at 05/07/20  9024          Glade Lloyd, MD [facility] 05/07/2020, 1:12 PM

## 2020-05-08 DIAGNOSIS — M79605 Pain in left leg: Secondary | ICD-10-CM

## 2020-05-08 LAB — BASIC METABOLIC PANEL
Anion gap: 8 (ref 5–15)
BUN: 30 mg/dL — ABNORMAL HIGH (ref 8–23)
CO2: 27 mmol/L (ref 22–32)
Calcium: 8.9 mg/dL (ref 8.9–10.3)
Chloride: 102 mmol/L (ref 98–111)
Creatinine, Ser: 0.79 mg/dL (ref 0.44–1.00)
GFR calc Af Amer: >60 mL/min (ref >60)
GFR calc non Af Amer: >60 mL/min (ref >60)
Glucose, Bld: 152 mg/dL — ABNORMAL HIGH (ref 70–99)
Potassium: 4.1 mmol/L (ref 3.5–5.1)
Sodium: 137 mmol/L (ref 135–145)

## 2020-05-08 LAB — CBC WITH DIFFERENTIAL/PLATELET
Abs Immature Granulocytes: 0.03 10*3/uL (ref 0.00–0.07)
Basophils Absolute: 0 10*3/uL (ref 0.0–0.1)
Basophils Relative: 0 %
Eosinophils Absolute: 0.8 10*3/uL — ABNORMAL HIGH (ref 0.0–0.5)
Eosinophils Relative: 8 %
HCT: 29.6 % — ABNORMAL LOW (ref 36.0–46.0)
Hemoglobin: 9.4 g/dL — ABNORMAL LOW (ref 12.0–15.0)
Immature Granulocytes: 0 %
Lymphocytes Relative: 14 %
Lymphs Abs: 1.4 10*3/uL (ref 0.7–4.0)
MCH: 29.6 pg (ref 26.0–34.0)
MCHC: 31.8 g/dL (ref 30.0–36.0)
MCV: 93.1 fL (ref 80.0–100.0)
Monocytes Absolute: 0.9 10*3/uL (ref 0.1–1.0)
Monocytes Relative: 9 %
Neutro Abs: 6.9 10*3/uL (ref 1.7–7.7)
Neutrophils Relative %: 69 %
Platelets: 227 10*3/uL (ref 150–400)
RBC: 3.18 MIL/uL — ABNORMAL LOW (ref 3.87–5.11)
RDW: 14.6 % (ref 11.5–15.5)
WBC: 10.1 10*3/uL (ref 4.0–10.5)
nRBC: 0 % (ref 0.0–0.2)

## 2020-05-08 LAB — GLUCOSE, CAPILLARY
Glucose-Capillary: 152 mg/dL — ABNORMAL HIGH (ref 70–99)
Glucose-Capillary: 158 mg/dL — ABNORMAL HIGH (ref 70–99)
Glucose-Capillary: 114 mg/dL — ABNORMAL HIGH (ref 70–99)
Glucose-Capillary: 196 mg/dL — ABNORMAL HIGH (ref 70–99)
Glucose-Capillary: 224 mg/dL — ABNORMAL HIGH (ref 70–99)

## 2020-05-08 LAB — MAGNESIUM: Magnesium: 2 mg/dL (ref 1.7–2.4)

## 2020-05-08 MED ORDER — ACETAMINOPHEN 500 MG PO TABS
1000.0000 mg | ORAL_TABLET | Freq: Three times a day (TID) | ORAL | Status: DC
  Administered 2020-05-08 – 2020-05-09 (×3): 1000 mg via ORAL
  Filled 2020-05-08 (×3): qty 2

## 2020-05-08 MED ORDER — FLEET ENEMA 7-19 GM/118ML RE ENEM
1.0000 | ENEMA | Freq: Once | RECTAL | Status: AC
  Administered 2020-05-08: 08:00:00 1 via RECTAL

## 2020-05-08 MED ORDER — ALPRAZOLAM 0.5 MG PO TABS: 0.2500 mg | ORAL_TABLET | Freq: Two times a day (BID) | ORAL | Status: DC | PRN

## 2020-05-08 MED ORDER — OCUVITE-LUTEIN PO CAPS
1.0000 | ORAL_CAPSULE | Freq: Every day | ORAL | Status: DC
  Administered 2020-05-09: 09:00:00 1 via ORAL
  Filled 2020-05-08 (×2): qty 1

## 2020-05-08 NOTE — TOC Progression Note (Signed)
Transition of Care [facility]) - Progression Note    Patient Details  Name: Sierra Lloyd MRN: [id number] Date of Birth: 1934/06/14  Transition of Care [facility]) CM/SW Contact  Kunio Cummiskey, Lemar Livings, LCSW Phone Number: 05/08/2020, 9:08 AM  Clinical Narrative:   Spoke with [name]-niece this am she did talk with [facility] and reports they have beds, no official offer has been made. [name] wants to talk with MD regarding aunt's medical issues and has questions. She is aware of the 14 day quarantine at a facility. She feels it would take time to set up plan for home so plan still is SNF. Have reached out to [facility] to see if can offer bed and then will work on insurance auth. Will transfer case back to [place] and [name] aware of this.    Expected Discharge Plan: Skilled Nursing Facility Barriers to Discharge: Continued Medical Work up  Expected Discharge Plan and Services Expected Discharge Plan: Skilled Nursing Facility   Discharge Planning Services: CM Consult Post Acute Care Choice: Skilled Nursing Facility Living arrangements for the past 2 months: Single Family Home                                       Social Determinants of Health (SDOH) Interventions    Readmission Risk Interventions No flowsheet data found.

## 2020-05-08 NOTE — TOC Progression Note (Signed)
Transition of Care [facility]) - Progression Note    Patient Details  Name: Sierra Lloyd MRN: [id number] Date of Birth: 1934-02-23  Transition of Care [facility]) CM/SW Contact  Allayne Butcher, RN Phone Number: 05/08/2020, 3:19 PM  Clinical Narrative:    Thayer Ohm with [facility] is going to meet with the patient and the patient's niece [name] this afternoon at 1630.  RNCM will follow up with [facility] and [name] after meeting.    Expected Discharge Plan: Skilled Nursing Facility Barriers to Discharge: Continued Medical Work up  Expected Discharge Plan and Services Expected Discharge Plan: Skilled Nursing Facility   Discharge Planning Services: CM Consult Post Acute Care Choice: Skilled Nursing Facility Living arrangements for the past 2 months: Single Family Home                                       Social Determinants of Health (SDOH) Interventions    Readmission Risk Interventions No flowsheet data found.

## 2020-05-08 NOTE — Progress Notes (Signed)
Physical Therapy Treatment Patient Details Name: Sierra Lloyd MRN: [id number] DOB: 06-Jun-1934 Today's Date: 05/08/2020    History of Present Illness presented to ER s/p fall with injury to R head, hip; admitted for management of R frontal SDH (19mm, stable).  R hip with significant ecchymosis and hematoma; xray, CT negative for acute bony injury; R shoulder pain appreciated, imaging negative for acute injury (consistent with chronic RTC).  Hospital course additionally significant for R LE DVT, s/p IVC filter placement (05/04/20).    PT Comments    Pre-medicated prior to session.  Participated in exercises as described below.  Pt with minimal participation and keeps eyes closed mostly during session but does answer as appropriate.  She is in agreement to try sitting EOB and does do better than last session.  +1 mod/max to EOB with with effort from pt to assist.  Once sitting, she is able to sit x 8 minutes with min a x 1 about 70% of the time to remain upright.  Post/L lean at times with verbal and tactile cues to correct.  Pt happy to be sitting and is more engaged once up.  She is fatigued with effort and does c/o some dizziness which prompted returning to supine.  Cleared once supine.    May consider orthostatic CP's next session.     Follow Up Recommendations  SNF     Equipment Recommendations       Recommendations for Other Services       Precautions / Restrictions Precautions Precautions: Fall    Mobility  Bed Mobility Overal bed mobility: Needs Assistance Bed Mobility: Supine to Sit;Sit to Supine     Supine to sit: Mod assist Sit to supine: Max assist      Transfers                 General transfer comment: NT  Ambulation/Gait             General Gait Details: deferred this session due to emphasis on sitting balance   Stairs             Wheelchair Mobility    Modified Rankin (Stroke Patients Only)       Balance Overall balance  assessment: Needs assistance Sitting-balance support: No upper extremity supported;Feet supported Sitting balance-Leahy Scale: Poor Sitting balance - Comments: needs +1 assist about 70% of the time to remain upright.  Post to left lean, Sat 8 minutes today.                                    Cognition Arousal/Alertness: Awake/alert Behavior During Therapy: WFL for tasks assessed/performed Overall Cognitive Status: Within Functional Limits for tasks assessed                                        Exercises Other Exercises Other Exercises: supine AAOM x 10 for BLE ankle pumps, SLR, ab/add and heel slides. Other Exercises: sat EOB x 8 minutes with min a x 1 about 70% of the time to remain upright.    General Comments        Pertinent Vitals/Pain Pain Assessment: Faces Faces Pain Scale: Hurts little more Pain Location: R LE Pain Descriptors / Indicators: Aching;Guarding;Grimacing Pain Intervention(s): Limited activity within patient's tolerance;Premedicated before session    Home Living  Prior Function            PT Goals (current goals can now be found in the care plan section) Progress towards PT goals: Progressing toward goals    Frequency    Min 2X/week      PT Plan Current plan remains appropriate    Co-evaluation              AM-PAC PT "6 Clicks" Mobility   Outcome Measure  Help needed turning from your back to your side while in a flat bed without using bedrails?: Total Help needed moving from lying on your back to sitting on the side of a flat bed without using bedrails?: Total Help needed moving to and from a bed to a chair (including a wheelchair)?: Total Help needed standing up from a chair using your arms (e.g., wheelchair or bedside chair)?: Total Help needed to walk in hospital room?: Total Help needed climbing 3-5 steps with a railing? : Total 6 Click Score: 6    End of Session    Activity Tolerance: Patient tolerated treatment well;Patient limited by fatigue Patient left: in bed;with call bell/phone within reach;with bed alarm set Nurse Communication: Mobility status Pain - Right/Left: Right Pain - part of body: Hip     Time: 2725-3664 PT Time Calculation (min) (ACUTE ONLY): 25 min  Charges:  $Therapeutic Exercise: 8-22 mins $Therapeutic Activity: 8-22 mins                    Danielle Dess, PTA 05/08/20, 1:29 PM

## 2020-05-08 NOTE — Care Management Important Message (Signed)
Important Message  Patient Details  Name: Sierra Lloyd MRN: [id number] Date of Birth: [birth year]   Medicare Important Message Given:  Yes     Olegario Messier A Ellanore Vanhook 05/08/2020, 11:02 AM

## 2020-05-08 NOTE — Progress Notes (Signed)
Initial Nutrition Assessment  DOCUMENTATION CODES:   Not applicable  INTERVENTION:  Magic cup BID with lunch and dinner meals, each supplement provides 290 kcal and 9 grams of protein  Ocuvite po daily, MVI provides 200 mg vit C, 40 mg zinc, 55 mcg selenium, 2 mg copper, 2 mg lutein   Will order weight to assess trends  Continue Ensure TID  Continue feeding support and supervision with meals per SLP recommendations   NUTRITION DIAGNOSIS:   Inadequate oral intake related to lethargy/confusion (per chart fluctuating between somnolence and agitation throughout hospitalization) as evidenced by energy intake < 75% for > 7 days.    GOAL:   Patient will meet greater than or equal to 90% of their needs   MONITOR:   PO intake, Weight trends, Supplement acceptance, Skin, I & O's, Labs  REASON FOR ASSESSMENT:   LOS    ASSESSMENT:  RD working remotely.   86 year old female admitted for subdural hemorrhage after a fall at home with medical history significant for DM2, HTN, anxiety.  5/31 - Admit  Per notes, patient refused to come to ER, niece had to get IVC papers to be able to get her evaluated. Neurosurgery recommended conservative management, repeat head CT did not show worsening of subdural hemorrhage. During hospitalization, pt found to have right profunda femoris DVT and is s/p IVC filter placement on 6/13. Noted delirium and behavioral issues prolonging hospital stay. Plans for d/c to SNF pending bed offers.  At baseline, pt is ambulatory, but slow and has a history of frequent falls, family reported good appetite and no cognitive impairments prior to current hospitalization. Patient with varied po intake, eating 20-100% (66% average) of the last 8 documented intakes from 6/10-6/17. Per medication review, patient is receiving Ensure supplement TID and has consumed 57% from 6/4-6/17. Will provide Magic Cup with lunch and dinner meals as well as daily MVI to aid with meeting  needs.   Mild pitting generalized; BLE, non-pitting BUE edema per RN assessment.  No new weights this admission Per chart pt weighed 111.32 on 04/20/20, stable 114-122 lb from 2019-2020. Recommend obtaining current wt as able to assess trends.   Medications reviewed and include: VitD, Flexeril, Cymbalta, Gabapentin, SSI, Senokot, B12 Labs: CBGs [phone] Lab Results  Component Value Date   HGBA1C 7.0 (H) 04/20/2020    NUTRITION - FOCUSED PHYSICAL EXAM: Unable to complete at this time, RD working remotely.  Diet Order:   Diet Order            DIET - DYS 1 Room service appropriate? Yes; Fluid consistency: Thin  Diet effective now                 EDUCATION NEEDS:   Not appropriate for education at this time  Skin:  Skin Assessment: Skin Integrity Issues: Skin Integrity Issues:: Other (Comment) Other: Non-pressure wound;mid buttocks; Blister;buttocks,perineum,back; Ecchymosis;R arm;leg;head;face  Last BM:  6/11  Height:   Ht Readings from Last 1 Encounters:  04/20/20 5' (1.524 m)    Weight:   Wt Readings from Last 1 Encounters:  04/20/20 50.6 kg    Ideal Body Weight:  45.5 kg  BMI:  Body mass index is 21.79 kg/m.  Estimated Nutritional Needs:   Kcal:  1300-1500  Protein:  65-75  Fluid:  >/= 1.2 L/day   Lars Masson, RD, LDN Clinical Nutrition After Hours/Weekend Pager # in Amion

## 2020-05-08 NOTE — Progress Notes (Signed)
[facility] Room 109 [facility]) Hospital Liaison RN note  New referral for outpatient palliative care to be followed by [facility] at discharge.  Will follow for disposition.  Please call with any questions or concerns.  Thank you, Haynes Bast, BSN, RN [facility] Liaison [phone]
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
## 2020-05-08 NOTE — Plan of Care (Signed)
  Problem: Clinical Measurements: Goal: Respiratory complications will improve Outcome: Progressing   Problem: Clinical Measurements: Goal: Cardiovascular complication will be avoided Outcome: Progressing   Problem: Elimination: Goal: Will not experience complications related to bowel motility Outcome: Progressing   Problem: Elimination: Goal: Will not experience complications related to urinary retention Outcome: Progressing   Problem: Education: Goal: Knowledge of General Education information will improve Description: Including pain rating scale, medication(s)/side effects and non-pharmacologic comfort measures Outcome: Adequate for Discharge   Problem: Health Behavior/Discharge Planning: Goal: Ability to manage health-related needs will improve Outcome: Adequate for Discharge   Problem: Clinical Measurements: Goal: Ability to maintain clinical measurements within normal limits will improve Outcome: Adequate for Discharge   Problem: Clinical Measurements: Goal: Diagnostic test results will improve Outcome: Adequate for Discharge   Problem: Activity: Goal: Risk for activity intolerance will decrease Outcome: Adequate for Discharge   Problem: Coping: Goal: Level of anxiety will decrease Outcome: Adequate for Discharge   Problem: Pain Managment: Goal: General experience of comfort will improve Outcome: Adequate for Discharge   Problem: Safety: Goal: Ability to remain free from injury will improve Outcome: Adequate for Discharge

## 2020-05-08 NOTE — Consult Note (Signed)
WOC Nurse Consult Note: Reason for Consult:moisture associated skin damage to bilateral buttocks and perineal area.  Partial thickness breakdown.  Wound type:MASD from incontinence Pressure Injury POA: NA Measurement: scattered 0.1 cm blistering to bilateral skin folds and perineal skin Wound BDZ:HGDJ and moist Drainage (amount, consistency, odor) scant weeping Periwound:blanchable erythema Dressing procedure/placement/frequency: Cleanse buttocks and perineal skin with soap and water and pat dry.  Apply barrier cream twice daily and PRN soilage.  External urinary manager in place. No disposable briefs or underpads.  Will not follow at this time.  Please re-consult if needed.  Maple Hudson MSN, RN, FNP-BC CWON Wound, Ostomy, Continence Nurse Pager [phone]

## 2020-05-08 NOTE — Progress Notes (Signed)
Patient ID: Sierra Lloyd, female   DOB: 07/18/1934, 86 y.o.   MRN: [id number]  PROGRESS NOTE    LUCILLIE KIESEL  HYI:[id number] DOB: 1934-03-29 DOA: 04/20/2020 PCP: Margaretann Loveless, PA-C   Brief Narrative:  86 year old female with history of diabetes mellitus type 2, hypertension and anxiety presented for evaluation after a fall.  Patient refused to come to the ER and her niece had to get IVC papers to be able to get her evaluated.  She was found to have small right frontal subdural hemorrhage, right forehead hematoma.  Neurosurgery recommended conservative management and see repeat CT head.  Follow-up repeat CT head did not show any worsening of subdural hemorrhage.  During the hospitalization, she was found to have right profound femoris DVT.  CT venogram of abdomen and pelvis showed DVT in the right profunda femoris and superficial femoral veins that nearly extended into the right common vein.  Vascular surgery was consulted and she underwent IVC filter placement on 05/04/2020.  Hospital stay has been prolonged because of delirium and behavioral issue.  Palliative care team is also following.  PT recommended SNF placement.  Assessment & Plan:   Small right frontal lobe subdural hemorrhage with right scalp soft tissue hematoma secondary to fall -Repeat CT on 04/22/2020 did not show any worsening of subdural hemorrhage.  Avoid antiplatelets or anticoagulants -No surgical intervention per neurosurgery -Fall precautions  Right lower extremity DVT -Status post IVC filter placement on 05/04/2020 by vascular surgery.  Follow-up with vascular surgeon as an outpatient in 8 weeks to discuss IVC filter removal -Continue analgesics as needed for pain.  Bilateral upper extremity soft tissue swelling -Most likely from fall.  X-ray of the right humerus did not show any acute abnormality  Acute confusional state/altered mental status/delirium -Etiology unclear.  Supportive care.  IVC has been rescinded  by psychiatrist  Essential hypertension -Monitor blood pressure.  Continue bisoprolol-hydrochlorothiazide  Right labia mass -Cyst versus hematoma as per ultrasound.  Continue with supportive care.  Recommend outpatient follow-up with GYN/general surgery.  Patient's niece/[name] is aware of this and is okay with outpatient follow-up  Anxiety, PTSD -Had a detailed discussion with niece/[name] on phone on 05/08/2020 and she is worried about the use of scheduled Xanax and Haldol.  Haldol is a new medication for this patient.  Patient has apparently refused Haldol for the last 2 days: Will stop Haldol.  Change Xanax to 0.25 mg - 0.5 mg 3 times daily as needed anxiety.  Continue Cymbalta  Generalized deconditioning Poor oral intake -Patient was started on IV fluids because of poor oral intake and creeping up of creatinine.  Creatinine improved today.  Will DC IV fluids.   -Overall prognosis is guarded to poor.  Palliative care following.  Remains full code.  Recommend that patient be changed to DNR status.  Niece undecided at this time. -Patient's niece is still deciding on SNF versus home.   DVT prophylaxis: SCDs Code Status: Full Family Communication: Spoke to niece on 05/08/2020 Disposition Plan: Status is: Inpatient  Remains inpatient appropriate because:Altered mental status.  Awaiting SNF placement   Dispo: The patient is from: Home              Anticipated d/c is to: SNF              Anticipated d/c date is: 1 day              Patient currently is medically stable to d/c.   Consultants: Neurosurgery/palliative  care  Procedures: None  Antimicrobials: None   Subjective: Patient seen and examined at bedside.  Very poor historian.  Looks anxious and intermittently screaming in pain.  No overnight vomiting, fever or seizures reported.   Objective: Vitals:   05/07/20 1134 05/07/20 1605 05/08/20 0034 05/08/20 0642  BP: (!) 141/51 (!) 117/46 (!) 149/55 130/68  Pulse: 60 (!) 52 62  78  Resp: 14 14 16 16   Temp: 98.6 F (37 C) 98.6 F (37 C) 98.7 F (37.1 C) 98.6 F (37 C)  TempSrc: Oral Axillary  Oral  SpO2: 99% 97% 99%   Weight:      Height:       No intake or output data in the 24 hours ending 05/08/20 0739 Filed Weights   04/20/20 2122  Weight: 50.6 kg    Examination:  General exam:  Awake but confused, slow to respond.  Intermittently screams and asks for pain medications Respiratory system: Bilateral decreased breath sounds at bases with no wheezing cardiovascular system: S1 & S2 heard; mildly bradycardic intermittently  gastrointestinal system: Abdomen is nondistended, soft and nontender.  Bowel sounds heard  extremities: No clubbing; bilateral lower extremity trace edema present    Data Reviewed: I have personally reviewed following labs and imaging studies  CBC: Recent Labs  Lab 05/03/20 0604 05/05/20 1525 05/08/20 0528  WBC 11.0* 7.9 10.1  NEUTROABS 7.3 5.6 6.9  HGB 8.6* 8.2* 9.4*  HCT 27.0* 24.6* 29.6*  MCV 93.4 90.1 93.1  PLT 205 225 812   Basic Metabolic Panel: Recent Labs  Lab 05/03/20 0604 05/05/20 1525 05/08/20 0528  NA 139 139 137  K 4.3 4.0 4.1  CL 103 105 102  CO2 28 25 27   GLUCOSE 199* 222* 152*  BUN 48* 44* 30*  CREATININE 0.87 1.06* 0.79  CALCIUM 9.1 9.1 8.9  MG  --  2.2 2.0  PHOS  --  3.0  --    GFR: Estimated Creatinine Clearance: 37.6 mL/min (by C-G formula based on SCr of 0.79 mg/dL). Liver Function Tests: Recent Labs  Lab 05/05/20 1525  AST 20  ALT 19  ALKPHOS 57  BILITOT 0.8  PROT 4.7*  ALBUMIN 2.3*   No results for input(s): LIPASE, AMYLASE in the last 168 hours. No results for input(s): AMMONIA in the last 168 hours. Coagulation Profile: No results for input(s): INR, PROTIME in the last 168 hours. Cardiac Enzymes: No results for input(s): CKTOTAL, CKMB, CKMBINDEX, TROPONINI in the last 168 hours. BNP (last 3 results) No results for input(s): PROBNP in the last 8760 hours. HbA1C: No  results for input(s): HGBA1C in the last 72 hours. CBG: Recent Labs  Lab 05/06/20 2140 05/07/20 0842 05/07/20 1133 05/07/20 1603 05/07/20 2024  GLUCAP 218* 152* 230* 74 188*   Lipid Profile: No results for input(s): CHOL, HDL, LDLCALC, TRIG, CHOLHDL, LDLDIRECT in the last 72 hours. Thyroid Function Tests: No results for input(s): TSH, T4TOTAL, FREET4, T3FREE, THYROIDAB in the last 72 hours. Anemia Panel: No results for input(s): VITAMINB12, FOLATE, FERRITIN, TIBC, IRON, RETICCTPCT in the last 72 hours. Sepsis Labs: No results for input(s): PROCALCITON, LATICACIDVEN in the last 168 hours.  No results found for this or any previous visit (from the past 240 hour(s)).       Radiology Studies: No results found.      Scheduled Meds: . ALPRAZolam  0.25 mg Oral TID  . bisoprolol-hydrochlorothiazide  1 tablet Oral Daily  . cholecalciferol  1,000 Units Oral Daily  . cyclobenzaprine  5 mg Oral QHS  . DULoxetine  20 mg Oral Daily  . feeding supplement (ENSURE ENLIVE)  237 mL Oral TID BM  . gabapentin  300 mg Oral QHS  . haloperidol  0.25 mg Oral BID  . insulin aspart  0-15 Units Subcutaneous TID WC  . senna-docusate  1 tablet Oral BID  . sodium chloride flush  3 mL Intravenous Q12H  . vitamin B-12  1,000 mcg Oral Daily   Continuous Infusions: . sodium chloride    . lactated ringers 50 mL/hr at 05/07/20 2209          Glade Lloyd, MD [facility] 05/08/2020, 7:39 AM

## 2020-05-09 LAB — BASIC METABOLIC PANEL
Anion gap: 6 (ref 5–15)
BUN: 28 mg/dL — ABNORMAL HIGH (ref 8–23)
CO2: 28 mmol/L (ref 22–32)
Calcium: 8.7 mg/dL — ABNORMAL LOW (ref 8.9–10.3)
Chloride: 102 mmol/L (ref 98–111)
Creatinine, Ser: 0.84 mg/dL (ref 0.44–1.00)
GFR calc Af Amer: >60 mL/min (ref >60)
GFR calc non Af Amer: >60 mL/min (ref >60)
Glucose, Bld: 191 mg/dL — ABNORMAL HIGH (ref 70–99)
Potassium: 4.2 mmol/L (ref 3.5–5.1)
Sodium: 136 mmol/L (ref 135–145)

## 2020-05-09 LAB — MAGNESIUM: Magnesium: 2.1 mg/dL (ref 1.7–2.4)

## 2020-05-09 LAB — CBC WITH DIFFERENTIAL/PLATELET
Abs Immature Granulocytes: 0.03 10*3/uL (ref 0.00–0.07)
Basophils Absolute: 0 10*3/uL (ref 0.0–0.1)
Basophils Relative: 0 %
Eosinophils Absolute: 0.7 10*3/uL — ABNORMAL HIGH (ref 0.0–0.5)
Eosinophils Relative: 7 %
HCT: 28.2 % — ABNORMAL LOW (ref 36.0–46.0)
Hemoglobin: 9 g/dL — ABNORMAL LOW (ref 12.0–15.0)
Immature Granulocytes: 0 %
Lymphocytes Relative: 11 %
Lymphs Abs: 1.1 10*3/uL (ref 0.7–4.0)
MCH: 29.8 pg (ref 26.0–34.0)
MCHC: 31.9 g/dL (ref 30.0–36.0)
MCV: 93.4 fL (ref 80.0–100.0)
Monocytes Absolute: 0.7 10*3/uL (ref 0.1–1.0)
Monocytes Relative: 7 %
Neutro Abs: 7.2 10*3/uL (ref 1.7–7.7)
Neutrophils Relative %: 75 %
Platelets: 239 10*3/uL (ref 150–400)
RBC: 3.02 MIL/uL — ABNORMAL LOW (ref 3.87–5.11)
RDW: 14.6 % (ref 11.5–15.5)
WBC: 9.7 10*3/uL (ref 4.0–10.5)
nRBC: 0 % (ref 0.0–0.2)

## 2020-05-09 LAB — GLUCOSE, CAPILLARY
Glucose-Capillary: 155 mg/dL — ABNORMAL HIGH (ref 70–99)
Glucose-Capillary: 127 mg/dL — ABNORMAL HIGH (ref 70–99)
Glucose-Capillary: 110 mg/dL — ABNORMAL HIGH (ref 70–99)

## 2020-05-09 LAB — SARS CORONAVIRUS 2 BY RT PCR (HOSPITAL ORDER, PERFORMED IN ~~LOC~~ HOSPITAL LAB): SARS Coronavirus 2: NEGATIVE

## 2020-05-09 MED ORDER — CYCLOBENZAPRINE HCL 5 MG PO TABS: 5.0000 mg | ORAL_TABLET | Freq: Every day | ORAL | 0 refills | Status: AC

## 2020-05-09 MED ORDER — ACETAMINOPHEN 500 MG PO TABS: 1000.0000 mg | ORAL_TABLET | Freq: Three times a day (TID) | ORAL | 0 refills | Status: AC

## 2020-05-09 MED ORDER — DULOXETINE HCL 20 MG PO CPEP: 20.0000 mg | ORAL_CAPSULE | Freq: Every day | ORAL | 0 refills | Status: AC

## 2020-05-09 MED ORDER — SENNOSIDES-DOCUSATE SODIUM 8.6-50 MG PO TABS: 1.0000 | ORAL_TABLET | Freq: Two times a day (BID) | ORAL | 0 refills | Status: AC

## 2020-05-09 MED ORDER — GABAPENTIN 300 MG PO CAPS: 300.0000 mg | ORAL_CAPSULE | Freq: Two times a day (BID) | ORAL | 0 refills | Status: AC

## 2020-05-09 MED ORDER — ALPRAZOLAM 0.5 MG PO TABS: 0.2500 mg | ORAL_TABLET | Freq: Two times a day (BID) | ORAL | 0 refills | Status: AC | PRN

## 2020-05-09 MED ORDER — OXYCODONE HCL 5 MG PO TABS: 5.0000 mg | ORAL_TABLET | Freq: Three times a day (TID) | ORAL | 0 refills | Status: AC | PRN

## 2020-05-09 NOTE — Progress Notes (Signed)
Patient still sleeping . Breathing is even and unlabored. This writer continues to makes purposeful rounding to monitor safety and comfort for pt.

## 2020-05-09 NOTE — TOC Progression Note (Addendum)
Transition of Care [facility]) - Progression Note    Patient Details  Name: Sierra Lloyd MRN: [id number] Date of Birth: 14-Apr-1934  Transition of Care [facility]) CM/SW Contact  Eilleen Kempf, LCSW Phone Number: 05/09/2020, 9:11 AM  Clinical Narrative:    CSW spoke with Tammy[facility] to follow-up concerning bed offer. Tammy confirmed patient has bed offer with [facility], but need authorization first.  10:00am 6/18: CSW initiated authorization with Melba Coon, Ref# [id number] requested expedition  PASRR#  [id number] F    Expected Discharge Plan: Skilled Nursing Facility Barriers to Discharge: Continued Medical Work up  Expected Discharge Plan and Services Expected Discharge Plan: Skilled Nursing Facility   Discharge Planning Services: CM Consult Post Acute Care Choice: Skilled Nursing Facility Living arrangements for the past 2 months: Single Family Home                                       Social Determinants of Health (SDOH) Interventions    Readmission Risk Interventions No flowsheet data found.

## 2020-05-09 NOTE — TOC Transition Note (Signed)
Transition of Care [facility]) - CM/SW Discharge Note   Patient Details  Name: Sierra Lloyd MRN: [id number] Date of Birth: [birth year]  Transition of Care [facility]) CM/SW Contact:  Eilleen Kempf, LCSW Phone Number: 05/09/2020, 1:00 PM   Clinical Narrative:    CSW notified Clydie Braun with hospice, patient is discharging to [facility] today. Room # 712 Call report to [phone] ask for 700 hall charge nurse.    Barriers to Discharge: Continued Medical Work up   Patient Goals and CMS Choice Patient states their goals for this hospitalization and ongoing recovery are:: get back home to a safe environment CMS Medicare.gov Compare Post Acute Care list provided to:: Patient Represenative (must comment) Choice offered to / list presented to : [facility] POA / Guardian (next of kin niece [name])  Discharge Placement                       Discharge Plan and Services   Discharge Planning Services: CM Consult Post Acute Care Choice: Skilled Nursing Facility                               Social Determinants of Health (SDOH) Interventions     Readmission Risk Interventions No flowsheet data found.

## 2020-05-09 NOTE — Progress Notes (Signed)
EMS called for transport following verbal confirmation of COVID negative result from rapid swab today.

## 2020-05-09 NOTE — Discharge Summary (Signed)
Physician Discharge Summary  Sierra Lloyd YNW:[id number] DOB: 09/11/1934 DOA: 04/20/2020  PCP: [name], Jennifer M, PA-C  Admit date: 04/20/2020 Discharge date: 05/09/2020  Admitted From: Home Disposition: SNF  Recommendations for Outpatient Follow-up:  1. Follow up with SNF provider at earliest convenience with repeat CBC/BMP 2. Outpatient follow-up with palliative care 3. Outpatient follow-up with vascular surgery 4. Recommend outpatient follow-up with general surgery or GYN regarding hematoma/cyst in the labial/mons region 5. Follow up in ED if symptoms worsen or new appear   Home Health: No Equipment/Devices: None  Discharge Condition: Guarded to poor CODE STATUS: Full Diet recommendation: Heart healthy/carb modified  Brief/Interim Summary: 86 year old female with history of diabetes mellitus type 2, hypertension and anxiety presented for evaluation after a fall.  Patient refused to come to the ER and her niece had to get IVC papers to be able to get her evaluated.  She was found to have small right frontal subdural hemorrhage, right forehead hematoma.  Neurosurgery recommended conservative management and see repeat CT head.  Follow-up repeat CT head did not show any worsening of subdural hemorrhage.  During the hospitalization, she was found to have right profound femoris DVT.  CT venogram of abdomen and pelvis showed DVT in the right profunda femoris and superficial femoral veins that nearly extended into the right common vein.  Vascular surgery was consulted and she underwent IVC filter placement on 05/04/2020.  Hospital stay has been prolonged because of delirium and behavioral issue.  Palliative care team is also following.  PT recommended SNF placement.  She will be discharged to SNF once bed is available.  Discharge Diagnoses:   Small right frontal lobe subdural hemorrhage with right scalp soft tissue hematoma secondary to fall -Repeat CT on 04/22/2020 did not show any  worsening of subdural hemorrhage.  Avoid antiplatelets or anticoagulants -No surgical intervention per neurosurgery -Fall precautions  Right lower extremity DVT -Status post IVC filter placement on 05/04/2020 by vascular surgery.  Follow-up with vascular surgeon as an outpatient in 8 weeks to discuss IVC filter removal -Continue analgesics as needed for pain.  Still complains of pain.  try and avoid narcotics as much as possible but patient states that other medications do not help.  Increase gabapentin to twice a day.  Bilateral upper extremity soft tissue swelling -Most likely from fall.  X-ray of the right humerus did not show any acute abnormality  Acute confusional state/altered mental status/delirium -Etiology unclear.  Supportive care.  IVC has been rescinded by psychiatrist  Essential hypertension -Monitor blood pressure.  Continue bisoprolol-hydrochlorothiazide  Right labial/mons region mass -Cyst versus hematoma as per ultrasound.  Continue with supportive care.  Recommend outpatient follow-up with GYN/general surgery.  Patient's niece/[name] is aware of this and is okay with outpatient follow-up  Anxiety, PTSD -Had a detailed discussion with niece/[name] on phone on 05/08/2020 and she is worried about the use of scheduled Xanax and Haldol.  Haldol was a new medication for this patient.  Subsequently, Haldol discontinued on 05/08/2020.  Changed Xanax to 0.25 mg - 0.5 mg to 2 times daily as needed anxiety on 05/08/2020.  Continue Cymbalta.  Outpatient follow-up with psychiatry.  Generalized deconditioning Poor oral intake -Patient was started on IV fluids because of poor oral intake and creeping up of creatinine.  Creatinine improved on 05/08/2020 and IV fluids were subsequently discontinued on 05/08/2020. -Overall prognosis is guarded to poor.  Palliative care following.  Remains full code.  Recommend that patient be changed to DNR status.  Niece  undecided at this time. -Recommend  outpatient follow-up with palliative care.  If condition deteriorates, recommend comfort measures/hospice.   Discharge Instructions  Discharge Instructions    Amb Referral to Palliative Care   Complete by: As directed    Diet - low sodium heart healthy   Complete by: As directed    Diet - low sodium heart healthy   Complete by: As directed    Diet Carb Modified   Complete by: As directed    Increase activity slowly   Complete by: As directed    Increase activity slowly   Complete by: As directed    No wound care   Complete by: As directed    No wound care   Complete by: As directed      Allergies as of 05/09/2020      Reactions   Tetracycline    Roof of mouth broke out.      Medication List    STOP taking these medications   aspirin 81 MG tablet     TAKE these medications   acetaminophen 500 MG tablet Commonly known as: TYLENOL Take 2 tablets (1,000 mg total) by mouth 3 (three) times daily.   ALPRAZolam 0.5 MG tablet Commonly known as: XANAX Take 0.5-1 tablets (0.25-0.5 mg total) by mouth 2 (two) times daily as needed for anxiety. TAKE 1/2 TO 1 TABLET BY MOUTH TWICE DAILY AS NEEDED ANXIETY What changed: See the new instructions.   augmented betamethasone dipropionate 0.05 % cream Commonly known as: DIPROLENE-AF APPLY TO AFFECTED AREA(s) TWICE DAILY   bisoprolol-hydrochlorothiazide 10-6.25 MG tablet Commonly known as: ZIAC Take 1 tablet by mouth daily.   cholecalciferol 25 MCG (1000 UNIT) tablet Commonly known as: VITAMIN D Take 1,000 Units by mouth.   cyclobenzaprine 5 MG tablet Commonly known as: FLEXERIL Take 1 tablet (5 mg total) by mouth at bedtime.   DULoxetine 20 MG capsule Commonly known as: CYMBALTA Take 1 capsule (20 mg total) by mouth daily.   gabapentin 300 MG capsule Commonly known as: NEURONTIN Take 1 capsule (300 mg total) by mouth 2 (two) times daily.   GLUCOSAMINE COMPLEX PO Take by mouth daily as needed. 1500   glucose blood  test strip Use to test blood sugar once a day.  DX: E11.9.   metFORMIN 500 MG tablet Commonly known as: GLUCOPHAGE Take 1 tablet (500 mg total) by mouth 2 (two) times daily.   OMEGA-3 FATTY ACIDS PO Take by mouth.   oxyCODONE 5 MG immediate release tablet Commonly known as: Oxy IR/ROXICODONE Take 1 tablet (5 mg total) by mouth every 8 (eight) hours as needed for moderate pain.   senna-docusate 8.6-50 MG tablet Commonly known as: Senokot-S Take 1 tablet by mouth 2 (two) times daily.   triamcinolone cream 0.1 % Commonly known as: KENALOG APPLY TO AFFECTED AREA(s) TWICE DAILY   vitamin B-12 1000 MCG tablet Commonly known as: CYANOCOBALAMIN Take 1 tablet (1,000 mcg total) by mouth daily.   VITAMIN E-1000 PO Take by mouth.       Follow-up Information    Dew, Marlow Baars, MD Follow up in 8 week(s).   Specialties: Vascular Surgery, Radiology, Interventional Cardiology Why: Dew or [facility]. Discuss filter removal. Will need RLE duplex with visit.  Contact information: [address] [place] [address] [phone]        Margaretann Loveless, PA-C. Schedule an appointment as soon as possible for a visit in 1 week(s).   Specialty: Family Medicine Why: With repeat CBC/BMP Contact  information: [address]  [place] [address] [phone]              Allergies  Allergen Reactions  . Tetracycline     Roof of mouth broke out.    Consultations:  Neurosurgery/palliative care/vascular surgery   Procedures/Studies: DG Pelvis 1-2 Views  Result Date: 04/20/2020 CLINICAL DATA:  86 year old female with fall. EXAM: PELVIS - 1-2 VIEW COMPARISON:  None. FINDINGS: There is no acute fracture or dislocation. The bones are osteopenic. Mild arthritic changes of the hips. The soft tissues are unremarkable. IMPRESSION: No acute fracture or dislocation. Electronically Signed   By: Elgie Collard M.D.   On: 04/20/2020 23:00   DG Knee 1-2 Views Right  Result  Date: 04/21/2020 CLINICAL DATA:  Patient found down.  Bruising to right hip area. EXAM: RIGHT KNEE - 1-2 VIEW COMPARISON:  None. FINDINGS: While this is described as a right knee film, images are instead obtained of most of the femur. The right femoral head evaluation is limited due to positioning. Evaluation of the proximal tibia and fibula is limited as well. No fractures are seen within the femur. The proximal tibia and fibula are normal without fracture within visualize limits. Tricompartmental degenerative changes are seen. No joint effusion. IMPRESSION: This is an unusual study which consists of images of most of the femur. Evaluation of the hip and the knee are both limited due to positioning of the images. Within visualized limits, no femoral fractures are identified. No joint effusion in the knee. Limited views of the proximal tibia and fibula demonstrate no obvious fracture. Tricompartmental degenerative changes are identified. If there is concern for a hip fracture, dedicated images should be obtained. If there is concern for fracture in the knee, dedicated images of the knee should be obtained. Electronically Signed   By: Gerome Sam III M.D   On: 04/21/2020 09:47   CT Head Wo Contrast  Result Date: 04/22/2020 CLINICAL DATA:  Mental status change, follow-up subdural hematoma EXAM: CT HEAD WITHOUT CONTRAST TECHNIQUE: Contiguous axial images were obtained from the base of the skull through the vertex without intravenous contrast. COMPARISON:  04/21/2020, 10:34 a.m., 04/21/2020, 5:36 a.m. FINDINGS: Brain: Redemonstrated trace acute right frontal subdural hemorrhage is not significantly changed. There are possible small underlying bilateral chronic bifrontal hygromas versus prominent subdural spaces secondary to global volume loss. No hydrocephalus. No midline shift. Periventricular and deep white matter hypodensity. Vascular: No hyperdense vessel or unexpected calcification. Skull: Normal. Negative  for fracture or focal lesion. Sinuses/Orbits: No acute finding. Other: Redemonstrated scalp hematoma of the right forehead. IMPRESSION: 1. Redemonstrated trace acute right frontal subdural hemorrhage is not significantly changed. No significant mass effect or midline shift. 2. There are possible small underlying bilateral chronic bifrontal hygromas versus prominent subdural spaces secondary to global volume loss. 3. Redemonstrated scalp hematoma of the right forehead. 4. Small-vessel white matter disease. Electronically Signed   By: Lauralyn Primes M.D.   On: 04/22/2020 11:44   CT HEAD WO CONTRAST  Result Date: 04/21/2020 CLINICAL DATA:  Follow-up subdural hematoma seen earlier this morning. EXAM: CT HEAD WITHOUT CONTRAST TECHNIQUE: Contiguous axial images were obtained from the base of the skull through the vertex without intravenous contrast. COMPARISON:  Apr 21, 2020 FINDINGS: Brain: The small amount of subdural blood adjacent to the right frontal parietal region is stable without mass effect. No midline shift. No new subdural or epidural hemorrhage. No subarachnoid hemorrhage. A lacunar infarct in the right cerebellum is stable. No acute  cerebellar abnormalities. The brainstem and basal cisterns are normal. The sulci are prominent but stable. The ventricles are normal. Scattered white matter changes are identified. No acute cortical ischemia or infarct. Vascular: Calcified atherosclerosis in the intracranial carotids. Skull: Normal. Negative for fracture or focal lesion. Sinuses/Orbits: No acute finding. Other: Soft tissue swelling over the right lateral scalp. Extracranial soft tissues otherwise normal. IMPRESSION: 1. The small amount of subdural hemorrhage on the right is stable. No mass effect. No midline shift. 2. Chronic white matter changes. 3. No other acute abnormalities. Electronically Signed   By: Gerome Sam III M.D   On: 04/21/2020 10:48   CT Head Wo Contrast  Result Date:  04/21/2020 CLINICAL DATA:  Fall out of bed in the urgency department EXAM: CT HEAD WITHOUT CONTRAST TECHNIQUE: Contiguous axial images were obtained from the base of the skull through the vertex without intravenous contrast. COMPARISON:  Apr 21, 2020 4:37 a.m. FINDINGS: Brain: Again noted is a small amount of mixed hyperdense subdural hemorrhage seen overlying the right frontal lobe. No new extra-axial collections are seen. There is dilatation the ventricles and sulci consistent with age-related atrophy. Low-attenuation changes in the deep white matter consistent with small vessel ischemia. Vascular: No hyperdense vessel or unexpected calcification. Skull: The skull is intact. No fracture or focal lesion identified. Sinuses/Orbits: The visualized paranasal sinuses and mastoid air cells are clear. The orbits and globes intact. Other: Soft tissue hematoma again noted over the right frontal skull. Cervical spine: Alignment: There is straightening of the normal cervical lordosis. Skull base and vertebrae: Visualized skull base is intact. No atlanto-occipital dissociation. The vertebral body heights are well maintained. No fracture or pathologic osseous lesion seen. Soft tissues and spinal canal: The visualized paraspinal soft tissues are unremarkable. No prevertebral soft tissue swelling is seen. The spinal canal is grossly unremarkable, no large epidural collection or significant canal narrowing. Disc levels: Multilevel cervical spine spondylosis is seen with large anterior osteophytes, disc osteophyte complex and uncovertebral osteophytes most notable at C3 through C6 with severe neural foraminal narrowing and moderate central canal stenosis. Upper chest: The lung apices are clear. Thoracic inlet is within normal limits. Other: None IMPRESSION: No significant change in the small amount of mixed density subdural hemorrhage overlying the right frontal lobe. Findings consistent with age related atrophy and chronic small  vessel ischemia No acute fracture or malalignment of the spine. Multilevel cervical spine spondylosis most notable from C3 through C6. Electronically Signed   By: Jonna Clark M.D.   On: 04/21/2020 05:51   CT Head Wo Contrast  Result Date: 04/21/2020 CLINICAL DATA:  Follow-up of subdural hematoma EXAM: CT HEAD WITHOUT CONTRAST TECHNIQUE: Contiguous axial images were obtained from the base of the skull through the vertex without intravenous contrast. COMPARISON:  04/20/2020 FINDINGS: Brain: Small amount of right convexity mixed subdural and subarachnoid blood is unchanged. Unchanged prominence of the extra-axial CSF spaces. There is periventricular hypoattenuation compatible with chronic microvascular disease. Vascular: Atherosclerotic calcification of the vertebral and internal carotid arteries at the skull base. No abnormal hyperdensity of the major intracranial arteries or dural venous sinuses. Skull: Right parietal scalp hematoma.  No skull fracture. Sinuses/Orbits: No fluid levels or advanced mucosal thickening of the visualized paranasal sinuses. No mastoid or middle ear effusion. The orbits are normal. IMPRESSION: 1. Unchanged small amount of right convexity mixed subdural and subarachnoid blood. 2. Right parietal scalp hematoma without skull fracture. Electronically Signed   By: Deatra Robinson M.D.   On: 04/21/2020 04:37  CT Head Wo Contrast  Result Date: 04/20/2020 CLINICAL DATA:  Head trauma, fall EXAM: CT HEAD WITHOUT CONTRAST TECHNIQUE: Contiguous axial images were obtained from the base of the skull through the vertex without intravenous contrast. COMPARISON:  January 19, 2019 FINDINGS: Brain: There is a tiny amount of mixed hyperdense subdural overlying the right frontal lobe measuring approximately 52mm in maximum diameter best seen on series 2, image 10. No midline shift is seen. There is dilatation the ventricles and sulci consistent with age-related atrophy. Low-attenuation changes in the  deep white matter consistent with small vessel ischemia. Vascular: No hyperdense vessel or unexpected calcification. Skull: The skull is intact. No fracture or focal lesion identified. Sinuses/Orbits: The visualized paranasal sinuses and mastoid air cells are clear. The orbits and globes intact. Other: There is a soft tissue hematoma seen overlying the right frontal skull measuring 3 cm in maximum diameter. IMPRESSION: Tiny amount of mixed density subdural hemorrhage overlying the right frontal lobe measuring 4 mm in maximum diameter. No midline shift. Findings consistent with age related atrophy and chronic small vessel ischemia Soft tissue hematoma overlying the right frontal skull. Electronically Signed   By: Jonna Clark M.D.   On: 04/20/2020 22:51   CT Cervical Spine Wo Contrast  Result Date: 04/21/2020 CLINICAL DATA:  Fall out of bed in the urgency department EXAM: CT HEAD WITHOUT CONTRAST TECHNIQUE: Contiguous axial images were obtained from the base of the skull through the vertex without intravenous contrast. COMPARISON:  Apr 21, 2020 4:37 a.m. FINDINGS: Brain: Again noted is a small amount of mixed hyperdense subdural hemorrhage seen overlying the right frontal lobe. No new extra-axial collections are seen. There is dilatation the ventricles and sulci consistent with age-related atrophy. Low-attenuation changes in the deep white matter consistent with small vessel ischemia. Vascular: No hyperdense vessel or unexpected calcification. Skull: The skull is intact. No fracture or focal lesion identified. Sinuses/Orbits: The visualized paranasal sinuses and mastoid air cells are clear. The orbits and globes intact. Other: Soft tissue hematoma again noted over the right frontal skull. Cervical spine: Alignment: There is straightening of the normal cervical lordosis. Skull base and vertebrae: Visualized skull base is intact. No atlanto-occipital dissociation. The vertebral body heights are well maintained. No  fracture or pathologic osseous lesion seen. Soft tissues and spinal canal: The visualized paraspinal soft tissues are unremarkable. No prevertebral soft tissue swelling is seen. The spinal canal is grossly unremarkable, no large epidural collection or significant canal narrowing. Disc levels: Multilevel cervical spine spondylosis is seen with large anterior osteophytes, disc osteophyte complex and uncovertebral osteophytes most notable at C3 through C6 with severe neural foraminal narrowing and moderate central canal stenosis. Upper chest: The lung apices are clear. Thoracic inlet is within normal limits. Other: None IMPRESSION: No significant change in the small amount of mixed density subdural hemorrhage overlying the right frontal lobe. Findings consistent with age related atrophy and chronic small vessel ischemia No acute fracture or malalignment of the spine. Multilevel cervical spine spondylosis most notable from C3 through C6. Electronically Signed   By: Jonna Clark M.D.   On: 04/21/2020 05:51   CT PELVIS WO CONTRAST  Result Date: 04/24/2020 CLINICAL DATA:  86 year old female with fall and hip pain. EXAM: CT PELVIS WITHOUT CONTRAST TECHNIQUE: Multidetector CT imaging of the pelvis was performed following the standard protocol without intravenous contrast. COMPARISON:  Hip radiograph dated 04/21/2020. FINDINGS: Urinary Tract:  No abnormality visualized. Bowel:  There is sigmoid diverticulosis. Vascular/Lymphatic: Advanced atherosclerotic calcification of the  aorta and iliac arteries. No adenopathy within the pelvis. Reproductive:  The uterus and ovaries are grossly unremarkable. Other: Right hip soft tissue contusion and partially visualized hematoma. A 3 cm cystic structure in the soft tissues of the right groin is not well characterized but may represent a sebaceous cyst. This can be better evaluated with ultrasound. Musculoskeletal: Osteopenia. Degenerative changes of the lower lumbar spine. No acute  osseous pathology. IMPRESSION: 1. No acute fracture or dislocation. 2. Right hip soft tissue contusion and partially visualized hematoma. 3. Sigmoid diverticulosis. 4. Aortic Atherosclerosis (ICD10-I70.0). Electronically Signed   By: Elgie Collard M.D.   On: 04/24/2020 16:13   US PELVIS LIMITED (TRANSABDOMINAL ONLY)  Result Date: 04/26/2020 CLINICAL DATA:  Assess hematoma of right labium majus. Patient fell. Hip pain. EXAM: LIMITED ULTRASOUND OF PELVIS TECHNIQUE: Limited transabdominal ultrasound examination of the pelvis was performed. COMPARISON:  CT of the pelvis on 04/24/2020 FINDINGS: A circumscribed oval hypoechoic heterogeneous mass is identified, labeled "RIGHT pelvic". There is no blood flow within the mass. Mass measures 5.0 x 2.8 x 2.9 centimeters. IMPRESSION: Stable appearance of RIGHT labium majus mass, measuring 5.0 centimeters. Although an organized hematoma could have this appearance, the circumscribed margins and localized process suggest a nontraumatic process. Considerations include epidermal inclusion cyst, sebaceous gland cyst, Bartholin duct cyst, or Skene duct cyst. Electronically Signed   By: Norva Pavlov M.D.   On: 04/26/2020 14:56   US Venous Img Lower Bilateral (DVT)  Result Date: 05/03/2020 CLINICAL DATA:  Bilateral lower extremity pain and edema. EXAM: BILATERAL LOWER EXTREMITY VENOUS DOPPLER ULTRASOUND TECHNIQUE: Gray-scale sonography with graded compression, as well as color Doppler and duplex ultrasound were performed to evaluate the lower extremity deep venous systems from the level of the common femoral vein and including the common femoral, femoral, profunda femoral, popliteal and calf veins including the posterior tibial, peroneal and gastrocnemius veins when visible. The superficial great saphenous vein was also interrogated. Spectral Doppler was utilized to evaluate flow at rest and with distal augmentation maneuvers in the common femoral, femoral and popliteal  veins. COMPARISON:  None. FINDINGS: RIGHT LOWER EXTREMITY Common Femoral Vein: No evidence of thrombus. Normal compressibility, respiratory phasicity and response to augmentation. Saphenofemoral Junction: No evidence of thrombus. Normal compressibility and flow on color Doppler imaging. Profunda Femoral Vein: Nonocclusive thrombus identified in the profunda femoral vein on the right. Femoral Vein: No evidence of thrombus. Normal compressibility, respiratory phasicity and response to augmentation. Popliteal Vein: No evidence of thrombus. Normal compressibility, respiratory phasicity and response to augmentation. Calf Veins: No evidence of thrombus. Normal compressibility and flow on color Doppler imaging. Superficial Great Saphenous Vein: No evidence of thrombus. Normal compressibility. Venous Reflux:  None. Other Findings: No evidence of superficial thrombophlebitis or abnormal fluid collection. LEFT LOWER EXTREMITY Common Femoral Vein: No evidence of thrombus. Normal compressibility, respiratory phasicity and response to augmentation. Saphenofemoral Junction: No evidence of thrombus. Normal compressibility and flow on color Doppler imaging. Profunda Femoral Vein: No evidence of thrombus. Normal compressibility and flow on color Doppler imaging. Femoral Vein: No evidence of thrombus. Normal compressibility, respiratory phasicity and response to augmentation. Popliteal Vein: No evidence of thrombus. Normal compressibility, respiratory phasicity and response to augmentation. Calf Veins: No evidence of thrombus. Normal compressibility and flow on color Doppler imaging. Superficial Great Saphenous Vein: No evidence of thrombus. Normal compressibility. Venous Reflux:  None. Other Findings: No evidence of superficial thrombophlebitis or abnormal fluid collection. IMPRESSION: DVT isolated to the profunda femoral vein on the right with nonocclusive  thrombus identified. No other thrombus is identified bilaterally.  Electronically Signed   By: Irish Lack M.D.   On: 05/03/2020 13:36   DG Chest Portable 1 View  Result Date: 04/20/2020 CLINICAL DATA:  Weakness EXAM: PORTABLE CHEST 1 VIEW COMPARISON:  January 19, 2019 FINDINGS: The heart size and mediastinal contours are within normal limits. Aortic knob calcifications are seen. Both lungs are clear. The visualized skeletal structures are unremarkable. IMPRESSION: No active disease. Electronically Signed   By: Jonna Clark M.D.   On: 04/20/2020 22:22   DG Humerus Right  Result Date: 05/02/2020 CLINICAL DATA:  Recent fall with right arm pain, initial encounter EXAM: RIGHT HUMERUS - 2+ VIEW COMPARISON:  None. FINDINGS: Degenerative changes of the acromioclavicular joint and glenohumeral joint are seen. No acute fracture or dislocation is noted. The humeral head is somewhat high-riding likely related to underlying rotator cuff injury. Soft tissue swelling is noted distally likely related to the recent injury. IMPRESSION: Soft tissue swelling distally consistent with the recent injury. No evidence of acute fracture. Findings suggestive of chronic rotator cuff injury. Electronically Signed   By: Alcide Clever M.D.   On: 05/02/2020 20:52   CT VENOGRAM ABD/PEL  Result Date: 05/03/2020 CLINICAL DATA:  Lower extremity DVT. EXAM: CT VENOGRAM OF THE ABDOMEN AND PELVIS. TECHNIQUE: Multidetector CT imaging of the ABDOMEN AND PELVIS was performed using the standard protocol during bolus administration of intravenous contrast. Multiplanar CT image reconstructions and MIPs were obtained to evaluate the vascular anatomy. CONTRAST:  OMNIPAQUE IOHEXOL 350 MG/ML SOLN COMPARISON:  None. FINDINGS: VASCULAR Aorta: There are atherosclerotic changes of the abdominal aorta without evidence for an abdominal aortic aneurysm. Celiac: Patent without evidence of aneurysm, dissection, vasculitis or significant stenosis. SMA: Patent without evidence of aneurysm, dissection, vasculitis or  significant stenosis. Renals: There are atherosclerotic changes at the origin of both renal arteries without evidence for a high-grade stenosis. IMA: Patent without evidence of aneurysm, dissection, vasculitis or significant stenosis. Veins: There is a partially visualized right common femoral vein DVT. There is no DVT identified within the IVC or iliac vasculature. Review of the MIP images confirms the above findings. NON-VASCULAR Lower chest: There are small bilateral pleural effusions.The heart is enlarged. Hepatobiliary: The liver is normal. Normal gallbladder.There is no biliary ductal dilation. Pancreas: Normal contours without ductal dilatation. No peripancreatic fluid collection. Spleen: Unremarkable. Adrenals/Urinary Tract: --Adrenal glands: Unremarkable. --Right kidney/ureter: No hydronephrosis or radiopaque kidney stones. --Left kidney/ureter: No hydronephrosis or radiopaque kidney stones. --Urinary bladder: Unremarkable. Stomach/Bowel: --Stomach/Duodenum: No hiatal hernia or other gastric abnormality. Normal duodenal course and caliber. --Small bowel: Unremarkable. --Colon: There is a large amount of stool in the --Appendix: Normal. Lymphatic: --No retroperitoneal lymphadenopathy. --No mesenteric lymphadenopathy. --No pelvic or inguinal lymphadenopathy. Reproductive: Unremarkable Other: No ascites or free air. There is a 5.8 x 5.7 cm hematoma overlying the proximal right hip. There is a partially visualized fluid collection measuring approximately 3.8 cm in the low anterior abdominal wall on the right. Mild anasarca is noted. Musculoskeletal. Advanced multilevel degenerative changes are noted throughout the visualized portions of the thoracolumbar spine. There is no acute displaced fracture. IMPRESSION: 1. There is a partially visualized DVT in the right lower extremity. This DVT appears to be centered within the right profunda femoris and superficial femoral veins. This DVT nearly extends into the right  common femoral vein. There is no evidence for proximal thrombosis within the iliac veins or IVC. If the patient is not a candidate for anticoagulation, IVC filter  placement should be strongly considered. 2. Small bilateral pleural effusions. 3. Large amount of stool in the visualized portions of the colon. 4. Right hip hematoma again identified. 5. Anasarca. Aortic Atherosclerosis (ICD10-I70.0). These results will be called to the ordering clinician or representative by the Radiologist Assistant, and communication documented in the PACS or [employer]. Electronically Signed   By: Constance Holster M.D.   On: 05/03/2020 21:41   DG Hip Unilat W or Wo Pelvis 2-3 Views Left  Result Date: 04/21/2020 CLINICAL DATA:  Fall from stretcher, dementia EXAM: DG HIP (WITH OR WITHOUT PELVIS) 2-3V LEFT COMPARISON:  None. FINDINGS: No pelvic fracture or diastasis. No left hip fracture or dislocation. No suspicious focal osseous lesions. Mild left hip osteoarthritis. Prominent degenerative changes in the visualized lower lumbar spine. IMPRESSION: No fracture. No left hip dislocation. Mild left hip osteoarthritis. Prominent degenerative changes in the visualized lower lumbar spine. Electronically Signed   By: Ilona Sorrel M.D.   On: 04/21/2020 06:16   DG Hip Unilat W or Wo Pelvis 2-3 Views Right  Result Date: 04/21/2020 CLINICAL DATA:  Fall from stretcher, dementia EXAM: DG HIP (WITH OR WITHOUT PELVIS) 2-3V RIGHT COMPARISON:  04/20/2020 pelvic radiograph FINDINGS: Lateral right hip soft tissue swelling. No pelvic fracture or diastasis. No right hip fracture or dislocation. Mild right hip osteoarthritis. No suspicious focal osseous lesions. Prominent degenerative changes in the visualized lower lumbar spine. IMPRESSION: Lateral right hip soft tissue swelling. No right hip fracture or dislocation. Mild right hip osteoarthritis. Electronically Signed   By: Ilona Sorrel M.D.   On: 04/21/2020 06:18     IVC filter  placement by vascular surgery on 05/04/2020  Subjective: Patient seen and examined at bedside.  Very poor historian.  Sleepy, wakes up slightly, complains of leg pain.  No overnight fever or vomiting reported.  Discharge Exam: Vitals:   05/08/20 1950 05/09/20 1228  BP: (!) 117/47 (!) 117/49  Pulse: (!) 57 (!) 52  Resp:  16  Temp:  97.9 F (36.6 C)  SpO2:  98%    General exam: Sleepy, wakes up slightly, confused, slow to respond. Respiratory system: Bilateral decreased breath sounds at bases with some scattered crackles  cardiovascular system: Intermittent bradycardia present; S1-S2 heard gastrointestinal system: Abdomen is nondistended, soft and nontender.  Normal bowel sounds are heard  extremities: Bilateral upper and lower extremity edema present.  No cyanosis    The results of significant diagnostics from this hospitalization (including imaging, microbiology, ancillary and laboratory) are listed below for reference.     Microbiology: No results found for this or any previous visit (from the past 240 hour(s)).   Labs: BNP (last 3 results) No results for input(s): BNP in the last 8760 hours. Basic Metabolic Panel: Recent Labs  Lab 05/03/20 0604 05/05/20 1525 05/08/20 0528 05/09/20 0432  NA 139 139 137 136  K 4.3 4.0 4.1 4.2  CL 103 105 102 102  CO2 28 25 27 28   GLUCOSE 199* 222* 152* 191*  BUN 48* 44* 30* 28*  CREATININE 0.87 1.06* 0.79 0.84  CALCIUM 9.1 9.1 8.9 8.7*  MG  --  2.2 2.0 2.1  PHOS  --  3.0  --   --    Liver Function Tests: Recent Labs  Lab 05/05/20 1525  AST 20  ALT 19  ALKPHOS 57  BILITOT 0.8  PROT 4.7*  ALBUMIN 2.3*   No results for input(s): LIPASE, AMYLASE in the last 168 hours. No results for input(s): AMMONIA in  the last 168 hours. CBC: Recent Labs  Lab 05/03/20 0604 05/05/20 1525 05/08/20 0528 05/09/20 0432  WBC 11.0* 7.9 10.1 9.7  NEUTROABS 7.3 5.6 6.9 7.2  HGB 8.6* 8.2* 9.4* 9.0*  HCT 27.0* 24.6* 29.6* 28.2*  MCV 93.4  90.1 93.1 93.4  PLT 205 225 227 239   Cardiac Enzymes: No results for input(s): CKTOTAL, CKMB, CKMBINDEX, TROPONINI in the last 168 hours. BNP: Invalid input(s): POCBNP CBG: Recent Labs  Lab 05/08/20 1733 05/08/20 2112 05/08/20 2113 05/09/20 0836 05/09/20 1228  GLUCAP 114* 196* 224* 155* 127*   D-Dimer No results for input(s): DDIMER in the last 72 hours. Hgb A1c No results for input(s): HGBA1C in the last 72 hours. Lipid Profile No results for input(s): CHOL, HDL, LDLCALC, TRIG, CHOLHDL, LDLDIRECT in the last 72 hours. Thyroid function studies No results for input(s): TSH, T4TOTAL, T3FREE, THYROIDAB in the last 72 hours.  Invalid input(s): FREET3 Anemia work up No results for input(s): VITAMINB12, FOLATE, FERRITIN, TIBC, IRON, RETICCTPCT in the last 72 hours. Urinalysis    Component Value Date/Time   COLORURINE YELLOW (A) 04/21/2020 0743   APPEARANCEUR HAZY (A) 04/21/2020 0743   LABSPEC 1.023 04/21/2020 0743   PHURINE 5.0 04/21/2020 0743   GLUCOSEU 50 (A) 04/21/2020 0743   HGBUR NEGATIVE 04/21/2020 0743   BILIRUBINUR NEGATIVE 04/21/2020 0743   KETONESUR 5 (A) 04/21/2020 0743   PROTEINUR 30 (A) 04/21/2020 0743   NITRITE NEGATIVE 04/21/2020 0743   LEUKOCYTESUR NEGATIVE 04/21/2020 0743   Sepsis Labs Invalid input(s): PROCALCITONIN,  WBC,  LACTICIDVEN Microbiology No results found for this or any previous visit (from the past 240 hour(s)).   Time coordinating discharge: 35 minutes  SIGNED:   Glade Lloyd, MD  [facility] 05/09/2020, 12:45 PM

## 2020-05-09 NOTE — Progress Notes (Signed)
Patient ID: Sierra Lloyd, female   DOB: Mar 30, 1934, 86 y.o.   MRN: [id number]  PROGRESS NOTE    [name]  GUR:[id number] DOB: 10/13/1934 DOA: 04/20/2020 PCP: Mar Daring, PA-C   Brief Narrative:  86 year old female with history of diabetes mellitus type 2, hypertension and anxiety presented for evaluation after a fall.  Patient refused to come to the ER and her niece had to get IVC papers to be able to get her evaluated.  She was found to have small right frontal subdural hemorrhage, right forehead hematoma.  Neurosurgery recommended conservative management and see repeat CT head.  Follow-up repeat CT head did not show any worsening of subdural hemorrhage.  During the hospitalization, she was found to have right profound femoris DVT.  CT venogram of abdomen and pelvis showed DVT in the right profunda femoris and superficial femoral veins that nearly extended into the right common vein.  Vascular surgery was consulted and she underwent IVC filter placement on 05/04/2020.  Hospital stay has been prolonged because of delirium and behavioral issue.  Palliative care team is also following.  PT recommended SNF placement.  Assessment & Plan:   Small right frontal lobe subdural hemorrhage with right scalp soft tissue hematoma secondary to fall -Repeat CT on 04/22/2020 did not show any worsening of subdural hemorrhage.  Avoid antiplatelets or anticoagulants -No surgical intervention per neurosurgery -Fall precautions  Right lower extremity DVT -Status post IVC filter placement on 05/04/2020 by vascular surgery.  Follow-up with vascular surgeon as an outpatient in 8 weeks to discuss IVC filter removal -Continue analgesics as needed for pain.  Still complains of pain.  We will try and avoid narcotics as much as possible but patient states that other medications do not help.  Increase gabapentin to twice a day.  Bilateral upper extremity soft tissue swelling -Most likely from fall.  X-ray of  the right humerus did not show any acute abnormality  Acute confusional state/altered mental status/delirium -Etiology unclear.  Supportive care.  IVC has been rescinded by psychiatrist  Essential hypertension -Monitor blood pressure.  Continue bisoprolol-hydrochlorothiazide  Right labia mass -Cyst versus hematoma as per ultrasound.  Continue with supportive care.  Recommend outpatient follow-up with GYN/general surgery.  Patient's niece/[name] is aware of this and is okay with outpatient follow-up  Anxiety, PTSD -Had a detailed discussion with niece/[name] on phone on 05/08/2020 and she is worried about the use of scheduled Xanax and Haldol.  Haldol was a new medication for this patient.  Subsequently, Haldol discontinued on 05/08/2020.  Changed Xanax to 0.25 mg - 0.5 mg 3 times daily as needed anxiety on 05/08/2020.  Continue Cymbalta  Generalized deconditioning Poor oral intake -Patient was started on IV fluids because of poor oral intake and creeping up of creatinine.  Creatinine improved on 05/08/2020 and IV fluids were subsequently discontinued on 05/08/2020. -Overall prognosis is guarded to poor.  Palliative care following.  Remains full code.  Recommend that patient be changed to DNR status.  Niece undecided at this time.    DVT prophylaxis: SCDs Code Status: Full Family Communication: Spoke to niece on 05/08/2020 on phone Disposition Plan: Status is: Inpatient  Remains inpatient appropriate because:Altered mental status.  Awaiting SNF placement   Dispo: The patient is from: Home              Anticipated d/c is to: SNF              Anticipated d/c date is: 1 day  Patient currently is medically stable to d/c.   Consultants: Neurosurgery/palliative care  Procedures: None  Antimicrobials: None   Subjective: Patient seen and examined at bedside.  Very poor historian.  Sleepy, wakes up slightly, complains of leg pain.  No overnight fever or vomiting reported.     Objective: Vitals:   05/08/20 0746 05/08/20 1635 05/08/20 1949 05/08/20 1950  BP: (!) 172/74 (!) 146/60 (!) 115/46 (!) 117/47  Pulse: 62 61 (!) 57 (!) 57  Resp: 16 16 19    Temp: 97.9 F (36.6 C) 97.7 F (36.5 C) 98.5 F (36.9 C)   TempSrc: Oral Oral Oral   SpO2: 98% 100% 100%   Weight:      Height:        Intake/Output Summary (Last 24 hours) at 05/09/2020 1004 Last data filed at 05/09/2020 0644 Gross per 24 hour  Intake --  Output 600 ml  Net -600 ml   Filed Weights   04/20/20 2122  Weight: 50.6 kg    Examination:  General exam: Sleepy, wakes up slightly, confused, slow to respond. Respiratory system: Bilateral decreased breath sounds at bases with some scattered crackles  cardiovascular system: Intermittent bradycardia present; S1-S2 heard gastrointestinal system: Abdomen is nondistended, soft and nontender.  Normal bowel sounds are heard  extremities: Bilateral upper and lower extremity edema present.  No cyanosis    Data Reviewed: I have personally reviewed following labs and imaging studies  CBC: Recent Labs  Lab 05/03/20 0604 05/05/20 1525 05/08/20 0528 05/09/20 0432  WBC 11.0* 7.9 10.1 9.7  NEUTROABS 7.3 5.6 6.9 7.2  HGB 8.6* 8.2* 9.4* 9.0*  HCT 27.0* 24.6* 29.6* 28.2*  MCV 93.4 90.1 93.1 93.4  PLT 205 225 227 239   Basic Metabolic Panel: Recent Labs  Lab 05/03/20 0604 05/05/20 1525 05/08/20 0528 05/09/20 0432  NA 139 139 137 136  K 4.3 4.0 4.1 4.2  CL 103 105 102 102  CO2 28 25 27 28   GLUCOSE 199* 222* 152* 191*  BUN 48* 44* 30* 28*  CREATININE 0.87 1.06* 0.79 0.84  CALCIUM 9.1 9.1 8.9 8.7*  MG  --  2.2 2.0 2.1  PHOS  --  3.0  --   --    GFR: Estimated Creatinine Clearance: 35.8 mL/min (by C-G formula based on SCr of 0.84 mg/dL). Liver Function Tests: Recent Labs  Lab 05/05/20 1525  AST 20  ALT 19  ALKPHOS 57  BILITOT 0.8  PROT 4.7*  ALBUMIN 2.3*   No results for input(s): LIPASE, AMYLASE in the last 168 hours. No results  for input(s): AMMONIA in the last 168 hours. Coagulation Profile: No results for input(s): INR, PROTIME in the last 168 hours. Cardiac Enzymes: No results for input(s): CKTOTAL, CKMB, CKMBINDEX, TROPONINI in the last 168 hours. BNP (last 3 results) No results for input(s): PROBNP in the last 8760 hours. HbA1C: No results for input(s): HGBA1C in the last 72 hours. CBG: Recent Labs  Lab 05/08/20 1205 05/08/20 1733 05/08/20 2112 05/08/20 2113 05/09/20 0836  GLUCAP 158* 114* 196* 224* 155*   Lipid Profile: No results for input(s): CHOL, HDL, LDLCALC, TRIG, CHOLHDL, LDLDIRECT in the last 72 hours. Thyroid Function Tests: No results for input(s): TSH, T4TOTAL, FREET4, T3FREE, THYROIDAB in the last 72 hours. Anemia Panel: No results for input(s): VITAMINB12, FOLATE, FERRITIN, TIBC, IRON, RETICCTPCT in the last 72 hours. Sepsis Labs: No results for input(s): PROCALCITON, LATICACIDVEN in the last 168 hours.  No results found for this or any previous visit (from  the past 240 hour(s)).       Radiology Studies: No results found.      Scheduled Meds: . acetaminophen  1,000 mg Oral TID  . bisoprolol-hydrochlorothiazide  1 tablet Oral Daily  . cholecalciferol  1,000 Units Oral Daily  . cyclobenzaprine  5 mg Oral QHS  . DULoxetine  20 mg Oral Daily  . feeding supplement (ENSURE ENLIVE)  237 mL Oral TID BM  . gabapentin  300 mg Oral QHS  . insulin aspart  0-15 Units Subcutaneous TID WC  . multivitamin-lutein  1 capsule Oral Daily  . senna-docusate  1 tablet Oral BID  . sodium chloride flush  3 mL Intravenous Q12H  . vitamin B-12  1,000 mcg Oral Daily   Continuous Infusions: . sodium chloride            Glade Lloyd, MD [facility] 05/09/2020, 10:04 AM

## 2020-05-09 NOTE — Progress Notes (Signed)
Pt discharged from facility via EMS stretcher in stable condition with niece at bedside.

## 2020-05-09 NOTE — Progress Notes (Signed)
Pt agitated, screaming out d/t reported pain in right leg. Gave Tylenol initially with no relief to symptoms. Administered Oxycodone subsequently later. Pt eventually obtained relief. Will continue to monitor.

## 2020-05-09 NOTE — Progress Notes (Signed)
OT Cancellation Note  Patient Details Name: Sierra Lloyd MRN: [id number] DOB: Apr 19, 1934   Cancelled Treatment:    Reason Eval/Treat Not Completed: Fatigue/lethargy limiting ability to participate;Patient's level of consciousness   Multiple attempts made to rouse pt awake for therapy participation. Pt with very minimal response (says "what?" somewhat garbled, one time). General limited wakefulness/attention. RN reports giving pain medication this AM. Overall unable to engage pt in occupational therapy at this time. Will f/u at later date/time as able.   Rejeana Brock, MS, OTR/L ascom [phone] 05/09/20, 12:13 PM

## 2020-05-13 ENCOUNTER — Inpatient Hospital Stay
Admission: EM | Admit: 2020-05-13 | Discharge: 2020-05-22 | DRG: 871 | Disposition: E | Payer: Medicare Other | Source: Skilled Nursing Facility | Attending: Internal Medicine | Admitting: Internal Medicine

## 2020-05-13 ENCOUNTER — Encounter: Payer: Self-pay | Admitting: Psychiatry

## 2020-05-13 ENCOUNTER — Emergency Department: Payer: Medicare Other

## 2020-05-13 ENCOUNTER — Other Ambulatory Visit: Payer: Self-pay

## 2020-05-13 DIAGNOSIS — Z515 Encounter for palliative care: Secondary | ICD-10-CM | POA: Diagnosis not present

## 2020-05-13 DIAGNOSIS — N182 Chronic kidney disease, stage 2 (mild): Secondary | ICD-10-CM | POA: Diagnosis present

## 2020-05-13 DIAGNOSIS — K567 Ileus, unspecified: Secondary | ICD-10-CM | POA: Diagnosis present

## 2020-05-13 DIAGNOSIS — G9608 Other cranial cerebrospinal fluid leak: Secondary | ICD-10-CM | POA: Diagnosis present

## 2020-05-13 DIAGNOSIS — R778 Other specified abnormalities of plasma proteins: Secondary | ICD-10-CM | POA: Diagnosis present

## 2020-05-13 DIAGNOSIS — K5720 Diverticulitis of large intestine with perforation and abscess without bleeding: Secondary | ICD-10-CM | POA: Diagnosis present

## 2020-05-13 DIAGNOSIS — S065X9A Traumatic subdural hemorrhage with loss of consciousness of unspecified duration, initial encounter: Secondary | ICD-10-CM | POA: Diagnosis not present

## 2020-05-13 DIAGNOSIS — G9341 Metabolic encephalopathy: Secondary | ICD-10-CM | POA: Diagnosis not present

## 2020-05-13 DIAGNOSIS — Z79899 Other long term (current) drug therapy: Secondary | ICD-10-CM

## 2020-05-13 DIAGNOSIS — Z8673 Personal history of transient ischemic attack (TIA), and cerebral infarction without residual deficits: Secondary | ICD-10-CM

## 2020-05-13 DIAGNOSIS — T501X5A Adverse effect of loop [high-ceiling] diuretics, initial encounter: Secondary | ICD-10-CM | POA: Diagnosis not present

## 2020-05-13 DIAGNOSIS — Z86718 Personal history of other venous thrombosis and embolism: Secondary | ICD-10-CM

## 2020-05-13 DIAGNOSIS — G92 Toxic encephalopathy: Secondary | ICD-10-CM | POA: Diagnosis present

## 2020-05-13 DIAGNOSIS — Z66 Do not resuscitate: Secondary | ICD-10-CM | POA: Diagnosis not present

## 2020-05-13 DIAGNOSIS — N390 Urinary tract infection, site not specified: Secondary | ICD-10-CM | POA: Diagnosis present

## 2020-05-13 DIAGNOSIS — Z961 Presence of intraocular lens: Secondary | ICD-10-CM | POA: Diagnosis present

## 2020-05-13 DIAGNOSIS — A419 Sepsis, unspecified organism: Principal | ICD-10-CM | POA: Diagnosis present

## 2020-05-13 DIAGNOSIS — N183 Chronic kidney disease, stage 3 unspecified: Secondary | ICD-10-CM | POA: Diagnosis present

## 2020-05-13 DIAGNOSIS — K631 Perforation of intestine (nontraumatic): Secondary | ICD-10-CM | POA: Diagnosis not present

## 2020-05-13 DIAGNOSIS — Z7189 Other specified counseling: Secondary | ICD-10-CM | POA: Diagnosis not present

## 2020-05-13 DIAGNOSIS — F418 Other specified anxiety disorders: Secondary | ICD-10-CM | POA: Diagnosis present

## 2020-05-13 DIAGNOSIS — I10 Essential (primary) hypertension: Secondary | ICD-10-CM | POA: Diagnosis present

## 2020-05-13 DIAGNOSIS — E43 Unspecified severe protein-calorie malnutrition: Secondary | ICD-10-CM | POA: Diagnosis present

## 2020-05-13 DIAGNOSIS — K828 Other specified diseases of gallbladder: Secondary | ICD-10-CM | POA: Diagnosis present

## 2020-05-13 DIAGNOSIS — E872 Acidosis: Secondary | ICD-10-CM | POA: Diagnosis present

## 2020-05-13 DIAGNOSIS — E870 Hyperosmolality and hypernatremia: Secondary | ICD-10-CM | POA: Diagnosis not present

## 2020-05-13 DIAGNOSIS — F411 Generalized anxiety disorder: Secondary | ICD-10-CM | POA: Diagnosis present

## 2020-05-13 DIAGNOSIS — W06XXXA Fall from bed, initial encounter: Secondary | ICD-10-CM | POA: Diagnosis present

## 2020-05-13 DIAGNOSIS — N170 Acute kidney failure with tubular necrosis: Secondary | ICD-10-CM | POA: Diagnosis not present

## 2020-05-13 DIAGNOSIS — E876 Hypokalemia: Secondary | ICD-10-CM | POA: Diagnosis not present

## 2020-05-13 DIAGNOSIS — B9689 Other specified bacterial agents as the cause of diseases classified elsewhere: Secondary | ICD-10-CM | POA: Diagnosis present

## 2020-05-13 DIAGNOSIS — K5792 Diverticulitis of intestine, part unspecified, without perforation or abscess without bleeding: Secondary | ICD-10-CM | POA: Diagnosis present

## 2020-05-13 DIAGNOSIS — J9811 Atelectasis: Secondary | ICD-10-CM | POA: Diagnosis present

## 2020-05-13 DIAGNOSIS — Z9842 Cataract extraction status, left eye: Secondary | ICD-10-CM

## 2020-05-13 DIAGNOSIS — I272 Pulmonary hypertension, unspecified: Secondary | ICD-10-CM | POA: Diagnosis present

## 2020-05-13 DIAGNOSIS — R9389 Abnormal findings on diagnostic imaging of other specified body structures: Secondary | ICD-10-CM

## 2020-05-13 DIAGNOSIS — E875 Hyperkalemia: Secondary | ICD-10-CM | POA: Diagnosis not present

## 2020-05-13 DIAGNOSIS — I5033 Acute on chronic diastolic (congestive) heart failure: Secondary | ICD-10-CM | POA: Diagnosis present

## 2020-05-13 DIAGNOSIS — Z20822 Contact with and (suspected) exposure to covid-19: Secondary | ICD-10-CM | POA: Diagnosis present

## 2020-05-13 DIAGNOSIS — Z888 Allergy status to other drugs, medicaments and biological substances status: Secondary | ICD-10-CM

## 2020-05-13 DIAGNOSIS — R570 Cardiogenic shock: Secondary | ICD-10-CM | POA: Diagnosis not present

## 2020-05-13 DIAGNOSIS — S065XAA Traumatic subdural hemorrhage with loss of consciousness status unknown, initial encounter: Secondary | ICD-10-CM | POA: Diagnosis present

## 2020-05-13 DIAGNOSIS — R6521 Severe sepsis with septic shock: Secondary | ICD-10-CM | POA: Diagnosis present

## 2020-05-13 DIAGNOSIS — S7001XA Contusion of right hip, initial encounter: Secondary | ICD-10-CM | POA: Diagnosis present

## 2020-05-13 DIAGNOSIS — D649 Anemia, unspecified: Secondary | ICD-10-CM | POA: Diagnosis present

## 2020-05-13 DIAGNOSIS — Z6826 Body mass index (BMI) 26.0-26.9, adult: Secondary | ICD-10-CM

## 2020-05-13 DIAGNOSIS — N179 Acute kidney failure, unspecified: Secondary | ICD-10-CM | POA: Diagnosis present

## 2020-05-13 DIAGNOSIS — F0390 Unspecified dementia without behavioral disturbance: Secondary | ICD-10-CM | POA: Diagnosis present

## 2020-05-13 DIAGNOSIS — R140 Abdominal distension (gaseous): Secondary | ICD-10-CM

## 2020-05-13 DIAGNOSIS — E1129 Type 2 diabetes mellitus with other diabetic kidney complication: Secondary | ICD-10-CM | POA: Diagnosis present

## 2020-05-13 DIAGNOSIS — B961 Klebsiella pneumoniae [K. pneumoniae] as the cause of diseases classified elsewhere: Secondary | ICD-10-CM | POA: Diagnosis present

## 2020-05-13 DIAGNOSIS — I130 Hypertensive heart and chronic kidney disease with heart failure and stage 1 through stage 4 chronic kidney disease, or unspecified chronic kidney disease: Secondary | ICD-10-CM | POA: Diagnosis present

## 2020-05-13 DIAGNOSIS — E1122 Type 2 diabetes mellitus with diabetic chronic kidney disease: Secondary | ICD-10-CM | POA: Diagnosis present

## 2020-05-13 DIAGNOSIS — I248 Other forms of acute ischemic heart disease: Secondary | ICD-10-CM | POA: Diagnosis present

## 2020-05-13 DIAGNOSIS — Z7984 Long term (current) use of oral hypoglycemic drugs: Secondary | ICD-10-CM

## 2020-05-13 DIAGNOSIS — Z825 Family history of asthma and other chronic lower respiratory diseases: Secondary | ICD-10-CM

## 2020-05-13 DIAGNOSIS — Z818 Family history of other mental and behavioral disorders: Secondary | ICD-10-CM

## 2020-05-13 DIAGNOSIS — Z95828 Presence of other vascular implants and grafts: Secondary | ICD-10-CM

## 2020-05-13 LAB — CBC WITH DIFFERENTIAL/PLATELET
Abs Immature Granulocytes: 0 10*3/uL (ref 0.00–0.07)
Band Neutrophils: 34 %
Basophils Absolute: 0 10*3/uL (ref 0.0–0.1)
Basophils Relative: 0 %
Eosinophils Absolute: 0 10*3/uL (ref 0.0–0.5)
Eosinophils Relative: 0 %
HCT: 29.5 % — ABNORMAL LOW (ref 36.0–46.0)
Hemoglobin: 9.4 g/dL — ABNORMAL LOW (ref 12.0–15.0)
Lymphocytes Relative: 26 %
Lymphs Abs: 1.7 10*3/uL (ref 0.7–4.0)
MCH: 29.6 pg (ref 26.0–34.0)
MCHC: 31.9 g/dL (ref 30.0–36.0)
MCV: 92.8 fL (ref 80.0–100.0)
Monocytes Absolute: 0.1 10*3/uL (ref 0.1–1.0)
Monocytes Relative: 1 %
Neutro Abs: 4.9 10*3/uL (ref 1.7–7.7)
Neutrophils Relative %: 39 %
Platelets: 385 10*3/uL (ref 150–400)
RBC: 3.18 MIL/uL — ABNORMAL LOW (ref 3.87–5.11)
RDW: 15.2 % (ref 11.5–15.5)
Smear Review: NORMAL
WBC: 6.7 10*3/uL (ref 4.0–10.5)
nRBC: 0 % (ref 0.0–0.2)

## 2020-05-13 LAB — BRAIN NATRIURETIC PEPTIDE: B Natriuretic Peptide: 711.4 pg/mL — ABNORMAL HIGH (ref 0.0–100.0)

## 2020-05-13 LAB — URINALYSIS, ROUTINE W REFLEX MICROSCOPIC
Bilirubin Urine: NEGATIVE
Glucose, UA: NEGATIVE mg/dL
Hgb urine dipstick: NEGATIVE
Ketones, ur: 5 mg/dL — AB
Leukocytes,Ua: NEGATIVE
Nitrite: NEGATIVE
Protein, ur: NEGATIVE mg/dL
Specific Gravity, Urine: 1.026 (ref 1.005–1.030)
pH: 5 (ref 5.0–8.0)

## 2020-05-13 LAB — COMPREHENSIVE METABOLIC PANEL
ALT: 13 U/L (ref 0–44)
AST: 17 U/L (ref 15–41)
Albumin: 1.8 g/dL — ABNORMAL LOW (ref 3.5–5.0)
Alkaline Phosphatase: 62 U/L (ref 38–126)
Anion gap: 16 — ABNORMAL HIGH (ref 5–15)
BUN: 49 mg/dL — ABNORMAL HIGH (ref 8–23)
CO2: 20 mmol/L — ABNORMAL LOW (ref 22–32)
Calcium: 8.7 mg/dL — ABNORMAL LOW (ref 8.9–10.3)
Chloride: 102 mmol/L (ref 98–111)
Creatinine, Ser: 1.52 mg/dL — ABNORMAL HIGH (ref 0.44–1.00)
GFR calc Af Amer: 36 mL/min — ABNORMAL LOW (ref >60)
GFR calc non Af Amer: 31 mL/min — ABNORMAL LOW (ref >60)
Glucose, Bld: 201 mg/dL — ABNORMAL HIGH (ref 70–99)
Potassium: 5.4 mmol/L — ABNORMAL HIGH (ref 3.5–5.1)
Sodium: 138 mmol/L (ref 135–145)
Total Bilirubin: 1.5 mg/dL — ABNORMAL HIGH (ref 0.3–1.2)
Total Protein: 5.1 g/dL — ABNORMAL LOW (ref 6.5–8.1)

## 2020-05-13 LAB — BLOOD GAS, VENOUS
Acid-base deficit: 5.3 mmol/L — ABNORMAL HIGH (ref 0.0–2.0)
Bicarbonate: 20 mmol/L (ref 20.0–28.0)
O2 Saturation: 73.9 %
Patient temperature: 37
pCO2, Ven: 37 mmHg — ABNORMAL LOW (ref 44.0–60.0)
pH, Ven: 7.34 (ref 7.250–7.430)
pO2, Ven: 42 mmHg (ref 32.0–45.0)

## 2020-05-13 LAB — POTASSIUM: Potassium: 5.1 mmol/L (ref 3.5–5.1)

## 2020-05-13 LAB — LACTIC ACID, PLASMA
Lactic Acid, Venous: 2.8 mmol/L (ref 0.5–1.9)
Lactic Acid, Venous: 3.2 mmol/L (ref 0.5–1.9)
Lactic Acid, Venous: 3.1 mmol/L (ref 0.5–1.9)

## 2020-05-13 LAB — SARS CORONAVIRUS 2 BY RT PCR (HOSPITAL ORDER, PERFORMED IN ~~LOC~~ HOSPITAL LAB): SARS Coronavirus 2: NEGATIVE

## 2020-05-13 LAB — PROCALCITONIN: Procalcitonin: 14.83 ng/mL

## 2020-05-13 LAB — PROTIME-INR
INR: 1.1 (ref 0.8–1.2)
Prothrombin Time: 13.3 seconds (ref 11.4–15.2)

## 2020-05-13 LAB — TROPONIN I (HIGH SENSITIVITY)
Troponin I (High Sensitivity): 83 ng/L — ABNORMAL HIGH (ref <18)
Troponin I (High Sensitivity): 66 ng/L — ABNORMAL HIGH (ref <18)
Troponin I (High Sensitivity): 49 ng/L — ABNORMAL HIGH (ref <18)
Troponin I (High Sensitivity): 133 ng/L (ref <18)

## 2020-05-13 LAB — APTT: aPTT: 35 seconds (ref 24–36)

## 2020-05-13 LAB — AMMONIA: Ammonia: <9 umol/L — ABNORMAL LOW (ref 9–35)

## 2020-05-13 LAB — GLUCOSE, CAPILLARY
Glucose-Capillary: 194 mg/dL — ABNORMAL HIGH (ref 70–99)
Glucose-Capillary: 197 mg/dL — ABNORMAL HIGH (ref 70–99)

## 2020-05-13 MED ORDER — LACTATED RINGERS IV BOLUS
1000.0000 mL | Freq: Once | INTRAVENOUS | Status: AC
  Administered 2020-05-13: 1000 mL via INTRAVENOUS

## 2020-05-13 MED ORDER — SODIUM CHLORIDE 0.9 % IV BOLUS
500.0000 mL | Freq: Once | INTRAVENOUS | Status: AC
  Administered 2020-05-13: 500 mL via INTRAVENOUS

## 2020-05-13 MED ORDER — VANCOMYCIN HCL IN DEXTROSE 1-5 GM/200ML-% IV SOLN
1000.0000 mg | Freq: Once | INTRAVENOUS | Status: AC
  Administered 2020-05-13: 1000 mg via INTRAVENOUS
  Filled 2020-05-13: qty 200

## 2020-05-13 MED ORDER — NOREPINEPHRINE 4 MG/250ML-% IV SOLN: 0.0000 ug/min | INTRAVENOUS | Status: DC

## 2020-05-13 MED ORDER — SODIUM CHLORIDE 0.9 % IV SOLN
250.0000 mL | INTRAVENOUS | Status: DC
  Administered 2020-05-13: 250 mL via INTRAVENOUS

## 2020-05-13 MED ORDER — IOHEXOL 350 MG/ML SOLN
60.0000 mL | Freq: Once | INTRAVENOUS | Status: AC | PRN
  Administered 2020-05-13: 60 mL via INTRAVENOUS

## 2020-05-13 MED ORDER — METRONIDAZOLE IN NACL 5-0.79 MG/ML-% IV SOLN: 500.0000 mg | Freq: Two times a day (BID) | INTRAVENOUS | Status: DC

## 2020-05-13 MED ORDER — METRONIDAZOLE IN NACL 5-0.79 MG/ML-% IV SOLN
500.0000 mg | Freq: Once | INTRAVENOUS | Status: AC
  Administered 2020-05-13: 500 mg via INTRAVENOUS
  Filled 2020-05-13: qty 100

## 2020-05-13 MED ORDER — ONDANSETRON HCL 4 MG/2ML IJ SOLN: 4.0000 mg | Freq: Three times a day (TID) | INTRAMUSCULAR | Status: DC | PRN

## 2020-05-13 MED ORDER — PIPERACILLIN-TAZOBACTAM 3.375 G IVPB: 3.3750 g | Freq: Two times a day (BID) | INTRAVENOUS | Status: DC

## 2020-05-13 MED ORDER — NOREPINEPHRINE 4 MG/250ML-% IV SOLN
2.0000 ug/min | INTRAVENOUS | Status: DC
  Administered 2020-05-13: 2 ug/min via INTRAVENOUS
  Administered 2020-05-13 – 2020-05-14 (×2): 6 ug/min via INTRAVENOUS
  Filled 2020-05-13 (×3): qty 250

## 2020-05-13 MED ORDER — INSULIN ASPART 100 UNIT/ML ~~LOC~~ SOLN: 0.0000 [IU] | Freq: Every day | SUBCUTANEOUS | Status: DC

## 2020-05-13 MED ORDER — INSULIN ASPART 100 UNIT/ML ~~LOC~~ SOLN
0.0000 [IU] | Freq: Three times a day (TID) | SUBCUTANEOUS | Status: DC
  Administered 2020-05-14: 1 [IU] via SUBCUTANEOUS
  Administered 2020-05-14 (×2): 2 [IU] via SUBCUTANEOUS
  Administered 2020-05-15: 1 [IU] via SUBCUTANEOUS
  Administered 2020-05-15: 2 [IU] via SUBCUTANEOUS
  Administered 2020-05-15: 1 [IU] via SUBCUTANEOUS
  Administered 2020-05-16 (×2): 2 [IU] via SUBCUTANEOUS
  Administered 2020-05-16: 1 [IU] via SUBCUTANEOUS
  Administered 2020-05-17: 3 [IU] via SUBCUTANEOUS
  Administered 2020-05-17 (×2): 2 [IU] via SUBCUTANEOUS
  Administered 2020-05-18: 3 [IU] via SUBCUTANEOUS
  Filled 2020-05-13 (×13): qty 1

## 2020-05-13 MED ORDER — SODIUM CHLORIDE 0.9 % IV SOLN
2.0000 g | Freq: Once | INTRAVENOUS | Status: AC
  Administered 2020-05-13: 2 g via INTRAVENOUS
  Filled 2020-05-13: qty 2

## 2020-05-13 MED ORDER — ALBUMIN HUMAN 25 % IV SOLN
12.5000 g | Freq: Once | INTRAVENOUS | Status: AC
  Administered 2020-05-13: 12.5 g via INTRAVENOUS
  Filled 2020-05-13: qty 50

## 2020-05-13 MED ORDER — SODIUM CHLORIDE 0.9 % IV SOLN: INTRAVENOUS | Status: DC

## 2020-05-13 MED ORDER — ACETAMINOPHEN 650 MG RE SUPP: 650.0000 mg | Freq: Four times a day (QID) | RECTAL | Status: DC | PRN

## 2020-05-13 MED ORDER — ACETAMINOPHEN 10 MG/ML IV SOLN
1000.0000 mg | Freq: Four times a day (QID) | INTRAVENOUS | Status: DC
  Administered 2020-05-13 (×2): 1000 mg via INTRAVENOUS
  Filled 2020-05-13 (×4): qty 100

## 2020-05-13 MED ORDER — PIPERACILLIN-TAZOBACTAM 3.375 G IVPB
3.3750 g | Freq: Two times a day (BID) | INTRAVENOUS | Status: DC
  Administered 2020-05-13: 3.375 g via INTRAVENOUS
  Filled 2020-05-13 (×3): qty 50

## 2020-05-13 MED ORDER — FUROSEMIDE 10 MG/ML IJ SOLN
20.0000 mg | Freq: Once | INTRAMUSCULAR | Status: AC
  Administered 2020-05-13: 20 mg via INTRAVENOUS
  Filled 2020-05-13: qty 4

## 2020-05-13 MED ORDER — CHLORHEXIDINE GLUCONATE CLOTH 2 % EX PADS
6.0000 | MEDICATED_PAD | Freq: Every day | CUTANEOUS | Status: DC
  Administered 2020-05-14 – 2020-05-18 (×5): 6 via TOPICAL

## 2020-05-13 MED ORDER — ACETAMINOPHEN 325 MG PO TABS: 650.0000 mg | ORAL_TABLET | Freq: Four times a day (QID) | ORAL | Status: DC | PRN

## 2020-05-13 NOTE — Consult Note (Signed)
PHARMACY -  BRIEF ANTIBIOTIC NOTE   Pharmacy has received consult(s) for Vancomycin/Cefepime from an ED provider.    The patient's profile has been reviewed for ht/wt/allergies/indication/available labs.    One time order(s) placed for Vancomycin 1g IV x 1 and Cefepime 2g IV x 1  Further antibiotics/pharmacy consults should be ordered by admitting physician if indicated.                       Thank you,  Albina Billet, PharmD, BCPS Clinical Pharmacist 05/05/2020 10:14 AM

## 2020-05-13 NOTE — Consult Note (Signed)
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  I spoke to Niece Andreas Ohm who states she was unaware of CVA, CKD, severe malnutrition, was under impression that patient is doing well in SNF and at this time wishes to continue full scope of care.    Lines / Drains: PIV  Cultures / Sepsis markers: Blood cx  Antibiotics: Flagyl, cefepime, zosyn   Protocols / Consultants: hospitalist , pccm, surgery  Tests / Events: CT PE, CT abd   PAST MEDICAL HISTORY   Past Medical History:  Diagnosis Date  . Allergy   . Anxiety   . Arthritis   . Diabetes mellitus without complication (HCC)   . Hypertension      SURGICAL HISTORY   Past Surgical History:  Procedure Laterality Date  . CATARACT EXTRACTION W/PHACO Left 04/12/2016   Procedure: CATARACT EXTRACTION PHACO AND INTRAOCULAR LENS PLACEMENT (IOC);  Surgeon: Sallee Lange, MD;  Location: [facility] ORS;  Service: Ophthalmology;  Laterality: Left;  [place]  02:06 AP% 26.8 CDE 55.05 fluid pack lot # [id number]     FAMILY HISTORY   Family History  Problem Relation Age of Onset  . Asthma Mother   . Anxiety disorder Mother   . Lung disease Mother   . COPD Sister      SOCIAL HISTORY   Social History   Tobacco Use  . Smoking status: Never Smoker  . Smokeless tobacco: Never Used  Vaping Use  . Vaping Use: Never used  Substance Use Topics  . Alcohol use: Yes    Comment: occasional  . Drug use: No      MEDICATIONS   Current Medication:  Current Facility-Administered Medications:  .  0.9 %  sodium chloride infusion, , Intravenous, Continuous, Lorretta Harp, MD, Last Rate: 75 mL/hr at 05/01/2020 1312, New Bag at 04/22/2020 1312 .  0.9 %  sodium chloride infusion, 250 mL, Intravenous, Continuous, Patel, Charlotte S, RPH .  acetaminophen (OFIRMEV) IV 1,000 mg, 1,000 mg, Intravenous, Q6H, Concha Se, MD, Stopped at 04/27/2020 1244 .  norepinephrine (LEVOPHED)  in premix infusion, 2-10 mcg/min, Intravenous, Titrated, Patel, Kishan S, RPH .  ondansetron (ZOFRAN) injection 4 mg, 4 mg, Intravenous, Q8H PRN, Lorretta Harp, MD .  piperacillin-tazobactam (ZOSYN) IVPB 3.375 g, 3.375 g, Intravenous, Q12H, Lorretta Harp, MD  Current Outpatient Medications:  .  acetaminophen (TYLENOL) 500 MG tablet, Take 2 tablets (1,000 mg total) by mouth 3 (three) times daily., Disp: 30 tablet, Rfl: 0 .  ALPRAZolam (XANAX) 0.5 MG tablet, Take 0.5-1 tablets (0.25-0.5 mg total) by mouth 2 (two) times daily as needed for anxiety. TAKE 1/2 TO 1 TABLET BY MOUTH TWICE DAILY AS NEEDED ANXIETY, Disp: 10 tablet, Rfl: 0 .  augmented betamethasone dipropionate (DIPROLENE-AF) 0.05 % cream, APPLY TO AFFECTED AREA(s) TWICE DAILY, Disp: 50 g, Rfl: 5 .  bisoprolol-hydrochlorothiazide (ZIAC) 10-6.25 MG tablet, Take 1 tablet by mouth daily., Disp: 90 tablet, Rfl: 3 .  Boswellia-Glucosamine-Vit D (GLUCOSAMINE COMPLEX PO), Take by mouth daily as needed. 1500, Disp: , Rfl:  .  cholecalciferol (VITAMIN D) 1000 UNITS tablet, Take 1,000 Units by  mouth. , Disp: , Rfl:  .  cyclobenzaprine (FLEXERIL) 5 MG tablet, Take 1 tablet (5 mg total) by mouth at bedtime., Disp: 10 tablet, Rfl: 0 .  DULoxetine (CYMBALTA) 20 MG capsule, Take 1 capsule (20 mg total) by mouth daily., Disp: 30 capsule, Rfl: 0 .  gabapentin (NEURONTIN) 300 MG capsule, Take 1 capsule (300 mg total) by mouth 2 (two) times daily., Disp: 30 capsule, Rfl: 0 .  metFORMIN (GLUCOPHAGE)  500 MG tablet, Take 1 tablet (500 mg total) by mouth 2 (two) times daily., Disp: 180 tablet, Rfl: 3 .  OMEGA-3 FATTY ACIDS PO, Take by mouth. , Disp: , Rfl:  .  oxyCODONE (OXY IR/ROXICODONE) 5 MG immediate release tablet, Take 1 tablet (5 mg total) by mouth every 8 (eight) hours as needed for moderate pain., Disp: 14 tablet, Rfl: 0 .  senna-docusate (SENOKOT-S) 8.6-50 MG tablet, Take 1 tablet by mouth 2 (two) times daily., Disp: 20 tablet, Rfl: 0 .  triamcinolone cream (KENALOG) 0.1 %, APPLY TO AFFECTED AREA(s) TWICE DAILY, Disp: 454 g, Rfl: 1 .  vitamin B-12 (CYANOCOBALAMIN) 1000 MCG tablet, Take 1 tablet (1,000 mcg total) by mouth daily., Disp: 1 tablet, Rfl: 0 .  VITAMIN E-1000 PO, Take by mouth. , Disp: , Rfl:     ALLERGIES   Tetracycline    REVIEW OF SYSTEMS    Unable to obtain due to severe encephalopathy  PHYSICAL EXAMINATION   Vital Signs: Temp:  [100.8 F (38.2 C)] 100.8 F (38.2 C) (06/22 0945) Pulse Rate:  [54-81] 63 (06/22 1430) Resp:  [16-32] 16 (06/22 1430) BP: (77-133)/(38-63) 124/53 (06/22 1430) SpO2:  [90 %-100 %] 100 % (06/22 1430) Weight:  [50.6 kg] 50.6 kg (06/22 0947)  GENERAL:chronically ill appearing HEAD: Normocephalic, atraumatic.  EYES: Pupils equal, round, reactive to light.  No scleral icterus.  MOUTH: Moist mucosal membrane. NECK: Supple. No thyromegaly. No nodules. No JVD.  PULMONARY: decreased BS bilaterally  CARDIOVASCULAR: S1 and S2. Regular rate and rhythm. No murmurs, rubs, or gallops.  GASTROINTESTINAL: Soft, nontender, non-distended. No masses. Positive bowel sounds. No hepatosplenomegaly.  MUSCULOSKELETAL:upper and lowe extermity edema + NEUROLOGIC:GCS9 SKIN:intact,warm,dry   PERTINENT DATA     Infusions: . sodium chloride 75 mL/hr at 06-03-2020 1312  . sodium chloride    . acetaminophen Stopped (2020-06-03 1244)  . norepinephrine (LEVOPHED) Adult infusion    . piperacillin-tazobactam (ZOSYN)  IV     Scheduled  Medications:  PRN Medications: ondansetron (ZOFRAN) IV Hemodynamic parameters:   Intake/Output: No intake/output data recorded.  Ventilator  Settings:     LAB RESULTS:  Basic Metabolic Panel: Recent Labs  Lab 05/08/20 0528 05/08/20 0528 05/09/20 0432 2020/06/03 0940  NA 137  --  136 138  K 4.1   < > 4.2 5.4*  CL 102  --  102 102  CO2 27  --  28 20*  GLUCOSE 152*  --  191* 201*  BUN 30*  --  28* 49*  CREATININE 0.79  --  0.84 1.52*  CALCIUM 8.9  --  8.7* 8.7*  MG 2.0  --  2.1  --    < > = values in this interval not displayed.   Liver Function Tests: Recent Labs  Lab 2020/06/03 0940  AST 17  ALT 13  ALKPHOS 62  BILITOT 1.5*  PROT 5.1*  ALBUMIN 1.8*   No results for input(s): LIPASE, AMYLASE in the last 168 hours. Recent Labs  Lab 2020/06/03 1101  AMMONIA <9*   CBC: Recent Labs  Lab 05/08/20 0528 05/09/20 0432 2020-06-12 0940  WBC 10.1 9.7 6.7  NEUTROABS 6.9 7.2 4.9  HGB 9.4* 9.0* 9.4*  HCT 29.6* 28.2* 29.5*  MCV 93.1 93.4 92.8  PLT 227 239 385   Cardiac Enzymes: No results for input(s): CKTOTAL, CKMB, CKMBINDEX, TROPONINI in the last 168 hours. BNP: Invalid input(s): POCBNP CBG: Recent Labs  Lab 05/08/20 2113 05/09/20 0836 05/09/20 1228 05/09/20 1700 2020-06-12 0940  GLUCAP 224* 155* 127* 110* 194*       IMAGING RESULTS:  Imaging: CT Head Wo Contrast  Result Date: 06/12/2020 CLINICAL DATA:  Altered mental status of unclear cause EXAM: CT HEAD WITHOUT CONTRAST TECHNIQUE: Contiguous axial images were obtained from the base of the skull through the vertex without intravenous contrast. COMPARISON:  04/22/2020 FINDINGS: Brain: Generalized atrophy. Normal ventricular morphology. No midline shift or mass effect. Extensive small vessel chronic ischemic changes of deep cerebral white matter. Old infarcts at the deep RIGHT frontal white matter, basal ganglia and BILATERAL cerebellar hemispheres. Few scattered streak artifacts. Resolution of trace RIGHT  frontal subdural hematoma seen on previous exam. 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 No intracranial hemorrhage, mass lesion or evidence of acute infarction. Vascular: Atherosclerotic calcification of internal carotid and vertebral arteries at skull base Skull: Intact Sinuses/Orbits: Partial opacification of LEFT mastoid air cells, new. Remaining visualized paranasal sinuses and RIGHT mastoid air cells clear. Other: N/A IMPRESSION: Atrophy with small vessel chronic ischemic changes of deep cerebral white matter. Resolution of trace RIGHT frontal subdural hematoma seen on previous exam with residual small BILATERAL chronic subdural hygromas in the frontal regions bilaterally. 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 No new intracranial abnormalities. Electronically Signed   By: Ulyses Southward M.D.   On: 2020-06-12 11:23   CT Angio Chest PE W and/or Wo Contrast  Result Date: 06-12-2020 CLINICAL DATA:  Significant shortness of breath, history of known deep venous thrombosis and recent filter placement EXAM: CT ANGIOGRAPHY CHEST CT ABDOMEN AND PELVIS WITH CONTRAST TECHNIQUE: Multidetector CT imaging of the chest was performed using the standard protocol during bolus administration of intravenous contrast. Multiplanar CT image reconstructions and MIPs were obtained to evaluate the vascular anatomy. Multidetector CT imaging of the abdomen and pelvis was performed using the standard protocol during bolus administration of intravenous contrast. CONTRAST:  23mL OMNIPAQUE IOHEXOL 350 MG/ML SOLN COMPARISON:  05/04/2019 FINDINGS: CTA CHEST FINDINGS Cardiovascular: Thoracic aorta demonstrates mild atherosclerotic calcification without aneurysmal dilatation or dissection. Cardiac enlargement is noted. The pulmonary artery shows a normal branching pattern bilaterally. No sizable filling defects to suggest pulmonary emboli are seen. Coronary  calcifications are noted. No sizable pericardial effusion is seen. Mediastinum/Nodes: Thoracic inlet is within normal limits. No sizable hilar or mediastinal adenopathy is noted. The esophagus is within normal limits. Lungs/Pleura: Small bilateral pleural effusions are noted right greater than left with mild associated compressive atelectasis again right greater than left. The lungs are otherwise well aerated without focal infiltrate. No sizable parenchymal nodules are seen. No pneumothoraces are noted. Musculoskeletal: Degenerative changes of the thoracic spine are noted. No acute bony abnormality is seen. Review of the MIP images confirms the above findings. CT ABDOMEN and PELVIS FINDINGS Hepatobiliary: Liver is well visualized without focal mass lesion. The gallbladder is well distended although no gallstones are noted. Pancreas: Unremarkable. No pancreatic ductal dilatation or surrounding inflammatory changes. Spleen: Normal in size without focal abnormality. Adrenals/Urinary Tract: Adrenal glands are within normal limits. Kidneys demonstrate a normal  enhancement pattern. No renal calculi or urinary tract obstructive changes are seen. Small left renal cyst is noted. The bladder is well distended. Small focus of air is noted within the bladder consistent with recent instrumentation. Stomach/Bowel: Appendix is well visualized and within normal limits. Fecal material is noted throughout the colon. The sigmoid colon is distended with changes of diverticular disease as well as focal wall thickening consistent with focal diverticulitis. Large stool burden in the sigmoid is noted which may be related to a degree of impaction. Multiple extraluminal foci of air are noted consistent with mild perforation likely related to this focal inflammatory change. Small bowel appears within normal limits with the exception of some compensatory inflammatory change related to the sigmoid changes. Stomach is unremarkable.  Vascular/Lymphatic: Atherosclerotic calcifications are noted. IVC filter is noted in place. Right-sided DVT is again noted and stable. No definitive thrombus within the IVC filter is seen. No significant lymphadenopathy is noted. Reproductive: Uterus and bilateral adnexa are unremarkable. Other: Minimal free fluid is noted related to the perforation. Minimal changes of anasarca are noted. Musculoskeletal: Degenerative changes of lumbar spine are again seen. Stable hematoma over the right hip is noted. Review of the MIP images confirms the above findings. IMPRESSION: CTA of the chest: No evidence of pulmonary embolism. Mild bibasilar atelectasis and small effusions right greater than left. CT of the abdomen and pelvis: Changes of diverticulitis in the sigmoid colon with a large focal stool burden which may represent some impaction. Associated inflammatory changes are noted as well as findings of microperforations. Small amount of free fluid is noted as well. Distended gallbladder of uncertain significance. This is stable from the prior exam. Stable hematoma over the right hip. Electronically Signed   By: Alcide Clever M.D.   On: 04/29/2020 11:42   CT ABDOMEN PELVIS W CONTRAST  Result Date: 04/25/2020 CLINICAL DATA:  Significant shortness of breath, history of known deep venous thrombosis and recent filter placement EXAM: CT ANGIOGRAPHY CHEST CT ABDOMEN AND PELVIS WITH CONTRAST TECHNIQUE: Multidetector CT imaging of the chest was performed using the standard protocol during bolus administration of intravenous contrast. Multiplanar CT image reconstructions and MIPs were obtained to evaluate the vascular anatomy. Multidetector CT imaging of the abdomen and pelvis was performed using the standard protocol during bolus administration of intravenous contrast. CONTRAST:  54mL OMNIPAQUE IOHEXOL 350 MG/ML SOLN COMPARISON:  05/04/2019 FINDINGS: CTA CHEST FINDINGS Cardiovascular: Thoracic aorta demonstrates mild  atherosclerotic calcification without aneurysmal dilatation or dissection. Cardiac enlargement is noted. The pulmonary artery shows a normal branching pattern bilaterally. No sizable filling defects to suggest pulmonary emboli are seen. Coronary calcifications are noted. No sizable pericardial effusion is seen. Mediastinum/Nodes: Thoracic inlet is within normal limits. No sizable hilar or mediastinal adenopathy is noted. The esophagus is within normal limits. Lungs/Pleura: Small bilateral pleural effusions are noted right greater than left with mild associated compressive atelectasis again right greater than left. The lungs are otherwise well aerated without focal infiltrate. No sizable parenchymal nodules are seen. No pneumothoraces are noted. Musculoskeletal: Degenerative changes of the thoracic spine are noted. No acute bony abnormality is seen. Review of the MIP images confirms the above findings. CT ABDOMEN and PELVIS FINDINGS Hepatobiliary: Liver is well visualized without focal mass lesion. The gallbladder is well distended although no gallstones are noted. Pancreas: Unremarkable. No pancreatic ductal dilatation or surrounding inflammatory changes. Spleen: Normal in size without focal abnormality. Adrenals/Urinary Tract: Adrenal glands are within normal limits. Kidneys demonstrate a normal enhancement pattern. No renal  calculi or urinary tract obstructive changes are seen. Small left renal cyst is noted. The bladder is well distended. Small focus of air is noted within the bladder consistent with recent instrumentation. Stomach/Bowel: Appendix is well visualized and within normal limits. Fecal material is noted throughout the colon. The sigmoid colon is distended with changes of diverticular disease as well as focal wall thickening consistent with focal diverticulitis. Large stool burden in the sigmoid is noted which may be related to a degree of impaction. Multiple extraluminal foci of air are noted  consistent with mild perforation likely related to this focal inflammatory change. Small bowel appears within normal limits with the exception of some compensatory inflammatory change related to the sigmoid changes. Stomach is unremarkable. Vascular/Lymphatic: Atherosclerotic calcifications are noted. IVC filter is noted in place. Right-sided DVT is again noted and stable. No definitive thrombus within the IVC filter is seen. No significant lymphadenopathy is noted. Reproductive: Uterus and bilateral adnexa are unremarkable. Other: Minimal free fluid is noted related to the perforation. Minimal changes of anasarca are noted. Musculoskeletal: Degenerative changes of lumbar spine are again seen. Stable hematoma over the right hip is noted. Review of the MIP images confirms the above findings. IMPRESSION: CTA of the chest: No evidence of pulmonary embolism. Mild bibasilar atelectasis and small effusions right greater than left. CT of the abdomen and pelvis: Changes of diverticulitis in the sigmoid colon with a large focal stool burden which may represent some impaction. Associated inflammatory changes are noted as well as findings of microperforations. Small amount of free fluid is noted as well. Distended gallbladder of uncertain significance. This is stable from the prior exam. Stable hematoma over the right hip. Electronically Signed   By: Inez Catalina M.D.   On: 05/01/2020 11:42   DG Chest Portable 1 View  Result Date: 05/08/2020 CLINICAL DATA:  Severe shortness of breath EXAM: PORTABLE CHEST 1 VIEW COMPARISON:  Portable exam 0956 hours compared to 04/20/2020 FINDINGS: Enlargement of cardiac silhouette. Mediastinal contours and pulmonary vascularity normal. Atherosclerotic calcification aorta. Minimal bibasilar opacities question atelectasis versus early infiltrate. Remaining lungs clear. No pleural effusion or pneumothorax. Bones demineralized. IMPRESSION: Enlargement of cardiac silhouette. Minimal bibasilar  opacities question atelectasis versus infiltrate. Electronically Signed   By: Lavonia Dana M.D.   On: 04/28/2020 10:33   @[facility] @ CT Head Wo Contrast  Result Date: 04/26/2020 CLINICAL DATA:  Altered mental status of unclear cause EXAM: CT HEAD WITHOUT CONTRAST TECHNIQUE: Contiguous axial images were obtained from the base of the skull through the vertex without intravenous contrast. COMPARISON:  04/22/2020 FINDINGS: Brain: Generalized atrophy. Normal ventricular morphology. No midline shift or mass effect. Extensive small vessel chronic ischemic changes of deep cerebral white matter. Old infarcts at the deep RIGHT frontal white matter, basal ganglia and BILATERAL cerebellar hemispheres. Few scattered streak artifacts. Resolution of trace RIGHT frontal subdural hematoma seen on previous exam. 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 No intracranial hemorrhage, mass lesion or evidence of acute infarction. Vascular: Atherosclerotic calcification of internal carotid and vertebral arteries at skull base Skull: Intact Sinuses/Orbits: Partial opacification of LEFT mastoid air cells, new. Remaining visualized paranasal sinuses and RIGHT mastoid air cells clear. Other: N/A IMPRESSION: Atrophy with small vessel chronic ischemic changes of deep cerebral white matter. Resolution of trace RIGHT frontal subdural hematoma seen on previous exam with residual small BILATERAL chronic subdural hygromas in the frontal regions bilaterally. 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 No new intracranial abnormalities.  Electronically Signed   By: Ulyses Southward M.D.   On: 05/15/2020 11:23   CT Angio Chest PE W and/or Wo Contrast  Result Date: 04/28/2020 CLINICAL DATA:  Significant shortness of breath, history of known deep venous thrombosis and recent filter placement EXAM: CT ANGIOGRAPHY CHEST CT ABDOMEN AND PELVIS WITH CONTRAST TECHNIQUE:  Multidetector CT imaging of the chest was performed using the standard protocol during bolus administration of intravenous contrast. Multiplanar CT image reconstructions and MIPs were obtained to evaluate the vascular anatomy. Multidetector CT imaging of the abdomen and pelvis was performed using the standard protocol during bolus administration of intravenous contrast. CONTRAST:  60mL OMNIPAQUE IOHEXOL 350 MG/ML SOLN COMPARISON:  05/04/2019 FINDINGS: CTA CHEST FINDINGS Cardiovascular: Thoracic aorta demonstrates mild atherosclerotic calcification without aneurysmal dilatation or dissection. Cardiac enlargement is noted. The pulmonary artery shows a normal branching pattern bilaterally. No sizable filling defects to suggest pulmonary emboli are seen. Coronary calcifications are noted. No sizable pericardial effusion is seen. Mediastinum/Nodes: Thoracic inlet is within normal limits. No sizable hilar or mediastinal adenopathy is noted. The esophagus is within normal limits. Lungs/Pleura: Small bilateral pleural effusions are noted right greater than left with mild associated compressive atelectasis again right greater than left. The lungs are otherwise well aerated without focal infiltrate. No sizable parenchymal nodules are seen. No pneumothoraces are noted. Musculoskeletal: Degenerative changes of the thoracic spine are noted. No acute bony abnormality is seen. Review of the MIP images confirms the above findings. CT ABDOMEN and PELVIS FINDINGS Hepatobiliary: Liver is well visualized without focal mass lesion. The gallbladder is well distended although no gallstones are noted. Pancreas: Unremarkable. No pancreatic ductal dilatation or surrounding inflammatory changes. Spleen: Normal in size without focal abnormality. Adrenals/Urinary Tract: Adrenal glands are within normal limits. Kidneys demonstrate a normal enhancement pattern. No renal calculi or urinary tract obstructive changes are seen. Small left renal cyst  is noted. The bladder is well distended. Small focus of air is noted within the bladder consistent with recent instrumentation. Stomach/Bowel: Appendix is well visualized and within normal limits. Fecal material is noted throughout the colon. The sigmoid colon is distended with changes of diverticular disease as well as focal wall thickening consistent with focal diverticulitis. Large stool burden in the sigmoid is noted which may be related to a degree of impaction. Multiple extraluminal foci of air are noted consistent with mild perforation likely related to this focal inflammatory change. Small bowel appears within normal limits with the exception of some compensatory inflammatory change related to the sigmoid changes. Stomach is unremarkable. Vascular/Lymphatic: Atherosclerotic calcifications are noted. IVC filter is noted in place. Right-sided DVT is again noted and stable. No definitive thrombus within the IVC filter is seen. No significant lymphadenopathy is noted. Reproductive: Uterus and bilateral adnexa are unremarkable. Other: Minimal free fluid is noted related to the perforation. Minimal changes of anasarca are noted. Musculoskeletal: Degenerative changes of lumbar spine are again seen. Stable hematoma over the right hip is noted. Review of the MIP images confirms the above findings. IMPRESSION: CTA of the chest: No evidence of pulmonary embolism. Mild bibasilar atelectasis and small effusions right greater than left. CT of the abdomen and pelvis: Changes of diverticulitis in the sigmoid colon with a large focal stool burden which may represent some impaction. Associated inflammatory changes are noted as well as findings of microperforations. Small amount of free fluid is noted as well. Distended gallbladder of uncertain significance. This is stable from the prior exam. Stable hematoma over the right hip. Electronically  Signed   By: Alcide Clever M.D.   On: 04/25/2020 11:42   CT ABDOMEN PELVIS W  CONTRAST  Result Date: 05/05/2020 CLINICAL DATA:  Significant shortness of breath, history of known deep venous thrombosis and recent filter placement EXAM: CT ANGIOGRAPHY CHEST CT ABDOMEN AND PELVIS WITH CONTRAST TECHNIQUE: Multidetector CT imaging of the chest was performed using the standard protocol during bolus administration of intravenous contrast. Multiplanar CT image reconstructions and MIPs were obtained to evaluate the vascular anatomy. Multidetector CT imaging of the abdomen and pelvis was performed using the standard protocol during bolus administration of intravenous contrast. CONTRAST:  66mL OMNIPAQUE IOHEXOL 350 MG/ML SOLN COMPARISON:  05/04/2019 FINDINGS: CTA CHEST FINDINGS Cardiovascular: Thoracic aorta demonstrates mild atherosclerotic calcification without aneurysmal dilatation or dissection. Cardiac enlargement is noted. The pulmonary artery shows a normal branching pattern bilaterally. No sizable filling defects to suggest pulmonary emboli are seen. Coronary calcifications are noted. No sizable pericardial effusion is seen. Mediastinum/Nodes: Thoracic inlet is within normal limits. No sizable hilar or mediastinal adenopathy is noted. The esophagus is within normal limits. Lungs/Pleura: Small bilateral pleural effusions are noted right greater than left with mild associated compressive atelectasis again right greater than left. The lungs are otherwise well aerated without focal infiltrate. No sizable parenchymal nodules are seen. No pneumothoraces are noted. Musculoskeletal: Degenerative changes of the thoracic spine are noted. No acute bony abnormality is seen. Review of the MIP images confirms the above findings. CT ABDOMEN and PELVIS FINDINGS Hepatobiliary: Liver is well visualized without focal mass lesion. The gallbladder is well distended although no gallstones are noted. Pancreas: Unremarkable. No pancreatic ductal dilatation or surrounding inflammatory changes. Spleen: Normal in size  without focal abnormality. Adrenals/Urinary Tract: Adrenal glands are within normal limits. Kidneys demonstrate a normal enhancement pattern. No renal calculi or urinary tract obstructive changes are seen. Small left renal cyst is noted. The bladder is well distended. Small focus of air is noted within the bladder consistent with recent instrumentation. Stomach/Bowel: Appendix is well visualized and within normal limits. Fecal material is noted throughout the colon. The sigmoid colon is distended with changes of diverticular disease as well as focal wall thickening consistent with focal diverticulitis. Large stool burden in the sigmoid is noted which may be related to a degree of impaction. Multiple extraluminal foci of air are noted consistent with mild perforation likely related to this focal inflammatory change. Small bowel appears within normal limits with the exception of some compensatory inflammatory change related to the sigmoid changes. Stomach is unremarkable. Vascular/Lymphatic: Atherosclerotic calcifications are noted. IVC filter is noted in place. Right-sided DVT is again noted and stable. No definitive thrombus within the IVC filter is seen. No significant lymphadenopathy is noted. Reproductive: Uterus and bilateral adnexa are unremarkable. Other: Minimal free fluid is noted related to the perforation. Minimal changes of anasarca are noted. Musculoskeletal: Degenerative changes of lumbar spine are again seen. Stable hematoma over the right hip is noted. Review of the MIP images confirms the above findings. IMPRESSION: CTA of the chest: No evidence of pulmonary embolism. Mild bibasilar atelectasis and small effusions right greater than left. CT of the abdomen and pelvis: Changes of diverticulitis in the sigmoid colon with a large focal stool burden which may represent some impaction. Associated inflammatory changes are noted as well as findings of microperforations. Small amount of free fluid is noted  as well. Distended gallbladder of uncertain significance. This is stable from the prior exam. Stable hematoma over the right hip. Electronically Signed  By: Alcide CleverMark  Lukens M.D.   On: 04/30/2020 11:42   DG Chest Portable 1 View  Result Date: 05/01/2020 CLINICAL DATA:  Severe shortness of breath EXAM: PORTABLE CHEST 1 VIEW COMPARISON:  Portable exam 0956 hours compared to 04/20/2020 FINDINGS: Enlargement of cardiac silhouette. Mediastinal contours and pulmonary vascularity normal. Atherosclerotic calcification aorta. Minimal bibasilar opacities question atelectasis versus early infiltrate. Remaining lungs clear. No pleural effusion or pneumothorax. Bones demineralized. IMPRESSION: Enlargement of cardiac silhouette. Minimal bibasilar opacities question atelectasis versus infiltrate. Electronically Signed   By: Ulyses SouthwardMark  Boles M.D.   On: 04/24/2020 10:33     ASSESSMENT AND PLAN    -Multidisciplinary rounds held today  Septic shock - present on admission  -due to intraabdominal infection with diverticulitis -lactate >3 and procal >14 -continue current abx -use vasopressors to keep MAP>65 -follow ABG  -follow up cultures -had lengthy goals of care discussion with POA who is niece of patient Andreas Ohmerri Ross.   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 with background of diabetic nephropathy -follow chem 7 -follow UO -continue Foley Catheter-assess need daily  DVT of RLE  S/p IVC filter   ID -continue IV abx as prescibed -follow up cultures  GI/Nutrition GI PROPHYLAXIS as indicated DIET-->TF's as tolerated Constipation protocol as indicated  ENDO - ICU hypoglycemic\Hyperglycemia protocol -check FSBS per protocol   ELECTROLYTES -follow labs as needed -replace as needed -pharmacy  consultation   DVT/GI PRX ordered -SCDs  TRANSFUSIONS AS NEEDED MONITOR FSBS ASSESS the need for LABS as needed   Critical care provider statement:    Critical care time (minutes):  109   Critical care time was exclusive of:  Separately billable procedures and treating other patients   Critical care was necessary to treat or prevent imminent or life-threatening deterioration of the following conditions:  acute diverticulitis, septic shock, hx of CVA,  Hx of RLE DVT, acute renal failure on chronic renal failure,    Critical care was time spent personally by me on the following activities:  Development of treatment plan with patient or surrogate, discussions with consultants, evaluation of patient's response to treatment, examination of patient, obtaining history from patient or surrogate, ordering and performing treatments and interventions, ordering and review of laboratory studies and re-evaluation of patient's condition.  I assumed direction of critical care for this patient from another provider in my specialty: no    This document was prepared using Dragon voice recognition software and may include unintentional dictation errors.    Vida RiggerFuad Mack Thurmon, M.D.  Division of Pulmonary & Critical Care Medicine  [facility]

## 2020-05-13 NOTE — ED Notes (Signed)
Spoke with patients niece Andreas Ohm and she wanted an update on patients status.

## 2020-05-13 NOTE — H&P (Signed)
History and Physical    Sierra Lloyd ZOX:[id number] DOB: Jul 04, 1934 DOA: 05/05/2020  Referring MD/NP/PA:   PCP: Margaretann Loveless, PA-C   Patient coming from:  The patient is coming from SNF.  At baseline, pt is dependent for most of ADL.        Chief Complaint: AMS  HPI: Sierra Lloyd is a 86 y.o. female with medical history significant of hypertension, diabetes mellitus, DVT-right leg (s/p of IVC filter), subdural hematoma, CKD stage III, depression with anxiety, who presents with altered mental status.   Patient has AMS, and is unable to provide accurate medical history. I spoke to her niece by phone, but she also does not know the detailed information since patient is in nursing home, therefore, most of the history is obtained by discussing the case with ED physician, per EMS report, and with the nursing staff.  Patient was recently hospitalized from 5/30-6/18 due to subdural hematoma. During the hospitalization she was also found to have a right DVT.  Therefore she underwent IVC filter on 05/04/2020. Per report, pt was noted to be more confused and was unresponsive this AM. They were also concerned that patient had more peripheral swelling in her extremities.  Patient has obvious abdominal tenderness and abdominal distention on examination.  No active nausea vomiting, diarrhea noted.  No active respiratory distress and coughing noted.  Patient is barely arousable, does not follow command, slightly move extremities on painful stimuli.   ED Course: pt was found to have WBC 6.7, lactic acid 2.8, INR 1.01, PTT 35, BNP 711, negative urinalysis, pending COVID-19 PCR, ammonia less than 9, worsening renal function, potassium 5.4, temperature 100.8, blood pressure 108/63, heart rate 67, tachypnea, oxygen saturation 91-100 percent on home level of 2 L oxygen.  Chest x-ray showed cardiomegaly and minimal bilateral basilar opacity. CTA negatiive for PE. Patient is admitted to stepdown as  inpatient.  General surgeon, Dr. Everlene Farrier is consulted.  CT of the abdomen and pelvis:   Changes of diverticulitis in the sigmoid colon with a large focal stool burden which may represent some impaction. Associated inflammatory changes are noted as well as findings of microperforations. Small amount of free fluid is noted as well.  Distended gallbladder of uncertain significance. This is stable from the prior exam.  Stable hematoma over the right hip.  CT head showed:  Atrophy with small vessel chronic ischemic changes of deep cerebral white matter.  Resolution of trace RIGHT frontal subdural hematoma seen on previous exam with residual small BILATERAL chronic subdural hygromas in the  frontal regions bilaterally.  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  No new intracranial abnormalities  Review of Systems: Could not be reviewed accurately due to altered mental status  Allergy:  Allergies  Allergen Reactions  . Tetracycline     Roof of mouth broke out.    Past Medical History:  Diagnosis Date  . Allergy   . Anxiety   . Arthritis   . Diabetes mellitus without complication (HCC)   . Hypertension     Past Surgical History:  Procedure Laterality Date  . CATARACT EXTRACTION W/PHACO Left 04/12/2016   Procedure: CATARACT EXTRACTION PHACO AND INTRAOCULAR LENS PLACEMENT (IOC);  Surgeon: Sallee Lange, MD;  Location: [facility] ORS;  Service: Ophthalmology;  Laterality: Left;  [place]  02:06 AP% 26.8 CDE 55.05 fluid pack lot # [id number]    Social History:  reports that she has never smoked. She has never used  smokeless tobacco. She reports current alcohol use. She reports that she does not use drugs.  Family History:  Family History  Problem Relation Age of Onset  . Asthma Mother   . Anxiety disorder Mother   . Lung disease Mother   . COPD Sister      Prior to Admission medications   Medication Sig Start Date  End Date Taking? Authorizing Provider  acetaminophen (TYLENOL) 500 MG tablet Take 2 tablets (1,000 mg total) by mouth 3 (three) times daily. 05/09/20   Glade Lloyd, MD  ALPRAZolam Prudy Feeler) 0.5 MG tablet Take 0.5-1 tablets (0.25-0.5 mg total) by mouth 2 (two) times daily as needed for anxiety. TAKE 1/2 TO 1 TABLET BY MOUTH TWICE DAILY AS NEEDED ANXIETY 05/09/20   Glade Lloyd, MD  augmented betamethasone dipropionate (DIPROLENE-AF) 0.05 % cream APPLY TO AFFECTED AREA(s) TWICE DAILY 01/17/20   Margaretann Loveless, PA-C  bisoprolol-hydrochlorothiazide [facility]) 10-6.25 MG tablet Take 1 tablet by mouth daily. 01/17/20   Margaretann Loveless, PA-C  Boswellia-Glucosamine-Vit D (GLUCOSAMINE COMPLEX PO) Take by mouth daily as needed. 1500    [provider]  cholecalciferol (VITAMIN D) 1000 UNITS tablet Take 1,000 Units by mouth.     [provider]  cyclobenzaprine (FLEXERIL) 5 MG tablet Take 1 tablet (5 mg total) by mouth at bedtime. 05/09/20   Glade Lloyd, MD  DULoxetine (CYMBALTA) 20 MG capsule Take 1 capsule (20 mg total) by mouth daily. 05/09/20   Glade Lloyd, MD  gabapentin (NEURONTIN) 300 MG capsule Take 1 capsule (300 mg total) by mouth 2 (two) times daily. 05/09/20   Glade Lloyd, MD  glucose blood test strip Use to test blood sugar once a day.  DX: E11.9. 11/13/15   Lorie Phenix, MD  metFORMIN (GLUCOPHAGE) 500 MG tablet Take 1 tablet (500 mg total) by mouth 2 (two) times daily. 01/17/20   Margaretann Loveless, PA-C  OMEGA-3 FATTY ACIDS PO Take by mouth.     [provider]  oxyCODONE (OXY IR/ROXICODONE) 5 MG immediate release tablet Take 1 tablet (5 mg total) by mouth every 8 (eight) hours as needed for moderate pain. 05/09/20   Glade Lloyd, MD  senna-docusate (SENOKOT-S) 8.6-50 MG tablet Take 1 tablet by mouth 2 (two) times daily. 05/09/20   Glade Lloyd, MD  triamcinolone cream (KENALOG) 0.1 % APPLY TO AFFECTED AREA(s) TWICE DAILY 04/17/20   Margaretann Loveless,  PA-C  vitamin B-12 (CYANOCOBALAMIN) 1000 MCG tablet Take 1 tablet (1,000 mcg total) by mouth daily. Patient not taking: Reported on 04/21/2020 07/15/15   Lorie Phenix, MD  VITAMIN E-1000 PO Take by mouth.     [provider]  augmented betamethasone dipropionate (DIPROLENE-AF) 0.05 % cream APPLY TO AFFECTED AREA(s) TWICE DAILY 04/18/19   Margaretann Loveless, [place]    Physical Exam: Vitals:   05/12/2020 1806 04/24/2020 1807 04/24/2020 1808 05/18/2020 1810  BP:      Pulse: 64 63 68 65  Resp: Temp:      TempSrc:      SpO2: 100% 100% 100%   Weight:      Height:       General: Not in acute distress HEENT:       Eyes: PERRL, EOMI, no scleral icterus.       ENT: No discharge from the ears and nose       Neck: No JVD, no bruit, no mass felt. Heme: No neck lymph node enlargement. Cardiac: S1/S2, RRR, No murmurs, No  gallops or rubs. Respiratory:  No rales, wheezing, rhonchi or rubs. GI: distended with diffused tenderness, no organomegaly, BS present. GU: No hematuria Ext: trace pitting leg edema bilaterally. 1+DP/PT pulse bilaterally. Musculoskeletal: No joint deformities, No joint redness or warmth, no limitation of ROM in spin. Skin: No rashes.  Neuro: Patient is barely arousable, not following command, not orientated x3, slightly moves extremities on painful stimuli. Psych: Patient is not psychotic  Labs on Admission: I have personally reviewed following labs and imaging studies  CBC: Recent Labs  Lab 05/08/20 0528 05/09/20 0432 June 10, 2020 0940  WBC 10.1 9.7 6.7  NEUTROABS 6.9 7.2 4.9  HGB 9.4* 9.0* 9.4*  HCT 29.6* 28.2* 29.5*  MCV 93.1 93.4 92.8  PLT 227 239 385   Basic Metabolic Panel: Recent Labs  Lab 05/08/20 0528 05/09/20 0432 2020-06-10 0940  NA 137 136 138  K 4.1 4.2 5.4*  CL 102 102 102  CO2 27 28 20*  GLUCOSE 152* 191* 201*  BUN 30* 28* 49*  CREATININE 0.79 0.84 1.52*  CALCIUM 8.9 8.7* 8.7*  MG 2.0 2.1  --    GFR: Estimated Creatinine  Clearance: 20.8 mL/min (A) (by C-G formula based on SCr of 1.52 mg/dL (H)). Liver Function Tests: Recent Labs  Lab 2020-06-10 0940  AST 17  ALT 13  ALKPHOS 62  BILITOT 1.5*  PROT 5.1*  ALBUMIN 1.8*   No results for input(s): LIPASE, AMYLASE in the last 168 hours. Recent Labs  Lab 06/10/2020 1101  AMMONIA <9*   Coagulation Profile: Recent Labs  Lab 2020-06-10 0940  INR 1.1   Cardiac Enzymes: No results for input(s): CKTOTAL, CKMB, CKMBINDEX, TROPONINI in the last 168 hours. BNP (last 3 results) No results for input(s): PROBNP in the last 8760 hours. HbA1C: No results for input(s): HGBA1C in the last 72 hours. CBG: Recent Labs  Lab 05/08/20 2113 05/09/20 0836 05/09/20 1228 05/09/20 1700 06/10/2020 0940  GLUCAP 224* 155* 127* 110* 194*   Lipid Profile: No results for input(s): CHOL, HDL, LDLCALC, TRIG, CHOLHDL, LDLDIRECT in the last 72 hours. Thyroid Function Tests: No results for input(s): TSH, T4TOTAL, FREET4, T3FREE, THYROIDAB in the last 72 hours. Anemia Panel: No results for input(s): VITAMINB12, FOLATE, FERRITIN, TIBC, IRON, RETICCTPCT in the last 72 hours. Urine analysis:    Component Value Date/Time   COLORURINE AMBER (A) 2020-06-10 0940   APPEARANCEUR CLOUDY (A) 10-Jun-2020 0940   LABSPEC 1.026 06/10/2020 0940   PHURINE 5.0 2020-06-10 0940   GLUCOSEU NEGATIVE 2020/06/10 0940   HGBUR NEGATIVE 06-10-2020 0940   BILIRUBINUR NEGATIVE 06-10-2020 0940   KETONESUR 5 (A) 06/10/20 0940   PROTEINUR NEGATIVE 10-Jun-2020 0940   NITRITE NEGATIVE June 10, 2020 0940   LEUKOCYTESUR NEGATIVE 06/10/20 0940   Sepsis Labs: (procalcitonin:4,lacticidven:4) ) Recent Results (from the past 240 hour(s))  SARS Coronavirus 2 by RT PCR (hospital order, performed in [facility] hospital lab) Nasopharyngeal Nasopharyngeal Swab     Status: None   Collection Time: 05/08/20  4:11 PM   Specimen: Nasopharyngeal Swab  Result Value Ref Range Status   SARS Coronavirus 2 NEGATIVE  NEGATIVE Final    Comment: (NOTE) SARS-CoV-2 target nucleic acids are NOT DETECTED.  The SARS-CoV-2 RNA is generally detectable in upper and lower respiratory specimens during the acute phase of infection. The lowest concentration of SARS-CoV-2 viral copies this assay can detect is 250 copies / mL. A negative result does not preclude SARS-CoV-2 infection and should not be used as the sole basis for treatment or other patient  management decisions.  A negative result may occur with improper specimen collection / handling, submission of specimen other than nasopharyngeal swab, presence of viral mutation(s) within the areas targeted by this assay, and inadequate number of viral copies (<250 copies / mL). A negative result must be combined with clinical observations, patient history, and epidemiological information.  Fact Sheet for Patients:   BoilerBrush.com.cy  Fact Sheet for Healthcare Providers: https://pope.com/  This test is not yet approved or  cleared by the [place] FDA and has been authorized for detection and/or diagnosis of SARS-CoV-2 by FDA under an Emergency Use Authorization (EUA).  This EUA will remain in effect (meaning this test can be used) for the duration of the COVID-19 declaration under Section 564(b)(1) of the Act, 21 U.S.C. section 360bbb-3(b)(1), unless the authorization is terminated or revoked sooner.  Performed at [facility], [address] [place], [place] [address]   SARS Coronavirus 2 by RT PCR (hospital order, performed in [facility] hospital lab) Nasopharyngeal Nasopharyngeal Swab     Status: None   Collection Time: 05/17/2020 11:01 AM   Specimen: Nasopharyngeal Swab  Result Value Ref Range Status   SARS Coronavirus 2 NEGATIVE NEGATIVE Final    Comment: (NOTE) SARS-CoV-2 target nucleic acids are NOT DETECTED.  The SARS-CoV-2 RNA is generally detectable in upper and lower respiratory  specimens during the acute phase of infection. The lowest concentration of SARS-CoV-2 viral copies this assay can detect is 250 copies / mL. A negative result does not preclude SARS-CoV-2 infection and should not be used as the sole basis for treatment or other patient management decisions.  A negative result may occur with improper specimen collection / handling, submission of specimen other than nasopharyngeal swab, presence of viral mutation(s) within the areas targeted by this assay, and inadequate number of viral copies (<250 copies / mL). A negative result must be combined with clinical observations, patient history, and epidemiological information.  Fact Sheet for Patients:   BoilerBrush.com.cy  Fact Sheet for Healthcare Providers: https://pope.com/  This test is not yet approved or  cleared by the [place] FDA and has been authorized for detection and/or diagnosis of SARS-CoV-2 by FDA under an Emergency Use Authorization (EUA).  This EUA will remain in effect (meaning this test can be used) for the duration of the COVID-19 declaration under Section 564(b)(1) of the Act, 21 U.S.C. section 360bbb-3(b)(1), unless the authorization is terminated or revoked sooner.  Performed at [facility], [address] [place], [place] [address]      Radiological Exams on Admission: CT Head Wo Contrast  Result Date: 05/05/2020 CLINICAL DATA:  Altered mental status of unclear cause EXAM: CT HEAD WITHOUT CONTRAST TECHNIQUE: Contiguous axial images were obtained from the base of the skull through the vertex without intravenous contrast. COMPARISON:  04/22/2020 FINDINGS: Brain: Generalized atrophy. Normal ventricular morphology. No midline shift or mass effect. Extensive small vessel chronic ischemic changes of deep cerebral white matter. Old infarcts at the deep RIGHT frontal white matter, basal ganglia and BILATERAL cerebellar  hemispheres. Few scattered streak artifacts. Resolution of trace RIGHT frontal subdural hematoma seen on previous exam. 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 No intracranial hemorrhage, mass lesion or evidence of acute infarction. Vascular: Atherosclerotic calcification of internal carotid and vertebral arteries at skull base Skull: Intact Sinuses/Orbits: Partial opacification of LEFT mastoid air cells, new. Remaining visualized paranasal sinuses and RIGHT mastoid air cells clear. Other: N/A IMPRESSION: Atrophy with small vessel chronic ischemic changes of deep  cerebral white matter. Resolution of trace RIGHT frontal subdural hematoma seen on previous exam with residual small BILATERAL chronic subdural hygromas in the frontal regions bilaterally. 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 No new intracranial abnormalities. Electronically Signed   By: Ulyses Southward M.D.   On: 05/11/2020 11:23   CT Angio Chest PE W and/or Wo Contrast  Result Date: 05/07/2020 CLINICAL DATA:  Significant shortness of breath, history of known deep venous thrombosis and recent filter placement EXAM: CT ANGIOGRAPHY CHEST CT ABDOMEN AND PELVIS WITH CONTRAST TECHNIQUE: Multidetector CT imaging of the chest was performed using the standard protocol during bolus administration of intravenous contrast. Multiplanar CT image reconstructions and MIPs were obtained to evaluate the vascular anatomy. Multidetector CT imaging of the abdomen and pelvis was performed using the standard protocol during bolus administration of intravenous contrast. CONTRAST:  64mL OMNIPAQUE IOHEXOL 350 MG/ML SOLN COMPARISON:  05/04/2019 FINDINGS: CTA CHEST FINDINGS Cardiovascular: Thoracic aorta demonstrates mild atherosclerotic calcification without aneurysmal dilatation or dissection. Cardiac enlargement is noted. The pulmonary artery shows a normal branching pattern bilaterally. No  sizable filling defects to suggest pulmonary emboli are seen. Coronary calcifications are noted. No sizable pericardial effusion is seen. Mediastinum/Nodes: Thoracic inlet is within normal limits. No sizable hilar or mediastinal adenopathy is noted. The esophagus is within normal limits. Lungs/Pleura: Small bilateral pleural effusions are noted right greater than left with mild associated compressive atelectasis again right greater than left. The lungs are otherwise well aerated without focal infiltrate. No sizable parenchymal nodules are seen. No pneumothoraces are noted. Musculoskeletal: Degenerative changes of the thoracic spine are noted. No acute bony abnormality is seen. Review of the MIP images confirms the above findings. CT ABDOMEN and PELVIS FINDINGS Hepatobiliary: Liver is well visualized without focal mass lesion. The gallbladder is well distended although no gallstones are noted. Pancreas: Unremarkable. No pancreatic ductal dilatation or surrounding inflammatory changes. Spleen: Normal in size without focal abnormality. Adrenals/Urinary Tract: Adrenal glands are within normal limits. Kidneys demonstrate a normal enhancement pattern. No renal calculi or urinary tract obstructive changes are seen. Small left renal cyst is noted. The bladder is well distended. Small focus of air is noted within the bladder consistent with recent instrumentation. Stomach/Bowel: Appendix is well visualized and within normal limits. Fecal material is noted throughout the colon. The sigmoid colon is distended with changes of diverticular disease as well as focal wall thickening consistent with focal diverticulitis. Large stool burden in the sigmoid is noted which may be related to a degree of impaction. Multiple extraluminal foci of air are noted consistent with mild perforation likely related to this focal inflammatory change. Small bowel appears within normal limits with the exception of some compensatory inflammatory change  related to the sigmoid changes. Stomach is unremarkable. Vascular/Lymphatic: Atherosclerotic calcifications are noted. IVC filter is noted in place. Right-sided DVT is again noted and stable. No definitive thrombus within the IVC filter is seen. No significant lymphadenopathy is noted. Reproductive: Uterus and bilateral adnexa are unremarkable. Other: Minimal free fluid is noted related to the perforation. Minimal changes of anasarca are noted. Musculoskeletal: Degenerative changes of lumbar spine are again seen. Stable hematoma over the right hip is noted. Review of the MIP images confirms the above findings. IMPRESSION: CTA of the chest: No evidence of pulmonary embolism. Mild bibasilar atelectasis and small effusions right greater than left. CT of the abdomen and pelvis: Changes of diverticulitis in the sigmoid colon with a large focal stool burden  which may represent some impaction. Associated inflammatory changes are noted as well as findings of microperforations. Small amount of free fluid is noted as well. Distended gallbladder of uncertain significance. This is stable from the prior exam. Stable hematoma over the right hip. Electronically Signed   By: Alcide Clever M.D.   On: 2020-06-11 11:42   CT ABDOMEN PELVIS W CONTRAST  Result Date: 2020/06/11 CLINICAL DATA:  Significant shortness of breath, history of known deep venous thrombosis and recent filter placement EXAM: CT ANGIOGRAPHY CHEST CT ABDOMEN AND PELVIS WITH CONTRAST TECHNIQUE: Multidetector CT imaging of the chest was performed using the standard protocol during bolus administration of intravenous contrast. Multiplanar CT image reconstructions and MIPs were obtained to evaluate the vascular anatomy. Multidetector CT imaging of the abdomen and pelvis was performed using the standard protocol during bolus administration of intravenous contrast. CONTRAST:  83mL OMNIPAQUE IOHEXOL 350 MG/ML SOLN COMPARISON:  05/04/2019 FINDINGS: CTA CHEST FINDINGS  Cardiovascular: Thoracic aorta demonstrates mild atherosclerotic calcification without aneurysmal dilatation or dissection. Cardiac enlargement is noted. The pulmonary artery shows a normal branching pattern bilaterally. No sizable filling defects to suggest pulmonary emboli are seen. Coronary calcifications are noted. No sizable pericardial effusion is seen. Mediastinum/Nodes: Thoracic inlet is within normal limits. No sizable hilar or mediastinal adenopathy is noted. The esophagus is within normal limits. Lungs/Pleura: Small bilateral pleural effusions are noted right greater than left with mild associated compressive atelectasis again right greater than left. The lungs are otherwise well aerated without focal infiltrate. No sizable parenchymal nodules are seen. No pneumothoraces are noted. Musculoskeletal: Degenerative changes of the thoracic spine are noted. No acute bony abnormality is seen. Review of the MIP images confirms the above findings. CT ABDOMEN and PELVIS FINDINGS Hepatobiliary: Liver is well visualized without focal mass lesion. The gallbladder is well distended although no gallstones are noted. Pancreas: Unremarkable. No pancreatic ductal dilatation or surrounding inflammatory changes. Spleen: Normal in size without focal abnormality. Adrenals/Urinary Tract: Adrenal glands are within normal limits. Kidneys demonstrate a normal enhancement pattern. No renal calculi or urinary tract obstructive changes are seen. Small left renal cyst is noted. The bladder is well distended. Small focus of air is noted within the bladder consistent with recent instrumentation. Stomach/Bowel: Appendix is well visualized and within normal limits. Fecal material is noted throughout the colon. The sigmoid colon is distended with changes of diverticular disease as well as focal wall thickening consistent with focal diverticulitis. Large stool burden in the sigmoid is noted which may be related to a degree of impaction.  Multiple extraluminal foci of air are noted consistent with mild perforation likely related to this focal inflammatory change. Small bowel appears within normal limits with the exception of some compensatory inflammatory change related to the sigmoid changes. Stomach is unremarkable. Vascular/Lymphatic: Atherosclerotic calcifications are noted. IVC filter is noted in place. Right-sided DVT is again noted and stable. No definitive thrombus within the IVC filter is seen. No significant lymphadenopathy is noted. Reproductive: Uterus and bilateral adnexa are unremarkable. Other: Minimal free fluid is noted related to the perforation. Minimal changes of anasarca are noted. Musculoskeletal: Degenerative changes of lumbar spine are again seen. Stable hematoma over the right hip is noted. Review of the MIP images confirms the above findings. IMPRESSION: CTA of the chest: No evidence of pulmonary embolism. Mild bibasilar atelectasis and small effusions right greater than left. CT of the abdomen and pelvis: Changes of diverticulitis in the sigmoid colon with a large focal stool burden which may represent some  impaction. Associated inflammatory changes are noted as well as findings of microperforations. Small amount of free fluid is noted as well. Distended gallbladder of uncertain significance. This is stable from the prior exam. Stable hematoma over the right hip. Electronically Signed   By: Alcide CleverMark  Lukens M.D.   On: 05/04/2020 11:42   DG Chest Portable 1 View  Result Date: 04/26/2020 CLINICAL DATA:  Severe shortness of breath EXAM: PORTABLE CHEST 1 VIEW COMPARISON:  Portable exam 0956 hours compared to 04/20/2020 FINDINGS: Enlargement of cardiac silhouette. Mediastinal contours and pulmonary vascularity normal. Atherosclerotic calcification aorta. Minimal bibasilar opacities question atelectasis versus early infiltrate. Remaining lungs clear. No pleural effusion or pneumothorax. Bones demineralized. IMPRESSION:  Enlargement of cardiac silhouette. Minimal bibasilar opacities question atelectasis versus infiltrate. Electronically Signed   By: Ulyses SouthwardMark  Boles M.D.   On: 05/16/2020 10:33     EKG: Independently reviewed.  Sinus rhythm, QTC 416, low voltage.  Assessment/Plan Principal Problem:   Acute diverticulitis Active Problems:   Anxiety, generalized   Essential hypertension   Acute metabolic encephalopathy   Severe sepsis with septic shock (HCC)   Acute renal failure superimposed on stage 3 chronic kidney disease (HCC)   Hyperkalemia   Type II diabetes mellitus with renal manifestations (HCC)   Depression with anxiety   SDH (subdural hematoma) (HCC)   Diverticulitis   Elevated troponin   Severe sepsis with septic shock due to acute diverticulitis with microperforation: Initially patient had blood pressure 108/63.  Later on blood pressure dropped with SBP 70s.  Patient has fever, tachypnea.  No leukocytosis.  Lactic acid 2.8 -->3.2.   -will admitted to stepdown as inpatient -Consulted Dr. Karna ChristmasAleskerov of ICU -IV Zosyn (patient received 1 dose of vancomycin, Flagyl and cefepime) -General surgeon, Dr. Everlene FarrierPabon is consulted - -will get Procalcitonin and trend lactic acid levels per sepsis protocol. -IVF: total of 1500 CC of NS bolus in ED, followed by 75 cc/h (patient has had elevated BNP 711, cannot give aggressive IV fluid resuscitation) -start Levophed   Acute metabolic encephalopathy: Possibly due to septic shock.CT of head showed resolution of trace RIGHT frontal subdural hematoma seen on previous exam with residual small BILATERAL chronic subdural hygromas in the frontal regions bilaterally. -Frequent neuro check -Keep patient n.p.o. -Hold all oral medications until mental status improves  Anxiety, generalized -Hold home medications  Essential hypertension -Hold blood pressure medications  Acute renal failure superimposed on stage 3 chronic kidney disease (HCC): Baseline creatinine ~1.0.  her creatinine is 1.52, BUN 49, likely prerenal and possible ATN given hypotension -Avoid using renal toxic medications -IV fluid as above -Follow-up by BMP  Hyperkalemia: K 5.4. Can not give potassium binders such as Kayexalate due to diverticulitis with perforation -20 mg of IV Lasix was given -Follow-up of BMP  Type II diabetes mellitus with renal manifestations (HCC): Most recent A1c 7.0, poorly controled. Patient is taking Metformin at home -SSI  Depression with anxiety -Hold home medications  SDH (subdural hematoma) (HCC): CT scan showed resolution of trace RIGHT frontal subdural hematoma  -Neuro check frequently  Elevated troponin: Troponin 66, possibly due to demand ischemia secondary to septic shock -Trend troponin -Will not give aspirin per rectum due to diverticulitis     DVT ppx: SCD only to left leg Code Status: Full code per her niece Family Communication: Yes, patient's Niece  at bed side Disposition Plan:  Anticipate discharge back to previous SNF environment Consults called:  Dr. Everlene FarrierPabon of surgery and Dr. Karna ChristmasAleskerov fo ICU Admission status: SDU/inpation  Status is: Inpatient  Remains inpatient appropriate because:Inpatient level of care appropriate due to severity of illness   Dispo: The patient is from: SNF              Anticipated d/c is to: SNF              Anticipated d/c date is: 2 days              Patient currently is not medically stable to d/c.          Date of Service 05/18/2020    Ivor Costa [facility]   If 7PM-7AM, please contact night-coverage [email or web address] 05/06/2020, 6:26 PM

## 2020-05-13 NOTE — Consult Note (Addendum)
Pharmacy Antibiotic Note  Sierra Lloyd is a 86 y.o. female admitted on 06/04/2020 with diverticulitis with microperforation.  Pharmacy has been consulted for pip/tazo dosing.  Plan: Pt received cefepime 2 g x 1 in the ED plus flagyl x 1 Zosyn 3.375g IV q12h (4 hour infusion) ordered for 2200.   Height: 5\' 1"  (154.9 cm) Weight: 50.6 kg (111 lb 8.8 oz) IBW/kg (Calculated) : 47.8  Temp (24hrs), Avg:100.8 F (38.2 C), Min:100.8 F (38.2 C), Max:100.8 F (38.2 C)  Recent Labs  Lab 05/08/20 0528 05/09/20 0432 2020/06/04 0940  WBC 10.1 9.7 6.7  CREATININE 0.79 0.84 1.52*  LATICACIDVEN  --   --  2.8*    Estimated Creatinine Clearance: 20.8 mL/min (A) (by C-G formula based on SCr of 1.52 mg/dL (H)).    Allergies  Allergen Reactions  . Tetracycline     Roof of mouth broke out.    Antimicrobials this admission: 6/22 cefepime, vancomycin, and flagyl x 1 6/22 pip/tazo >>   Dose adjustments this admission: None  Microbiology results: 6/22 BCx: pending 6/22 UCx: pending   Thank you for allowing pharmacy to be a part of this patient's care.  7/22, PharmD, BCPS 06-04-2020 12:27 PM

## 2020-05-13 NOTE — ED Provider Notes (Signed)
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 05/12/2020 1104)  iohexol (OMNIPAQUE) 350 MG/ML injection 60 mL (60 mLs Intravenous Contrast Given 05/17/2020 1038)     ED Discharge Orders    None       Note:  This document was prepared using Dragon voice recognition software and may include unintentional dictation errors.   Concha Se, MD 05/17/2020 1250

## 2020-05-13 NOTE — Consult Note (Signed)
[facility] SURGICAL CONSULTATION NOTE (initial) - cpt: [phone]   HISTORY OF PRESENT ILLNESS (HPI):  Patient with a history of dementia and non-verbal and non-contributory to history. No family at bedside. History obtained through chart review and discussion with medical team. 86 y.o. female presented to [facility] ED today for evaluation of altered status. Reportedly at SNF was less responsive than typical and noticed an increase in peripheral edema. Further history limited. EDP unable to contact family and uncertain of code status and goals of care. Work up in the Ed was concerning for fever on arrival to 100.9, tachypnea, lactic acidosis to 2.8, no leukocytosis, AKI with sCr - 1.52, hyperkalemia to 5.4, initial procalcitonin of 14, and CTs were concerning for acute sigmoid diverticulitis without perforation or abscess.   Surgery is consulted by emergency medicine physician Dr. Artis Delay, MD in this context for evaluation and management of diverticulitis.   PAST MEDICAL HISTORY (PMH):  Past Medical History:  Diagnosis Date  . Allergy   . Anxiety   . Arthritis   . Diabetes mellitus without complication (HCC)   . Hypertension      PAST SURGICAL HISTORY (PSH):  Past Surgical History:  Procedure Laterality Date  . CATARACT EXTRACTION W/PHACO Left 04/12/2016   Procedure: CATARACT EXTRACTION PHACO AND INTRAOCULAR LENS PLACEMENT (IOC);  Surgeon: Sallee Lange, MD;  Location: [facility] ORS;  Service: Ophthalmology;  Laterality: Left;  [place]  02:06 AP% 26.8 CDE 55.05 fluid pack lot # [id number]     MEDICATIONS:  Prior to Admission medications   Medication Sig Start Date End Date Taking? Authorizing Provider  acetaminophen (TYLENOL) 500 MG tablet Take 2 tablets (1,000 mg total) by mouth 3 (three) times daily. 05/09/20   Glade Lloyd, MD  ALPRAZolam Prudy Feeler) 0.5 MG tablet Take 0.5-1 tablets (0.25-0.5 mg total) by mouth 2 (two) times daily as needed for anxiety. TAKE 1/2 TO 1 TABLET BY  MOUTH TWICE DAILY AS NEEDED ANXIETY 05/09/20   Glade Lloyd, MD  augmented betamethasone dipropionate (DIPROLENE-AF) 0.05 % cream APPLY TO AFFECTED AREA(s) TWICE DAILY 01/17/20   Margaretann Loveless, PA-C  bisoprolol-hydrochlorothiazide [facility]) 10-6.25 MG tablet Take 1 tablet by mouth daily. 01/17/20   Margaretann Loveless, PA-C  Boswellia-Glucosamine-Vit D (GLUCOSAMINE COMPLEX PO) Take by mouth daily as needed. 1500    [provider]  cholecalciferol (VITAMIN D) 1000 UNITS tablet Take 1,000 Units by mouth.     [provider]  cyclobenzaprine (FLEXERIL) 5 MG tablet Take 1 tablet (5 mg total) by mouth at bedtime. 05/09/20   Glade Lloyd, MD  DULoxetine (CYMBALTA) 20 MG capsule Take 1 capsule (20 mg total) by mouth daily. 05/09/20   Glade Lloyd, MD  gabapentin (NEURONTIN) 300 MG capsule Take 1 capsule (300 mg total) by mouth 2 (two) times daily. 05/09/20   Glade Lloyd, MD  glucose blood test strip Use to test blood sugar once a day.  DX: E11.9. 11/13/15   Lorie Phenix, MD  metFORMIN (GLUCOPHAGE) 500 MG tablet Take 1 tablet (500 mg total) by mouth 2 (two) times daily. 01/17/20   Margaretann Loveless, PA-C  OMEGA-3 FATTY ACIDS PO Take by mouth.     [provider]  oxyCODONE (OXY IR/ROXICODONE) 5 MG immediate release tablet Take 1 tablet (5 mg total) by mouth every 8 (eight) hours as needed for moderate pain. 05/09/20   Glade Lloyd, MD  senna-docusate (SENOKOT-S) 8.6-50 MG tablet Take 1 tablet by mouth 2 (two) times daily. 05/09/20   Glade Lloyd, MD  triamcinolone cream (KENALOG) 0.1 % APPLY TO AFFECTED AREA(s) TWICE DAILY 04/17/20   Mar Daring, PA-C  vitamin B-12 (CYANOCOBALAMIN) 1000 MCG tablet Take 1 tablet (1,000 mcg total) by mouth daily. Patient not taking: Reported on 04/21/2020 07/15/15   Margarita Rana, MD  VITAMIN E-1000 PO Take by mouth.     [provider]  augmented betamethasone dipropionate (DIPROLENE-AF) 0.05 % cream APPLY TO AFFECTED  AREA(s) TWICE DAILY 04/18/19   Mar Daring, PA-C     ALLERGIES:  Allergies  Allergen Reactions  . Tetracycline     Roof of mouth broke out.     SOCIAL HISTORY:  Social History   Socioeconomic History  . Marital status: Single    Spouse name: Not on file  . Number of children: Not on file  . Years of education: Not on file  . Highest education level: Not on file  Occupational History  . Not on file  Tobacco Use  . Smoking status: Never Smoker  . Smokeless tobacco: Never Used  Vaping Use  . Vaping Use: Never used  Substance and Sexual Activity  . Alcohol use: Yes    Comment: occasional  . Drug use: No  . Sexual activity: Not on file  Other Topics Concern  . Not on file  Social History Narrative  . Not on file   Social Determinants of Health   Financial Resource Strain:   . Difficulty of Paying Living Expenses:   Food Insecurity:   . Worried About Charity fundraiser in the Last Year:   . Arboriculturist in the Last Year:   Transportation Needs:   . Film/video editor (Medical):   Marland Kitchen Lack of Transportation (Non-Medical):   Physical Activity:   . Days of Exercise per Week:   . Minutes of Exercise per Session:   Stress:   . Feeling of Stress :   Social Connections:   . Frequency of Communication with Friends and Family:   . Frequency of Social Gatherings with Friends and Family:   . Attends Religious Services:   . Active Member of Clubs or Organizations:   . Attends Archivist Meetings:   Marland Kitchen Marital Status:   Intimate Partner Violence:   . Fear of Current or Ex-Partner:   . Emotionally Abused:   Marland Kitchen Physically Abused:   . Sexually Abused:      FAMILY HISTORY:  Family History  Problem Relation Age of Onset  . Asthma Mother   . Anxiety disorder Mother   . Lung disease Mother   . COPD Sister       REVIEW OF SYSTEMS:  Review of Systems  Unable to perform ROS: Mental status change    VITAL SIGNS:  Temp:  [100.8 F (38.2 C)]  100.8 F (38.2 C) (06/22 0945) Resp:  [30] 30 (06/22 0945) BP: (108)/(63) 108/63 (06/22 0945) SpO2:  [91 %] 91 % (06/22 0945) Weight:  [50.6 kg] 50.6 kg (06/22 0947)     Height: 5\' 1"  (154.9 cm) Weight: 50.6 kg BMI (Calculated): 3137.21   INTAKE/OUTPUT:  No intake/output data recorded.  PHYSICAL EXAM:  Physical Exam Constitutional:      General: She is not in acute distress.    Appearance: She is normal weight. She is not ill-appearing.     Comments: Patient is somnolent, does not open eyes to verbal stimuli, groans to painful stimuli, does not provide any to history of examiantion  HENT:     Head: Normocephalic  and atraumatic.  Eyes:     Comments: Unable to assess  Cardiovascular:     Rate and Rhythm: Normal rate.     Pulses: Normal pulses.  Pulmonary:     Effort: Pulmonary effort is normal. No respiratory distress.  Abdominal:     General: There is distension.     Palpations: Abdomen is soft.     Tenderness: There is abdominal tenderness. There is no guarding or rebound.     Comments: Abdomen does appear firm and somewhat distended, it is difficult to ascertain pinpoint tenderness given dementia but she does groan with palpation throughout her abdomen, unable to assess for rebound  Genitourinary:    Comments: deferred Musculoskeletal:     Right lower leg: Edema present.     Left lower leg: Edema present.  Skin:    General: Skin is warm and dry.      Labs:  CBC Latest Ref Rng & Units 2020/06/07 05/09/2020 05/08/2020  WBC 4.0 - 10.5 K/uL 6.7 9.7 10.1  Hemoglobin 12.0 - 15.0 g/dL 1.6(X) 0.9(U) 0.4(V)  Hematocrit 36 - 46 % 29.5(L) 28.2(L) 29.6(L)  Platelets 150 - 400 K/uL 385 239 227   CMP Latest Ref Rng & Units 06/07/2020 05/09/2020 05/08/2020  Glucose 70 - 99 mg/dL 409(W) 119(J) 478(G)  BUN 8 - 23 mg/dL 95(A) 21(H) 08(M)  Creatinine 0.44 - 1.00 mg/dL 5.78(I) 6.96 2.95  Sodium 135 - 145 mmol/L 138 136 137  Potassium 3.5 - 5.1 mmol/L 5.4(H) 4.2 4.1  Chloride 98 - 111  mmol/L 102 102 102  CO2 22 - 32 mmol/L 20(L) 28 27  Calcium 8.9 - 10.3 mg/dL 2.8(U) 1.3(K) 8.9  Total Protein 6.5 - 8.1 g/dL 5.1(L) - -  Total Bilirubin 0.3 - 1.2 mg/dL 4.4(W) - -  Alkaline Phos 38 - 126 U/L 62 - -  AST 15 - 41 U/L 17 - -  ALT 0 - 44 U/L 13 - -     Imaging studies:   CT Abdomen/Pelvis (06/07/20) personally reviewed with inflammatory changes to the sigmoid colon without obvious free air or abscess, and radiologist report reviewed:  IMPRESSION: CTA of the chest: No evidence of pulmonary embolism.  Mild bibasilar atelectasis and small effusions right greater than left.  CT of the abdomen and pelvis: Changes of diverticulitis in the sigmoid colon with a large focal stool burden which may represent some impaction. Associated inflammatory changes are noted as well as findings of microperforations. Small amount of free fluid is noted as well.  Distended gallbladder of uncertain significance. This is stable from the prior exam.  Stable hematoma over the right hip.   Assessment/Plan: (ICD-10's: K21.92) 86 y.o. female with AMS (unclear baseline), lactic acidosis, and likely sepsis secondary to diverticulitis as most likely culprit, complicated by multiple pertinent comorbidities including dementia and debilitation.   - Appreciate medicine admission   - She is a very poor surgical candidate given her current condition. I do think it is pertinent to try and contact family +/- palliative care to determine goals of care. I do not think she would do well with surgery.   - Remain NPO + IVF resuscitation  - IV Abx   - pain control prn  - monitor abdominal examination closely +/- re-imaging as needed  - Monitor lactic acidosis, renal function, electrolytes, leukocytosis   - further management per primary service   All of the above findings and recommendations were discussed with the medical team  Thank you for the opportunity to participate  in this patient's care.    -- Lynden Oxford, PA-C [facility] 05/29/2020, 12:14 PM [phone] M-F: 7am - 4pm

## 2020-05-14 ENCOUNTER — Inpatient Hospital Stay: Payer: Medicare Other

## 2020-05-14 ENCOUNTER — Inpatient Hospital Stay
Admit: 2020-05-14 | Discharge: 2020-05-14 | Disposition: A | Discharge status: Home / Self Care | Payer: Medicare Other | Attending: Pulmonary Disease | Admitting: Pulmonary Disease

## 2020-05-14 LAB — BLOOD CULTURE ID PANEL (REFLEXED)
Acinetobacter baumannii: NOT DETECTED
Acinetobacter baumannii: NOT DETECTED
Candida albicans: NOT DETECTED
Candida albicans: NOT DETECTED
Candida glabrata: NOT DETECTED
Candida glabrata: NOT DETECTED
Candida krusei: NOT DETECTED
Candida krusei: NOT DETECTED
Candida parapsilosis: NOT DETECTED
Candida parapsilosis: NOT DETECTED
Candida tropicalis: NOT DETECTED
Candida tropicalis: NOT DETECTED
Enterobacter cloacae complex: NOT DETECTED
Enterobacter cloacae complex: NOT DETECTED
Enterobacteriaceae species: NOT DETECTED
Enterobacteriaceae species: NOT DETECTED
Enterococcus species: NOT DETECTED
Enterococcus species: NOT DETECTED
Escherichia coli: NOT DETECTED
Escherichia coli: NOT DETECTED
Haemophilus influenzae: NOT DETECTED
Haemophilus influenzae: NOT DETECTED
Klebsiella oxytoca: NOT DETECTED
Klebsiella oxytoca: NOT DETECTED
Klebsiella pneumoniae: NOT DETECTED
Klebsiella pneumoniae: NOT DETECTED
Listeria monocytogenes: NOT DETECTED
Listeria monocytogenes: NOT DETECTED
Methicillin resistance: NOT DETECTED
Neisseria meningitidis: NOT DETECTED
Neisseria meningitidis: NOT DETECTED
Proteus species: NOT DETECTED
Proteus species: NOT DETECTED
Pseudomonas aeruginosa: NOT DETECTED
Pseudomonas aeruginosa: NOT DETECTED
Serratia marcescens: NOT DETECTED
Serratia marcescens: NOT DETECTED
Staphylococcus aureus (BCID): NOT DETECTED
Staphylococcus aureus (BCID): NOT DETECTED
Staphylococcus species: DETECTED — AB
Staphylococcus species: NOT DETECTED
Streptococcus agalactiae: NOT DETECTED
Streptococcus agalactiae: NOT DETECTED
Streptococcus pneumoniae: NOT DETECTED
Streptococcus pneumoniae: NOT DETECTED
Streptococcus pyogenes: NOT DETECTED
Streptococcus pyogenes: NOT DETECTED
Streptococcus species: NOT DETECTED
Streptococcus species: NOT DETECTED

## 2020-05-14 LAB — CBC WITH DIFFERENTIAL/PLATELET
Abs Immature Granulocytes: 0.14 10*3/uL — ABNORMAL HIGH (ref 0.00–0.07)
Basophils Absolute: 0 10*3/uL (ref 0.0–0.1)
Basophils Relative: 0 %
Eosinophils Absolute: 0 10*3/uL (ref 0.0–0.5)
Eosinophils Relative: 0 %
HCT: 28 % — ABNORMAL LOW (ref 36.0–46.0)
Hemoglobin: 8.7 g/dL — ABNORMAL LOW (ref 12.0–15.0)
Immature Granulocytes: 1 %
Lymphocytes Relative: 7 %
Lymphs Abs: 0.9 10*3/uL (ref 0.7–4.0)
MCH: 29 pg (ref 26.0–34.0)
MCHC: 31.1 g/dL (ref 30.0–36.0)
MCV: 93.3 fL (ref 80.0–100.0)
Monocytes Absolute: 0.2 10*3/uL (ref 0.1–1.0)
Monocytes Relative: 1 %
Neutro Abs: 11.6 10*3/uL — ABNORMAL HIGH (ref 1.7–7.7)
Neutrophils Relative %: 91 %
Platelets: 368 10*3/uL (ref 150–400)
RBC: 3 MIL/uL — ABNORMAL LOW (ref 3.87–5.11)
RDW: 15.3 % (ref 11.5–15.5)
Smear Review: NORMAL
WBC: 12.8 10*3/uL — ABNORMAL HIGH (ref 4.0–10.5)
nRBC: 0 % (ref 0.0–0.2)

## 2020-05-14 LAB — COMPREHENSIVE METABOLIC PANEL
ALT: 13 U/L (ref 0–44)
AST: 26 U/L (ref 15–41)
Albumin: 1.9 g/dL — ABNORMAL LOW (ref 3.5–5.0)
Alkaline Phosphatase: 46 U/L (ref 38–126)
Anion gap: 12 (ref 5–15)
BUN: 54 mg/dL — ABNORMAL HIGH (ref 8–23)
CO2: 21 mmol/L — ABNORMAL LOW (ref 22–32)
Calcium: 8 mg/dL — ABNORMAL LOW (ref 8.9–10.3)
Chloride: 106 mmol/L (ref 98–111)
Creatinine, Ser: 1.6 mg/dL — ABNORMAL HIGH (ref 0.44–1.00)
GFR calc Af Amer: 34 mL/min — ABNORMAL LOW (ref >60)
GFR calc non Af Amer: 29 mL/min — ABNORMAL LOW (ref >60)
Glucose, Bld: 212 mg/dL — ABNORMAL HIGH (ref 70–99)
Potassium: 5.2 mmol/L — ABNORMAL HIGH (ref 3.5–5.1)
Sodium: 139 mmol/L (ref 135–145)
Total Bilirubin: 0.9 mg/dL (ref 0.3–1.2)
Total Protein: 4.9 g/dL — ABNORMAL LOW (ref 6.5–8.1)

## 2020-05-14 LAB — ECHOCARDIOGRAM COMPLETE
Height: 61 in
Weight: 2261.04 oz

## 2020-05-14 LAB — GLUCOSE, CAPILLARY
Glucose-Capillary: 215 mg/dL — ABNORMAL HIGH (ref 70–99)
Glucose-Capillary: 197 mg/dL — ABNORMAL HIGH (ref 70–99)
Glucose-Capillary: 154 mg/dL — ABNORMAL HIGH (ref 70–99)
Glucose-Capillary: 123 mg/dL — ABNORMAL HIGH (ref 70–99)
Glucose-Capillary: 126 mg/dL — ABNORMAL HIGH (ref 70–99)

## 2020-05-14 LAB — TROPONIN I (HIGH SENSITIVITY): Troponin I (High Sensitivity): 405 ng/L (ref <18)

## 2020-05-14 LAB — MRSA PCR SCREENING: MRSA by PCR: NEGATIVE

## 2020-05-14 LAB — LACTIC ACID, PLASMA
Lactic Acid, Venous: 3.1 mmol/L (ref 0.5–1.9)
Lactic Acid, Venous: 3.3 mmol/L (ref 0.5–1.9)
Lactic Acid, Venous: 2.8 mmol/L (ref 0.5–1.9)

## 2020-05-14 LAB — MAGNESIUM: Magnesium: 2.1 mg/dL (ref 1.7–2.4)

## 2020-05-14 LAB — PHOSPHORUS: Phosphorus: 4.2 mg/dL (ref 2.5–4.6)

## 2020-05-14 MED ORDER — LACTATED RINGERS IV SOLN: Freq: Once | INTRAVENOUS | Status: AC

## 2020-05-14 MED ORDER — CHLORHEXIDINE GLUCONATE 0.12 % MT SOLN
15.0000 mL | Freq: Two times a day (BID) | OROMUCOSAL | Status: DC
  Administered 2020-05-14 – 2020-05-18 (×10): 15 mL via OROMUCOSAL
  Filled 2020-05-14 (×6): qty 15

## 2020-05-14 MED ORDER — PIPERACILLIN-TAZOBACTAM 3.375 G IVPB
3.3750 g | Freq: Three times a day (TID) | INTRAVENOUS | Status: DC
  Administered 2020-05-14 – 2020-05-16 (×6): 3.375 g via INTRAVENOUS
  Filled 2020-05-14 (×9): qty 50

## 2020-05-14 MED ORDER — SODIUM CHLORIDE 0.9 % IV BOLUS
1000.0000 mL | Freq: Once | INTRAVENOUS | Status: AC
  Administered 2020-05-14: 1000 mL via INTRAVENOUS

## 2020-05-14 MED ORDER — ORAL CARE MOUTH RINSE
15.0000 mL | Freq: Two times a day (BID) | OROMUCOSAL | Status: DC
  Administered 2020-05-14 – 2020-05-18 (×10): 15 mL via OROMUCOSAL

## 2020-05-14 NOTE — Progress Notes (Signed)
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  I spoke to Niece Andreas Ohm who states she was unaware of CVA, CKD, severe malnutrition, was under impression that patient is doing well in SNF and at this time wishes to continue full scope of care.    Lines / Drains: PIV  Cultures / Sepsis markers: Blood cx-gram negative rods+  Antibiotics: Flagyl, cefepime, zosyn>>zosyn   Protocols / Consultants: hospitalist , pccm, surgery, pharmD  Tests / Events: CT PE, CT abd   PAST MEDICAL HISTORY   Past Medical History:  Diagnosis Date   Allergy    Anxiety    Arthritis    Diabetes mellitus without complication (HCC)    Hypertension      SURGICAL HISTORY   Past Surgical History:  Procedure Laterality Date   CATARACT EXTRACTION W/PHACO Left 04/12/2016   Procedure: CATARACT EXTRACTION PHACO AND INTRAOCULAR LENS PLACEMENT (IOC);  Surgeon: Sallee Lange, MD;  Location: [facility] ORS;  Service: Ophthalmology;  Laterality: Left;  [place]  02:06 AP% 26.8 CDE 55.05 fluid pack lot # [id number]     FAMILY HISTORY   Family History  Problem Relation Age of Onset   Asthma Mother    Anxiety disorder Mother    Lung disease Mother    COPD Sister      SOCIAL HISTORY   Social History   Tobacco Use   Smoking status: Never Smoker   Smokeless tobacco: Never Used  Vaping Use   Vaping Use: Never used  Substance Use Topics   Alcohol use: Yes     Comment: occasional   Drug use: No     MEDICATIONS   Current Medication:  Current Facility-Administered Medications:    0.9 %  sodium chloride infusion, , Intravenous, Continuous, Lorretta Harp, MD, Last Rate: 75 mL/hr at 05/14/20 0900, Rate Verify at 05/14/20 0900   0.9 %  sodium chloride infusion, 250 mL, Intravenous, Continuous, Ronnald Ramp, RPH, Last Rate: 20 mL/hr at 2020-06-05 1500, 250 mL at 06-05-2020 1500   acetaminophen (TYLENOL) tablet 650 mg, 650 mg, Oral, Q6H PRN **OR** acetaminophen (TYLENOL) suppository 650 mg, 650 mg, Rectal, Q6H PRN, Lorretta Harp, MD   chlorhexidine (PERIDEX) 0.12 % solution 15 mL, 15 mL, Mouth Rinse, BID, Scotland Korver, MD, 15 mL at 05/14/20 1021   Chlorhexidine Gluconate Cloth 2 % PADS 6 each, 6 each, Topical, Daily, Eugenie Norrie, NP, 6 each at 05/14/20 1021   insulin aspart (novoLOG) injection 0-5 Units, 0-5 Units, Subcutaneous, QHS, Niu, Xilin, MD   insulin aspart (novoLOG) injection 0-9 Units, 0-9 Units, Subcutaneous, TID WC, Lorretta Harp, MD, 2 Units at 05/14/20 1139   MEDLINE mouth rinse, 15 mL, Mouth Rinse, q12n4p, Krystol Rocco, MD, 15 mL at 05/14/20 1140   norepinephrine (LEVOPHED)  in premix infusion, 2-10 mcg/min, Intravenous, Titrated, Ronnald Ramp, RPH, Last Rate: 22.5 mL/hr at 05/14/20 1023, 6 mcg/min at 05/14/20 1023   ondansetron (ZOFRAN) injection 4 mg, 4 mg, Intravenous, Q8H PRN, Lorretta Harp, MD   piperacillin-tazobactam (ZOSYN) IVPB 3.375 g, 3.375 g, Intravenous, Q8H, Joshuajames Moehring, MD, Last Rate: 12.5 mL/hr  at 05/14/20 1057, 3.375 g at 05/14/20 1057    ALLERGIES   Tetracycline    REVIEW OF SYSTEMS    Unable to obtain due to severe encephalopathy  PHYSICAL EXAMINATION   Vital Signs: Temp:  [97.8 F (36.6 C)-98.4 F (36.9 C)] 97.8 F (36.6 C) (06/23 1400) Pulse Rate:  [53-73] 70 (06/23 1400) Resp:  [14-21] 20 (06/23 1400) BP: (65-150)/(38-120) 114/50 (06/23 1400) SpO2:  [95 %-100 %] 98 % (06/23  1400) Weight:  [64.1 kg] 64.1 kg (06/22 2330)  GENERAL:chronically ill appearing HEAD: Normocephalic, atraumatic.  EYES: Pupils equal, round, reactive to light.  No scleral icterus.  MOUTH: Moist mucosal membrane. NECK: Supple. No thyromegaly. No nodules. No JVD.  PULMONARY: decreased BS bilaterally  CARDIOVASCULAR: S1 and S2. Regular rate and rhythm. No murmurs, rubs, or gallops.  GASTROINTESTINAL: Soft, nontender, non-distended. No masses. Positive bowel sounds. No hepatosplenomegaly.  MUSCULOSKELETAL:upper and lowe extermity edema + NEUROLOGIC:GCS9 SKIN:intact,warm,dry   PERTINENT DATA     Infusions:  sodium chloride 75 mL/hr at 05/14/20 0900   sodium chloride 250 mL (02-22-2020 1500)   norepinephrine (LEVOPHED) Adult infusion 6 mcg/min (05/14/20 1023)   piperacillin-tazobactam (ZOSYN)  IV 3.375 g (05/14/20 1057)   Scheduled Medications:  chlorhexidine  15 mL Mouth Rinse BID   Chlorhexidine Gluconate Cloth  6 each Topical Daily   insulin aspart  0-5 Units Subcutaneous QHS   insulin aspart  0-9 Units Subcutaneous TID WC   mouth rinse  15 mL Mouth Rinse q12n4p   PRN Medications: acetaminophen **OR** acetaminophen, ondansetron (ZOFRAN) IV Hemodynamic parameters:   Intake/Output: 06/22 0701 - 06/23 0700 In: 3549 [I.V.:1533.9; IV Piggyback:2015.1] Out: 250 [Urine:250]  Ventilator  Settings:     LAB RESULTS:  Basic Metabolic Panel: Recent Labs  Lab 05/08/20 0528 05/08/20 0528 05/09/20 0432 05/09/20 0432 02-22-2020 0940 02-22-2020 0940 02-22-2020 1554 05/14/20 0234  NA 137  --  136  --  138  --   --  139  K 4.1   < > 4.2   < > 5.4*   < > 5.1 5.2*  CL 102  --  102  --  102  --   --  106  CO2 27  --  28  --  20*  --   --  21*  GLUCOSE 152*  --  191*  --  201*  --   --  212*  BUN 30*  --  28*  --  49*  --   --  54*  CREATININE 0.79  --  0.84  --  1.52*  --   --  1.60*  CALCIUM 8.9  --  8.7*  --  8.7*  --   --  8.0*  MG 2.0  --  2.1  --   --   --   --  2.1    PHOS  --   --   --   --   --   --   --  4.2   < > = values in this interval not displayed.   Liver Function Tests: Recent Labs  Lab 02-22-2020 0940 05/14/20 0234  AST 17 26  ALT 13 13  ALKPHOS 62 46  BILITOT 1.5* 0.9  PROT 5.1* 4.9*  ALBUMIN 1.8* 1.9*   No results for input(s): LIPASE, AMYLASE in the last 168 hours. Recent Labs  Lab 02-22-2020 1101  AMMONIA <9*   CBC: Recent Labs  Lab 05/08/20 0528 05/09/20 0432 02-22-2020 0940 05/14/20 0234  WBC 10.1 9.7 6.7  12.8*  NEUTROABS 6.9 7.2 4.9 11.6*  HGB 9.4* 9.0* 9.4* 8.7*  HCT 29.6* 28.2* 29.5* 28.0*  MCV 93.1 93.4 92.8 93.3  PLT 227 239 385 368   Cardiac Enzymes: No results for input(s): CKTOTAL, CKMB, CKMBINDEX, TROPONINI in the last 168 hours. BNP: Invalid input(s): POCBNP CBG: Recent Labs  Lab 05-20-2020 0940 05/20/2020 2242 May 20, 2020 2321 05/14/20 0727 05/14/20 1114  GLUCAP 194* 197* 215* 197* 154*       IMAGING RESULTS:  Imaging: CT Head Wo Contrast  Result Date: 05/20/2020 CLINICAL DATA:  Altered mental status of unclear cause EXAM: CT HEAD WITHOUT CONTRAST TECHNIQUE: Contiguous axial images were obtained from the base of the skull through the vertex without intravenous contrast. COMPARISON:  04/22/2020 FINDINGS: Brain: Generalized atrophy. Normal ventricular morphology. No midline shift or mass effect. Extensive small vessel chronic ischemic changes of deep cerebral white matter. Old infarcts at the deep RIGHT frontal white matter, basal ganglia and BILATERAL cerebellar hemispheres. Few scattered streak artifacts. Resolution of trace RIGHT frontal subdural hematoma seen on previous exam. 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 No intracranial hemorrhage, mass lesion or evidence of acute infarction. Vascular: Atherosclerotic calcification of internal carotid and vertebral arteries at skull base Skull: Intact Sinuses/Orbits: Partial opacification of LEFT mastoid air cells, new. Remaining  visualized paranasal sinuses and RIGHT mastoid air cells clear. Other: N/A IMPRESSION: Atrophy with small vessel chronic ischemic changes of deep cerebral white matter. Resolution of trace RIGHT frontal subdural hematoma seen on previous exam with residual small BILATERAL chronic subdural hygromas in the frontal regions bilaterally. 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 No new intracranial abnormalities. Electronically Signed   By: Ulyses Southward M.D.   On: May 20, 2020 11:23   CT Angio Chest PE W and/or Wo Contrast  Result Date: May 20, 2020 CLINICAL DATA:  Significant shortness of breath, history of known deep venous thrombosis and recent filter placement EXAM: CT ANGIOGRAPHY CHEST CT ABDOMEN AND PELVIS WITH CONTRAST TECHNIQUE: Multidetector CT imaging of the chest was performed using the standard protocol during bolus administration of intravenous contrast. Multiplanar CT image reconstructions and MIPs were obtained to evaluate the vascular anatomy. Multidetector CT imaging of the abdomen and pelvis was performed using the standard protocol during bolus administration of intravenous contrast. CONTRAST:  16mL OMNIPAQUE IOHEXOL 350 MG/ML SOLN COMPARISON:  05/04/2019 FINDINGS: CTA CHEST FINDINGS Cardiovascular: Thoracic aorta demonstrates mild atherosclerotic calcification without aneurysmal dilatation or dissection. Cardiac enlargement is noted. The pulmonary artery shows a normal branching pattern bilaterally. No sizable filling defects to suggest pulmonary emboli are seen. Coronary calcifications are noted. No sizable pericardial effusion is seen. Mediastinum/Nodes: Thoracic inlet is within normal limits. No sizable hilar or mediastinal adenopathy is noted. The esophagus is within normal limits. Lungs/Pleura: Small bilateral pleural effusions are noted right greater than left with mild associated compressive atelectasis again right greater than left.  The lungs are otherwise well aerated without focal infiltrate. No sizable parenchymal nodules are seen. No pneumothoraces are noted. Musculoskeletal: Degenerative changes of the thoracic spine are noted. No acute bony abnormality is seen. Review of the MIP images confirms the above findings. CT ABDOMEN and PELVIS FINDINGS Hepatobiliary: Liver is well visualized without focal mass lesion. The gallbladder is well distended although no gallstones are noted. Pancreas: Unremarkable. No pancreatic ductal dilatation or surrounding inflammatory changes. Spleen: Normal in size without focal abnormality. Adrenals/Urinary Tract: Adrenal glands are within normal limits. Kidneys demonstrate a normal enhancement pattern. No renal calculi or  urinary tract obstructive changes are seen. Small left renal cyst is noted. The bladder is well distended. Small focus of air is noted within the bladder consistent with recent instrumentation. Stomach/Bowel: Appendix is well visualized and within normal limits. Fecal material is noted throughout the colon. The sigmoid colon is distended with changes of diverticular disease as well as focal wall thickening consistent with focal diverticulitis. Large stool burden in the sigmoid is noted which may be related to a degree of impaction. Multiple extraluminal foci of air are noted consistent with mild perforation likely related to this focal inflammatory change. Small bowel appears within normal limits with the exception of some compensatory inflammatory change related to the sigmoid changes. Stomach is unremarkable. Vascular/Lymphatic: Atherosclerotic calcifications are noted. IVC filter is noted in place. Right-sided DVT is again noted and stable. No definitive thrombus within the IVC filter is seen. No significant lymphadenopathy is noted. Reproductive: Uterus and bilateral adnexa are unremarkable. Other: Minimal free fluid is noted related to the perforation. Minimal changes of anasarca are  noted. Musculoskeletal: Degenerative changes of lumbar spine are again seen. Stable hematoma over the right hip is noted. Review of the MIP images confirms the above findings. IMPRESSION: CTA of the chest: No evidence of pulmonary embolism. Mild bibasilar atelectasis and small effusions right greater than left. CT of the abdomen and pelvis: Changes of diverticulitis in the sigmoid colon with a large focal stool burden which may represent some impaction. Associated inflammatory changes are noted as well as findings of microperforations. Small amount of free fluid is noted as well. Distended gallbladder of uncertain significance. This is stable from the prior exam. Stable hematoma over the right hip. Electronically Signed   By: Inez Catalina M.D.   On: May 17, 2020 11:42   CT ABDOMEN PELVIS W CONTRAST  Result Date: May 17, 2020 CLINICAL DATA:  Significant shortness of breath, history of known deep venous thrombosis and recent filter placement EXAM: CT ANGIOGRAPHY CHEST CT ABDOMEN AND PELVIS WITH CONTRAST TECHNIQUE: Multidetector CT imaging of the chest was performed using the standard protocol during bolus administration of intravenous contrast. Multiplanar CT image reconstructions and MIPs were obtained to evaluate the vascular anatomy. Multidetector CT imaging of the abdomen and pelvis was performed using the standard protocol during bolus administration of intravenous contrast. CONTRAST:  95mL OMNIPAQUE IOHEXOL 350 MG/ML SOLN COMPARISON:  05/04/2019 FINDINGS: CTA CHEST FINDINGS Cardiovascular: Thoracic aorta demonstrates mild atherosclerotic calcification without aneurysmal dilatation or dissection. Cardiac enlargement is noted. The pulmonary artery shows a normal branching pattern bilaterally. No sizable filling defects to suggest pulmonary emboli are seen. Coronary calcifications are noted. No sizable pericardial effusion is seen. Mediastinum/Nodes: Thoracic inlet is within normal limits. No sizable hilar or  mediastinal adenopathy is noted. The esophagus is within normal limits. Lungs/Pleura: Small bilateral pleural effusions are noted right greater than left with mild associated compressive atelectasis again right greater than left. The lungs are otherwise well aerated without focal infiltrate. No sizable parenchymal nodules are seen. No pneumothoraces are noted. Musculoskeletal: Degenerative changes of the thoracic spine are noted. No acute bony abnormality is seen. Review of the MIP images confirms the above findings. CT ABDOMEN and PELVIS FINDINGS Hepatobiliary: Liver is well visualized without focal mass lesion. The gallbladder is well distended although no gallstones are noted. Pancreas: Unremarkable. No pancreatic ductal dilatation or surrounding inflammatory changes. Spleen: Normal in size without focal abnormality. Adrenals/Urinary Tract: Adrenal glands are within normal limits. Kidneys demonstrate a normal enhancement pattern. No renal calculi or urinary tract obstructive changes  are seen. Small left renal cyst is noted. The bladder is well distended. Small focus of air is noted within the bladder consistent with recent instrumentation. Stomach/Bowel: Appendix is well visualized and within normal limits. Fecal material is noted throughout the colon. The sigmoid colon is distended with changes of diverticular disease as well as focal wall thickening consistent with focal diverticulitis. Large stool burden in the sigmoid is noted which may be related to a degree of impaction. Multiple extraluminal foci of air are noted consistent with mild perforation likely related to this focal inflammatory change. Small bowel appears within normal limits with the exception of some compensatory inflammatory change related to the sigmoid changes. Stomach is unremarkable. Vascular/Lymphatic: Atherosclerotic calcifications are noted. IVC filter is noted in place. Right-sided DVT is again noted and stable. No definitive thrombus  within the IVC filter is seen. No significant lymphadenopathy is noted. Reproductive: Uterus and bilateral adnexa are unremarkable. Other: Minimal free fluid is noted related to the perforation. Minimal changes of anasarca are noted. Musculoskeletal: Degenerative changes of lumbar spine are again seen. Stable hematoma over the right hip is noted. Review of the MIP images confirms the above findings. IMPRESSION: CTA of the chest: No evidence of pulmonary embolism. Mild bibasilar atelectasis and small effusions right greater than left. CT of the abdomen and pelvis: Changes of diverticulitis in the sigmoid colon with a large focal stool burden which may represent some impaction. Associated inflammatory changes are noted as well as findings of microperforations. Small amount of free fluid is noted as well. Distended gallbladder of uncertain significance. This is stable from the prior exam. Stable hematoma over the right hip. Electronically Signed   By: Alcide Clever M.D.   On: 04/22/2020 11:42   DG Chest Portable 1 View  Result Date: 05/15/2020 CLINICAL DATA:  Severe shortness of breath EXAM: PORTABLE CHEST 1 VIEW COMPARISON:  Portable exam 0956 hours compared to 04/20/2020 FINDINGS: Enlargement of cardiac silhouette. Mediastinal contours and pulmonary vascularity normal. Atherosclerotic calcification aorta. Minimal bibasilar opacities question atelectasis versus early infiltrate. Remaining lungs clear. No pleural effusion or pneumothorax. Bones demineralized. IMPRESSION: Enlargement of cardiac silhouette. Minimal bibasilar opacities question atelectasis versus infiltrate. Electronically Signed   By: Ulyses Southward M.D.   On: 05/07/2020 10:33   @PROBHOSP @ No results found.   ASSESSMENT AND PLAN    -Multidisciplinary rounds held today  Septic shock - present on admission  -due to intraabdominal infection with diverticulitis and gram negative rod bacteremia -lactate >3 and procal >14 -continue current  abx-Zosyn - appreciate pharmacy collaboration -use vasopressors to keep MAP>65-currently low dose levophed   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 with background of diabetic nephropathy -follow chem 7 -follow UO -continue Foley Catheter-assess need daily  DVT of RLE  S/p IVC filter   ID -continue IV abx as prescibed -follow up cultures  GI/Nutrition GI PROPHYLAXIS as indicated DIET-->TF's as tolerated Constipation protocol as indicated  ENDO - ICU hypoglycemic\Hyperglycemia protocol -check FSBS per protocol   ELECTROLYTES -follow labs as needed -replace as needed -pharmacy consultation   DVT/GI PRX ordered -SCDs  TRANSFUSIONS AS NEEDED MONITOR FSBS ASSESS the need for LABS as needed   Critical care provider statement:    Critical care time (minutes):  34   Critical care time was exclusive of:  Separately billable procedures  and treating other patients   Critical care was necessary to treat or prevent imminent or life-threatening deterioration of the following conditions:  acute diverticulitis, septic shock, hx of CVA,  Hx of RLE DVT, acute renal failure on chronic renal failure,    Critical care was time spent personally by me on the following activities:  Development of treatment plan with patient or surrogate, discussions with consultants, evaluation of patient's response to treatment, examination of patient, obtaining history from patient or surrogate, ordering and performing treatments and interventions, ordering and review of laboratory studies and re-evaluation of patient's condition.  I assumed direction of critical care for this patient from another provider in my specialty: no    This document was prepared using Dragon voice recognition software and may  include unintentional dictation errors.    Vida Rigger, M.D.  Division of Pulmonary & Critical Care Medicine  [facility]

## 2020-05-14 NOTE — Progress Notes (Signed)
PHARMACY - PHYSICIAN COMMUNICATION CRITICAL VALUE ALERT - BLOOD CULTURE IDENTIFICATION (BCID)  Sierra Lloyd is an 86 y.o. female who presented to [facility] on 15-May-2020  Assessment:  1 set coag neg staph, mec A- AND 1 bottle GNR (no BCID) **Klebsiella pneumoniae in urine, however this would have shown up on BCID if this was GNR in blood**  Name of physician (or Provider) Contacted: Dr. [name]  Current antibiotics: Zosyn  Changes to prescribed antibiotics recommended: continue Zosyn  Results for orders placed or performed during the hospital encounter of 05-15-20  Blood Culture ID Panel (Reflexed) (Collected: May 15, 2020  9:40 AM)  Result Value Ref Range   Enterococcus species NOT DETECTED NOT DETECTED   Listeria monocytogenes NOT DETECTED NOT DETECTED   Staphylococcus species DETECTED (A) NOT DETECTED   Staphylococcus aureus (BCID) NOT DETECTED NOT DETECTED   Methicillin resistance NOT DETECTED NOT DETECTED   Streptococcus species NOT DETECTED NOT DETECTED   Streptococcus agalactiae NOT DETECTED NOT DETECTED   Streptococcus pneumoniae NOT DETECTED NOT DETECTED   Streptococcus pyogenes NOT DETECTED NOT DETECTED   Acinetobacter baumannii NOT DETECTED NOT DETECTED   Enterobacteriaceae species NOT DETECTED NOT DETECTED   Enterobacter cloacae complex NOT DETECTED NOT DETECTED   Escherichia coli NOT DETECTED NOT DETECTED   Klebsiella oxytoca NOT DETECTED NOT DETECTED   Klebsiella pneumoniae NOT DETECTED NOT DETECTED   Proteus species NOT DETECTED NOT DETECTED   Serratia marcescens NOT DETECTED NOT DETECTED   Haemophilus influenzae NOT DETECTED NOT DETECTED   Neisseria meningitidis NOT DETECTED NOT DETECTED   Pseudomonas aeruginosa NOT DETECTED NOT DETECTED   Candida albicans NOT DETECTED NOT DETECTED   Candida glabrata NOT DETECTED NOT DETECTED   Candida krusei NOT DETECTED NOT DETECTED   Candida parapsilosis NOT DETECTED NOT DETECTED   Candida tropicalis NOT DETECTED NOT  DETECTED    Pricilla Riffle, PharmD 05/14/2020  3:07 PM

## 2020-05-14 NOTE — Progress Notes (Signed)
[facility] hospital liaison note:  Patient has a pending referral for [facility] community Palliative to follow at [facility] from her last [facility] admission. TOC Misty Green made aware. Dayna Barker BSN, RN, [facility] [phone]

## 2020-05-14 NOTE — Progress Notes (Signed)
Pt off levo since 1300. Maps holding steady. Pt responsive to voice but not following commands. Pts urine output over entire shift. Pt still on RA and NSR.

## 2020-05-14 NOTE — Progress Notes (Signed)
CC: Diverticulitis Subjective: Now some pressors intermittently, no emesis. AMS unable to have a conversation w patient  Objective: Vital signs in last 24 hours: Temp:  [97.8 F (36.6 C)-98.4 F (36.9 C)] 97.8 F (36.6 C) (06/23 1400) Pulse Rate:  [58-75] 75 (06/23 1500) Resp:  [14-21] 19 (06/23 1500) BP: (65-150)/(40-120) 102/53 (06/23 1500) SpO2:  [95 %-100 %] 100 % (06/23 1500) Weight:  [64.1 kg] 64.1 kg (06/22 2330) Last BM Date: 05/09/20  Intake/Output from previous day: 06/22 0701 - 06/23 0700 In: 3549 [I.V.:1533.9; IV Piggyback:2015.1] Out: 250 [Urine:250] Intake/Output this shift: Total I/O In: 308.6 [I.V.:308.6] Out: 110 [Urine:110]  Physical exam: Somnolent, open eyes intermittent Abd: soft, non tender, much improved as compared to yesterday, no peritonitits  Lab Results: CBC  Recent Labs    05/16/2020 0940 05/14/20 0234  WBC 6.7 12.8*  HGB 9.4* 8.7*  HCT 29.5* 28.0*  PLT 385 368   BMET Recent Labs    04/23/2020 0940 04/29/2020 0940 05/08/2020 1554 05/14/20 0234  NA 138  --   --  139  K 5.4*   < > 5.1 5.2*  CL 102  --   --  106  CO2 20*  --   --  21*  GLUCOSE 201*  --   --  212*  BUN 49*  --   --  54*  CREATININE 1.52*  --   --  1.60*  CALCIUM 8.7*  --   --  8.0*   < > = values in this interval not displayed.   PT/INR Recent Labs    05/03/2020 0940  LABPROT 13.3  INR 1.1   ABG Recent Labs    05/04/2020 1101  HCO3 20.0    Studies/Results: CT Head Wo Contrast  Result Date: 04/25/2020 CLINICAL DATA:  Altered mental status of unclear cause EXAM: CT HEAD WITHOUT CONTRAST TECHNIQUE: Contiguous axial images were obtained from the base of the skull through the vertex without intravenous contrast. COMPARISON:  04/22/2020 FINDINGS: Brain: Generalized atrophy. Normal ventricular morphology. No midline shift or mass effect. Extensive small vessel chronic ischemic changes of deep cerebral white matter. 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 Few scattered streak artifacts. Resolution of trace RIGHT frontal subdural hematoma seen on previous exam. 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 No intracranial hemorrhage, mass lesion or evidence of acute infarction. Vascular: Atherosclerotic calcification of internal carotid and vertebral arteries at skull base Skull: Intact Sinuses/Orbits: Partial opacification of LEFT mastoid air cells, new. Remaining visualized paranasal sinuses and RIGHT mastoid air cells clear. Other: N/A IMPRESSION: Atrophy with small vessel chronic ischemic changes of deep cerebral white matter. Resolution of trace RIGHT frontal subdural hematoma seen on previous exam with residual small BILATERAL chronic subdural hygromas in the frontal regions bilaterally. 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 No new intracranial abnormalities. Electronically Signed   By: Ulyses Southward M.D.   On: 05/02/2020 11:23   CT Angio Chest PE W and/or Wo Contrast  Result Date: 05/16/2020 CLINICAL DATA:  Significant shortness of breath, history of known deep venous thrombosis and recent filter placement EXAM: CT ANGIOGRAPHY CHEST CT ABDOMEN AND PELVIS WITH CONTRAST TECHNIQUE: Multidetector CT imaging of the chest was performed using the standard protocol during bolus administration of intravenous contrast. Multiplanar CT image reconstructions and MIPs were obtained to evaluate the vascular anatomy. Multidetector CT imaging of the abdomen and pelvis was performed using the standard  protocol during bolus administration of intravenous contrast. CONTRAST:  60mL OMNIPAQUE IOHEXOL 350 MG/ML SOLN COMPARISON:  05/04/2019 FINDINGS: CTA CHEST FINDINGS Cardiovascular: Thoracic aorta demonstrates mild atherosclerotic calcification without aneurysmal dilatation or dissection. Cardiac enlargement is noted. The pulmonary artery shows a  normal branching pattern bilaterally. No sizable filling defects to suggest pulmonary emboli are seen. Coronary calcifications are noted. No sizable pericardial effusion is seen. Mediastinum/Nodes: Thoracic inlet is within normal limits. No sizable hilar or mediastinal adenopathy is noted. The esophagus is within normal limits. Lungs/Pleura: Small bilateral pleural effusions are noted right greater than left with mild associated compressive atelectasis again right greater than left. The lungs are otherwise well aerated without focal infiltrate. No sizable parenchymal nodules are seen. No pneumothoraces are noted. Musculoskeletal: Degenerative changes of the thoracic spine are noted. No acute bony abnormality is seen. Review of the MIP images confirms the above findings. CT ABDOMEN and PELVIS FINDINGS Hepatobiliary: Liver is well visualized without focal mass lesion. The gallbladder is well distended although no gallstones are noted. Pancreas: Unremarkable. No pancreatic ductal dilatation or surrounding inflammatory changes. Spleen: Normal in size without focal abnormality. Adrenals/Urinary Tract: Adrenal glands are within normal limits. Kidneys demonstrate a normal enhancement pattern. No renal calculi or urinary tract obstructive changes are seen. Small left renal cyst is noted. The bladder is well distended. Small focus of air is noted within the bladder consistent with recent instrumentation. Stomach/Bowel: Appendix is well visualized and within normal limits. Fecal material is noted throughout the colon. The sigmoid colon is distended with changes of diverticular disease as well as focal wall thickening consistent with focal diverticulitis. Large stool burden in the sigmoid is noted which may be related to a degree of impaction. Multiple extraluminal foci of air are noted consistent with mild perforation likely related to this focal inflammatory change. Small bowel appears within normal limits with the exception  of some compensatory inflammatory change related to the sigmoid changes. Stomach is unremarkable. Vascular/Lymphatic: Atherosclerotic calcifications are noted. IVC filter is noted in place. Right-sided DVT is again noted and stable. No definitive thrombus within the IVC filter is seen. No significant lymphadenopathy is noted. Reproductive: Uterus and bilateral adnexa are unremarkable. Other: Minimal free fluid is noted related to the perforation. Minimal changes of anasarca are noted. Musculoskeletal: Degenerative changes of lumbar spine are again seen. Stable hematoma over the right hip is noted. Review of the MIP images confirms the above findings. IMPRESSION: CTA of the chest: No evidence of pulmonary embolism. Mild bibasilar atelectasis and small effusions right greater than left. CT of the abdomen and pelvis: Changes of diverticulitis in the sigmoid colon with a large focal stool burden which may represent some impaction. Associated inflammatory changes are noted as well as findings of microperforations. Small amount of free fluid is noted as well. Distended gallbladder of uncertain significance. This is stable from the prior exam. Stable hematoma over the right hip. Electronically Signed   By: Alcide Clever M.D.   On: 2020/05/14 11:42   CT ABDOMEN PELVIS W CONTRAST  Result Date: 05-14-20 CLINICAL DATA:  Significant shortness of breath, history of known deep venous thrombosis and recent filter placement EXAM: CT ANGIOGRAPHY CHEST CT ABDOMEN AND PELVIS WITH CONTRAST TECHNIQUE: Multidetector CT imaging of the chest was performed using the standard protocol during bolus administration of intravenous contrast. Multiplanar CT image reconstructions and MIPs were obtained to evaluate the vascular anatomy. Multidetector CT imaging of the abdomen and pelvis was performed using the standard protocol during bolus administration  of intravenous contrast. CONTRAST:  49mL OMNIPAQUE IOHEXOL 350 MG/ML SOLN COMPARISON:   05/04/2019 FINDINGS: CTA CHEST FINDINGS Cardiovascular: Thoracic aorta demonstrates mild atherosclerotic calcification without aneurysmal dilatation or dissection. Cardiac enlargement is noted. The pulmonary artery shows a normal branching pattern bilaterally. No sizable filling defects to suggest pulmonary emboli are seen. Coronary calcifications are noted. No sizable pericardial effusion is seen. Mediastinum/Nodes: Thoracic inlet is within normal limits. No sizable hilar or mediastinal adenopathy is noted. The esophagus is within normal limits. Lungs/Pleura: Small bilateral pleural effusions are noted right greater than left with mild associated compressive atelectasis again right greater than left. The lungs are otherwise well aerated without focal infiltrate. No sizable parenchymal nodules are seen. No pneumothoraces are noted. Musculoskeletal: Degenerative changes of the thoracic spine are noted. No acute bony abnormality is seen. Review of the MIP images confirms the above findings. CT ABDOMEN and PELVIS FINDINGS Hepatobiliary: Liver is well visualized without focal mass lesion. The gallbladder is well distended although no gallstones are noted. Pancreas: Unremarkable. No pancreatic ductal dilatation or surrounding inflammatory changes. Spleen: Normal in size without focal abnormality. Adrenals/Urinary Tract: Adrenal glands are within normal limits. Kidneys demonstrate a normal enhancement pattern. No renal calculi or urinary tract obstructive changes are seen. Small left renal cyst is noted. The bladder is well distended. Small focus of air is noted within the bladder consistent with recent instrumentation. Stomach/Bowel: Appendix is well visualized and within normal limits. Fecal material is noted throughout the colon. The sigmoid colon is distended with changes of diverticular disease as well as focal wall thickening consistent with focal diverticulitis. Large stool burden in the sigmoid is noted which may  be related to a degree of impaction. Multiple extraluminal foci of air are noted consistent with mild perforation likely related to this focal inflammatory change. Small bowel appears within normal limits with the exception of some compensatory inflammatory change related to the sigmoid changes. Stomach is unremarkable. Vascular/Lymphatic: Atherosclerotic calcifications are noted. IVC filter is noted in place. Right-sided DVT is again noted and stable. No definitive thrombus within the IVC filter is seen. No significant lymphadenopathy is noted. Reproductive: Uterus and bilateral adnexa are unremarkable. Other: Minimal free fluid is noted related to the perforation. Minimal changes of anasarca are noted. Musculoskeletal: Degenerative changes of lumbar spine are again seen. Stable hematoma over the right hip is noted. Review of the MIP images confirms the above findings. IMPRESSION: CTA of the chest: No evidence of pulmonary embolism. Mild bibasilar atelectasis and small effusions right greater than left. CT of the abdomen and pelvis: Changes of diverticulitis in the sigmoid colon with a large focal stool burden which may represent some impaction. Associated inflammatory changes are noted as well as findings of microperforations. Small amount of free fluid is noted as well. Distended gallbladder of uncertain significance. This is stable from the prior exam. Stable hematoma over the right hip. Electronically Signed   By: Alcide Clever M.D.   On: 05/05/2020 11:42   DG Chest Portable 1 View  Result Date: 05/18/2020 CLINICAL DATA:  Severe shortness of breath EXAM: PORTABLE CHEST 1 VIEW COMPARISON:  Portable exam 0956 hours compared to 04/20/2020 FINDINGS: Enlargement of cardiac silhouette. Mediastinal contours and pulmonary vascularity normal. Atherosclerotic calcification aorta. Minimal bibasilar opacities question atelectasis versus early infiltrate. Remaining lungs clear. No pleural effusion or pneumothorax. Bones  demineralized. IMPRESSION: Enlargement of cardiac silhouette. Minimal bibasilar opacities question atelectasis versus infiltrate. Electronically Signed   By: Ulyses Southward M.D.   On: 05/12/2020 10:33  ECHOCARDIOGRAM COMPLETE  Result Date: 05/14/2020    ECHOCARDIOGRAM REPORT   Patient Name:   Sierra Lloyd Date of Exam: 05/14/2020 Medical Rec #:  [id number]        Height:       61.0 in Accession #:    [id number]       Weight:       141.3 lb Date of Birth:  Oct 07, 1934         BSA:          1.630 m Patient Age:    86 years         BP:           89/55 mmHg Patient Gender: F                HR:           66 bpm. Exam Location:  [facility] Procedure: 2D Echo, Cardiac Doppler and Color Doppler Indications:     CHF- acute systolic 428.21  History:         Patient has no prior history of Echocardiogram examinations.                  Risk Factors:Hypertension and Diabetes.  Sonographer:     Cristela Blue RDCS (AE) Referring Phys:  [id number] Vida Rigger Diagnosing Phys: Alwyn Pea MD IMPRESSIONS  1. Left ventricular ejection fraction, by estimation, is 60 to 65%. The left ventricle has normal function. The left ventricle has no regional wall motion abnormalities. The left ventricular internal cavity size was moderately dilated. Left ventricular diastolic parameters are consistent with Grade II diastolic dysfunction (pseudonormalization).  2. Right ventricular systolic function is moderately reduced. The right ventricular size is moderately enlarged. There is moderately elevated pulmonary artery systolic pressure.  3. Left atrial size was moderately dilated.  4. Right atrial size was mild to moderately dilated.  5. The mitral valve is grossly normal. Mild to moderate mitral valve regurgitation.  6. The aortic valve is grossly normal. Aortic valve regurgitation is not visualized. FINDINGS  Left Ventricle: Left ventricular ejection fraction, by estimation, is 60 to 65%. The left ventricle has normal function. The left ventricle  has no regional wall motion abnormalities. The left ventricular internal cavity size was moderately dilated. There is no left ventricular hypertrophy. Left ventricular diastolic parameters are consistent with Grade II diastolic dysfunction (pseudonormalization). Right Ventricle: The right ventricular size is moderately enlarged. No increase in right ventricular wall thickness. Right ventricular systolic function is moderately reduced. There is moderately elevated pulmonary artery systolic pressure. The tricuspid  regurgitant velocity is 3.26 m/s, and with an assumed right atrial pressure of 10 mmHg, the estimated right ventricular systolic pressure is 52.5 mmHg. Left Atrium: Left atrial size was moderately dilated. Right Atrium: Right atrial size was mild to moderately dilated. Pericardium: There is no evidence of pericardial effusion. Mitral Valve: The mitral valve is grossly normal. Mild to moderate mitral valve regurgitation. Tricuspid Valve: The tricuspid valve is normal in structure. Tricuspid valve regurgitation is mild. Aortic Valve: The aortic valve is grossly normal. Aortic valve regurgitation is not visualized. Aortic valve mean gradient measures 3.0 mmHg. Aortic valve peak gradient measures 4.8 mmHg. Aortic valve area, by VTI measures 2.14 cm. Pulmonic Valve: The pulmonic valve was normal in structure. Pulmonic valve regurgitation is not visualized. Aorta: The aortic root is normal in size and structure. IAS/Shunts: No atrial level shunt detected by color flow Doppler.  LEFT VENTRICLE PLAX 2D LVIDd:  5.10 cm  Diastology LVIDs:         3.40 cm  LV e' lateral:   8.27 cm/s LV PW:         0.77 cm  LV E/e' lateral: 11.2 LV IVS:        0.69 cm  LV e' medial:    6.85 cm/s LVOT diam:     2.00 cm  LV E/e' medial:  13.6 LV SV:         48 LV SV Index:   30 LVOT Area:     3.14 cm  RIGHT VENTRICLE RV Basal diam:  3.84 cm RV S prime:     11.70 cm/s TAPSE (M-mode): 4.1 cm LEFT ATRIUM             Index        RIGHT ATRIUM           Index LA diam:        3.60 cm 2.21 cm/m  RA Area:     13.70 cm LA Vol (A2C):   87.0 ml 53.39 ml/m RA Volume:   31.30 ml  19.21 ml/m LA Vol (A4C):   55.3 ml 33.94 ml/m LA Biplane Vol: 69.1 ml 42.40 ml/m  AORTIC VALVE AV Area (Vmax):    1.77 cm AV Area (Vmean):   2.01 cm AV Area (VTI):     2.14 cm AV Vmax:           110.00 cm/s AV Vmean:          75.450 cm/s AV VTI:            0.226 m AV Peak Grad:      4.8 mmHg AV Mean Grad:      3.0 mmHg LVOT Vmax:         62.00 cm/s LVOT Vmean:        48.200 cm/s LVOT VTI:          0.154 m LVOT/AV VTI ratio: 0.68  AORTA Ao Root diam: 2.70 cm MITRAL VALVE               TRICUSPID VALVE MV Area (PHT): 5.38 cm    TR Peak grad:   42.5 mmHg MV Decel Time: 141 msec    TR Vmax:        326.00 cm/s MV E velocity: 93.00 cm/s MV A velocity: 61.70 cm/s  SHUNTS MV E/A ratio:  1.51        Systemic VTI:  0.15 m                            Systemic Diam: 2.00 cm Dwayne Salome Arnt Callwood MD Electronically signed by Alwyn Peawayne D Callwood MD Signature Date/Time: 05/14/2020/4:21:43 PM    Final     Anti-infectives: Anti-infectives (From admission, onward)   Start     Dose/Rate Route Frequency Ordered Stop   05/14/20 1000  piperacillin-tazobactam (ZOSYN) IVPB 3.375 g  Status:  Discontinued        3.375 g 12.5 mL/hr over 240 Minutes Intravenous Every 12 hours 04/25/2020 1230 05/20/2020 1235   05/14/20 1000  piperacillin-tazobactam (ZOSYN) IVPB 3.375 g     Discontinue     3.375 g 12.5 mL/hr over 240 Minutes Intravenous Every 8 hours 05/14/20 0848     05/20/2020 2200  piperacillin-tazobactam (ZOSYN) IVPB 3.375 g  Status:  Discontinued        3.375 g 12.5 mL/hr over 240 Minutes Intravenous Every  12 hours 05/01/2020 1235 05/14/20 0848   05/10/2020 1600  metroNIDAZOLE (FLAGYL) IVPB 500 mg  Status:  Discontinued        500 mg 100 mL/hr over 60 Minutes Intravenous Every 12 hours 04/28/2020 1545 05/16/2020 1555   05/10/2020 0945  ceFEPIme (MAXIPIME) 2 g in sodium chloride 0.9 % 100 mL IVPB         2 g 200 mL/hr over 30 Minutes Intravenous  Once 05/15/2020 0943 05/06/2020 1100   04/26/2020 0945  metroNIDAZOLE (FLAGYL) IVPB 500 mg        500 mg 100 mL/hr over 60 Minutes Intravenous  Once 05/06/2020 0943 04/23/2020 1223   04/22/2020 0945  vancomycin (VANCOCIN) IVPB 1000 mg/200 mL premix        1,000 mg 200 mL/hr over 60 Minutes Intravenous  Once 04/25/2020 7017 05/02/2020 1228      Assessment/Plan:  Diverticulitis on debilitated pt, w AMS, dementia. Her abdominal exam is better today however her physiology is still critical. Continue A/Bs and fluid resuscitation. No need for emergency surgery and she is Not a surgical candidate , we will continue to follow. I spent 25 min in this encounter w > 50% spent in coordination and counseling of her care  Caroleen Hamman, MD, [facility]  05/14/2020

## 2020-05-14 NOTE — Progress Notes (Signed)
[facility] transfer center on call General Surgeon has refused pt transfer.  On call General Surgeon stated the pt would NOT be a surgical candidate, and agrees with the current treatment plan at [facility].  I notified pts niece Edwinna Areola regarding [facility]'s refusal to accept pt transfer, Ms. Tenny Craw stated "call [facility] and if they refuse call [facility]." Therefore, I will contact [facility] first.  Will continue to monitor and assess pt.  Sonda Rumble, AGNP  Pulmonary/Critical Care Pager [phone] (please enter 7 digits) PCCM Consult Pager [phone] (please enter 7 digits)

## 2020-05-14 NOTE — Progress Notes (Signed)
*  PRELIMINARY RESULTS* Echocardiogram 2D Echocardiogram has been performed.  Sierra Lloyd 05/14/2020, 1:45 PM

## 2020-05-14 NOTE — Plan of Care (Signed)

## 2020-05-14 NOTE — Progress Notes (Signed)
I spoke with [facility] transfer center and discussed case with on call General Surgery.  He stated he agrees with current plan of care at [facility].  He stated Ms. Conrad greatest chance of survival is NOT to perform surgery due to the fact there is NO indication for surgery at this time.  He also stated due to the fact the pt is no longer requiring the levophed gtt it is likely the microperforation has sealed off.  He agrees with continuing antibiotic therapy, and stated at this time the pt will NOT be accepted as a pt at [facility].  Will continue to monitor and assess pt.   Sonda Rumble, AGNP  Pulmonary/Critical Care Pager [phone] (please enter 7 digits) PCCM Consult Pager [phone] (please enter 7 digits)

## 2020-05-14 NOTE — Progress Notes (Signed)
2000: [name] (niece) at bedside. Asked me when I entered the room if I had what she needed. She was very frustrated and said she needed the H&P and access to her Aunt's MyChart. Spoke with the charge RN Beth and NP Annabelle Harman and was told this was not done on our end but gave the niece the website. I got the medical records phone number for her and called the chaplain to help with any additional information pertaining to medical power of attorney. It was after visiting hours but I told her she could stay until the chaplain was available which was going to be about 30 minutes. She asked if he was going to get her the H&P and when I told her "no, but may be able to help with the steps on how to do so," she got upset and said she was going to leave and not waste her time. Stated everyone was pushing papers at her to make decisions such as making Kaya a DNR but that no one would print her off any information for her to make on informed decision. Offered to have NP Annabelle Harman come in and speak with her but she did not want anyone else to talk to her since "everyone is telling me something different." On exit, she told the patient "I am going to get you out of here to a place where they will actually take care of you." Notified NP Annabelle Harman and [employer] of the situation. [name] later called requesting transfer.

## 2020-05-14 NOTE — Progress Notes (Signed)
I spoke with pts niece Edwinna Areola via telephone and informed her [facility] has refused to accept pt for transfer, and also informed her [facility] agreed with current plan of care.  Ms. Tenny Craw stated she is frustrated because she feels she has not been given enough information regarding plan of care and pt hx.  I explained current treatment plan, course of illness, and pts PMH. Ms. Tenny Craw was very appreciative for the updates/information.  Ms. Tenny Craw would like to speak with ICU Intensivist regarding pt condition/prognosis, and would also like General Surgery to explain why the pt is not a surgical candidate.  In addition, she would like to see if she could setup pts MyChart so she can have access to pts H&P.   I will speak with Dr. [name] in the morning regarding setting up a meeting with pts niece on 05/14/2020 to discuss treatment plan, prognosis, and code status.  I also will speak with the on coming dayshift RN to determine if Ms. Ross can set up pts MyChart.    Sonda Rumble, AGNP  Pulmonary/Critical Care Pager [phone] (please enter 7 digits) PCCM Consult Pager [phone] (please enter 7 digits)

## 2020-05-14 NOTE — Progress Notes (Signed)
Pts niece Edwinna Areola called ICU requesting the pt be transferred to [facility] hospital immediately so she can have surgery tonight.  She stated "You are letting her just lay there and die and not letting her have surgery when she is stable to have surgery for the microperforation."  Edwinna Areola stated "if you do not call [facility] and get her transferred tonight so she can have surgery I will contact Theodosia Quay and my attorney." I informed Ms. Edwinna Areola I would contact [facility] transfer center immediately and attempt to get the pt transferred tonight.  However, I also told Ms. Edwinna Areola the pt can only be transferred to [facility] if she has an accepting [facility] physician.  Ms. Tenny Craw asked me how long would this take, and I told her once I call [facility] transfer center they will call me back to let me know what they have decided. I reassured her once [facility] called me back to let me know if they will accept Ms. Bellard as a pt I would call her back.  I spoke with [facility] transfer center and at this time they are reviewing pt info to determine if pt can be transferred to their facility.  Pt currently resting in bed in no acute distress and remains off of levophed gtt.  Will continue to monitor and assess pt.   Sonda Rumble, AGNP  Pulmonary/Critical Care Pager [phone] (please enter 7 digits) PCCM Consult Pager [phone] (please enter 7 digits)

## 2020-05-15 ENCOUNTER — Other Ambulatory Visit: Payer: Self-pay

## 2020-05-15 ENCOUNTER — Encounter: Payer: Self-pay | Admitting: Internal Medicine

## 2020-05-15 DIAGNOSIS — Z7189 Other specified counseling: Secondary | ICD-10-CM

## 2020-05-15 DIAGNOSIS — Z515 Encounter for palliative care: Secondary | ICD-10-CM

## 2020-05-15 DIAGNOSIS — E43 Unspecified severe protein-calorie malnutrition: Secondary | ICD-10-CM

## 2020-05-15 DIAGNOSIS — R6521 Severe sepsis with septic shock: Secondary | ICD-10-CM

## 2020-05-15 LAB — CBC WITH DIFFERENTIAL/PLATELET
Abs Immature Granulocytes: 0.12 10*3/uL — ABNORMAL HIGH (ref 0.00–0.07)
Basophils Absolute: 0 10*3/uL (ref 0.0–0.1)
Basophils Relative: 0 %
Eosinophils Absolute: 0 10*3/uL (ref 0.0–0.5)
Eosinophils Relative: 0 %
HCT: 26.6 % — ABNORMAL LOW (ref 36.0–46.0)
Hemoglobin: 8.5 g/dL — ABNORMAL LOW (ref 12.0–15.0)
Immature Granulocytes: 1 %
Lymphocytes Relative: 9 %
Lymphs Abs: 0.9 10*3/uL (ref 0.7–4.0)
MCH: 29.3 pg (ref 26.0–34.0)
MCHC: 32 g/dL (ref 30.0–36.0)
MCV: 91.7 fL (ref 80.0–100.0)
Monocytes Absolute: 0.2 10*3/uL (ref 0.1–1.0)
Monocytes Relative: 2 %
Neutro Abs: 9.1 10*3/uL — ABNORMAL HIGH (ref 1.7–7.7)
Neutrophils Relative %: 88 %
Platelets: 263 10*3/uL (ref 150–400)
RBC: 2.9 MIL/uL — ABNORMAL LOW (ref 3.87–5.11)
RDW: 15.7 % — ABNORMAL HIGH (ref 11.5–15.5)
Smear Review: NORMAL
WBC Morphology: INCREASED
WBC: 10.3 10*3/uL (ref 4.0–10.5)
nRBC: 0.2 % (ref 0.0–0.2)

## 2020-05-15 LAB — PHOSPHORUS: Phosphorus: 3.6 mg/dL (ref 2.5–4.6)

## 2020-05-15 LAB — URINE CULTURE: Culture: >=100000 — AB

## 2020-05-15 LAB — GLUCOSE, CAPILLARY
Glucose-Capillary: 143 mg/dL — ABNORMAL HIGH (ref 70–99)
Glucose-Capillary: 156 mg/dL — ABNORMAL HIGH (ref 70–99)
Glucose-Capillary: 148 mg/dL — ABNORMAL HIGH (ref 70–99)
Glucose-Capillary: 146 mg/dL — ABNORMAL HIGH (ref 70–99)
Glucose-Capillary: 149 mg/dL — ABNORMAL HIGH (ref 70–99)
Glucose-Capillary: 137 mg/dL — ABNORMAL HIGH (ref 70–99)

## 2020-05-15 LAB — LACTIC ACID, PLASMA: Lactic Acid, Venous: 2.2 mmol/L (ref 0.5–1.9)

## 2020-05-15 LAB — BASIC METABOLIC PANEL
Anion gap: 10 (ref 5–15)
BUN: 64 mg/dL — ABNORMAL HIGH (ref 8–23)
CO2: 22 mmol/L (ref 22–32)
Calcium: 7.6 mg/dL — ABNORMAL LOW (ref 8.9–10.3)
Chloride: 109 mmol/L (ref 98–111)
Creatinine, Ser: 1.71 mg/dL — ABNORMAL HIGH (ref 0.44–1.00)
GFR calc Af Amer: 31 mL/min — ABNORMAL LOW (ref >60)
GFR calc non Af Amer: 27 mL/min — ABNORMAL LOW (ref >60)
Glucose, Bld: 156 mg/dL — ABNORMAL HIGH (ref 70–99)
Potassium: 4.7 mmol/L (ref 3.5–5.1)
Sodium: 141 mmol/L (ref 135–145)

## 2020-05-15 LAB — MAGNESIUM: Magnesium: 2.2 mg/dL (ref 1.7–2.4)

## 2020-05-15 LAB — PROCALCITONIN: Procalcitonin: 21.25 ng/mL

## 2020-05-15 MED ORDER — SODIUM CHLORIDE 0.9 % IV BOLUS
1000.0000 mL | Freq: Once | INTRAVENOUS | Status: AC
  Administered 2020-05-15: 1000 mL via INTRAVENOUS

## 2020-05-15 MED ORDER — ALBUMIN HUMAN 25 % IV SOLN
12.5000 g | Freq: Every day | INTRAVENOUS | Status: DC
  Administered 2020-05-15 – 2020-05-18 (×4): 12.5 g via INTRAVENOUS
  Filled 2020-05-15 (×4): qty 50

## 2020-05-15 NOTE — Progress Notes (Signed)
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  I spoke to Niece Sierra Lloyd who states she was unaware of CVA, CKD, severe malnutrition, was under impression that patient is doing well in SNF and at this time wishes to continue full scope of care.    Lines / Drains: PIV  Cultures / Sepsis markers: Blood cx-gram negative rods+  Antibiotics: Flagyl, cefepime, zosyn>>zosyn   Protocols / Consultants: hospitalist , pccm, surgery, pharmD  Tests / Events: CT PE, CT abd  05/16/20- patient is minimally responsive on exam this am, she is has decreased UOP despite IVF since admission, she is septic and critically ill with overall poor prognosis due to multiple comorbid conditions with advanced age.  I discussed care plan with patients niece Sierra Lloyd) who states she does not have power of attorney but has been involved with medical decisions.  She has asked me to release medical records to her so she can review history and make decisions regarding GOC.  I did state to her that physicians do not release records as this is something available without MD involvement and it would be very helpful to [place] if she is able to review everything. I will ask case management to reach out to her and see if they can help with proper chanels to obtain all of her history and any additional data requested.    PAST MEDICAL HISTORY   Past Medical History:  Diagnosis Date   Allergy     Anxiety    Arthritis    Diabetes mellitus without complication (HCC)    Hypertension      SURGICAL HISTORY   Past Surgical History:  Procedure Laterality Date   CATARACT EXTRACTION W/PHACO Left 04/12/2016   Procedure: CATARACT EXTRACTION PHACO AND INTRAOCULAR LENS PLACEMENT (IOC);  Surgeon: Sallee Lange, MD;  Location: [facility] ORS;  Service: Ophthalmology;  Laterality: Left;  [place]  02:06 AP% 26.8 CDE 55.05 fluid pack lot # [id number]     FAMILY HISTORY   Family History  Problem Relation Age of Onset   Asthma Mother    Anxiety disorder Mother    Lung disease Mother    COPD Sister      SOCIAL HISTORY   Social History   Tobacco Use   Smoking status: Never Smoker   Smokeless tobacco: Never Used  Vaping Use   Vaping Use: Never used  Substance Use Topics   Alcohol use: Yes    Comment: occasional   Drug use: No     MEDICATIONS   Current Medication:  Current Facility-Administered Medications:    0.9 %  sodium chloride infusion, 250 mL, Intravenous, Continuous, Ronnald Ramp, RPH, Last Rate: 20 mL/hr at 05-15-20 1500, 250 mL at May 15, 2020 1500   acetaminophen (TYLENOL) tablet 650 mg, 650 mg, Oral, Q6H PRN **OR** acetaminophen (TYLENOL) suppository 650 mg, 650 mg, Rectal, Q6H PRN, Lorretta Harp, MD   chlorhexidine (PERIDEX) 0.12 % solution 15 mL, 15 mL, Mouth Rinse, BID, Strother Everitt, MD, 15 mL at 05/15/20 0923   Chlorhexidine Gluconate Cloth 2 % PADS 6 each, 6 each, Topical, Daily, Blakeney,  Neldon Newport, NP, 6 each at 05/15/20 7412   insulin aspart (novoLOG) injection 0-5 Units, 0-5 Units, Subcutaneous, QHS, Lorretta Harp, MD   insulin aspart (novoLOG) injection 0-9 Units, 0-9 Units, Subcutaneous, TID WC, Lorretta Harp, MD, 2 Units at 05/15/20 1115   MEDLINE mouth rinse, 15 mL, Mouth Rinse, q12n4p, Madline Oesterling, MD, 15 mL at 05/15/20 1115   norepinephrine (LEVOPHED) 4mg  in premix infusion, 2-10 mcg/min, Intravenous, Titrated, , RPH,  Paused at 05/14/20 1418   ondansetron (ZOFRAN) injection 4 mg, 4 mg, Intravenous, Q8H PRN, 05/16/20, MD   piperacillin-tazobactam (ZOSYN) IVPB 3.375 g, 3.375 g, Intravenous, Q8H, Nysia Dell, MD, Stopped at 05/15/20 [phone]    ALLERGIES   Tetracycline    REVIEW OF SYSTEMS    Unable to obtain due to severe encephalopathy  PHYSICAL EXAMINATION   Vital Signs: Temp:  [97.5 F (36.4 C)-98.8 F (37.1 C)] 97.6 F (36.4 C) (06/24 0800) Pulse Rate:  [64-75] 65 (06/24 1100) Resp:  [14-22] 14 (06/24 1100) BP: (89-118)/(43-62) 104/48 (06/24 1100) SpO2:  [98 %-100 %] 99 % (06/24 1100)  GENERAL:chronically ill appearing HEAD: Normocephalic, atraumatic.  EYES: Pupils equal, round, reactive to light.  No scleral icterus.  MOUTH: Moist mucosal membrane. NECK: Supple. No thyromegaly. No nodules. No JVD.  PULMONARY: decreased BS bilaterally  CARDIOVASCULAR: S1 and S2. Regular rate and rhythm. No murmurs, rubs, or gallops.  GASTROINTESTINAL: Soft, nontender, non-distended. No masses. Positive bowel sounds. No hepatosplenomegaly.  MUSCULOSKELETAL:upper and lowe extermity edema + NEUROLOGIC:GCS9 SKIN:intact,warm,dry   PERTINENT DATA     Infusions:  sodium chloride 250 mL (May 20, 2020 1500)   norepinephrine (LEVOPHED) Adult infusion Stopped (05/14/20 1418)   piperacillin-tazobactam (ZOSYN)  IV Stopped (05/15/20 05/17/20)   Scheduled Medications:  chlorhexidine  15 mL Mouth Rinse BID   Chlorhexidine Gluconate Cloth  6 each Topical Daily   insulin aspart  0-5 Units Subcutaneous QHS   insulin aspart  0-9 Units Subcutaneous TID WC   mouth rinse  15 mL Mouth Rinse q12n4p   PRN Medications: acetaminophen **OR** acetaminophen, ondansetron (ZOFRAN) IV Hemodynamic parameters:   Intake/Output: 06/23 0701 - 06/24 0700 In: 1367.3 [I.V.:1269.1; IV Piggyback:98.2] Out: 360 [Urine:360]  Ventilator  Settings:     LAB RESULTS:  Basic Metabolic Panel: Recent Labs  Lab  05/09/20 0432 05/09/20 0432 May 20, 2020 0940 05-20-20 1554 05/14/20 0234 05/15/20 0408  NA 136  --  138  --  139 141  K 4.2   < > 5.4*   < > 5.2* 4.7  CL 102  --  102  --  106 109  CO2 28  --  20*  --  21* 22  GLUCOSE 191*  --  201*  --  212* 156*  BUN 28*  --  49*  --  54* 64*  CREATININE 0.84  --  1.52*  --  1.60* 1.71*  CALCIUM 8.7*  --  8.7*  --  8.0* 7.6*  MG 2.1  --   --   --  2.1 2.2  PHOS  --   --   --   --  4.2 3.6   < > = values in this interval not displayed.   Liver Function Tests: Recent Labs  Lab 05/20/20 0940 05/14/20 0234  AST 17 26  ALT 13 13  ALKPHOS 62 46  BILITOT 1.5* 0.9  PROT 5.1* 4.9*  ALBUMIN 1.8* 1.9*   No results for input(s): LIPASE, AMYLASE in the last 168 hours. Recent Labs  Lab 20-May-2020 1101  AMMONIA <9*   CBC: Recent Labs  Lab 05/09/20 0432 2020-06-11 0940 05/14/20 0234 05/15/20 0408  WBC 9.7 6.7 12.8* 10.3  NEUTROABS 7.2 4.9 11.6* 9.1*  HGB 9.0* 9.4* 8.7* 8.5*  HCT 28.2* 29.5* 28.0* 26.6*  MCV 93.4 92.8 93.3 91.7  PLT 239 385 368 263   Cardiac Enzymes: No results for input(s): CKTOTAL, CKMB, CKMBINDEX, TROPONINI in the last 168 hours. BNP: Invalid input(s): POCBNP CBG: Recent Labs  Lab 05/14/20 1620 05/14/20 2151 05/15/20 0729 05/15/20 1112 05/15/20 1147  GLUCAP 123* 126* 143* 156* 148*       IMAGING RESULTS:  Imaging: DG Chest Port 1 View  Result Date: 05/14/2020 CLINICAL DATA:  86 year old female with malnutrition. Abnormal chest radiograph. EXAM: PORTABLE CHEST 1 VIEW COMPARISON:  Chest radiograph dated 06-11-2020 and CT dated 06-11-2020. FINDINGS: Small bilateral pleural effusions with bibasilar atelectasis or infiltrate, right greater left. No pneumothorax. Stable cardiomegaly. Atherosclerotic calcification of the aorta. No acute osseous pathology. IMPRESSION: Small bilateral pleural effusions with bibasilar atelectasis or infiltrate, right greater than left. Electronically Signed   By: Elgie Collard M.D.    On: 05/14/2020 18:35   ECHOCARDIOGRAM COMPLETE  Result Date: 05/14/2020    ECHOCARDIOGRAM REPORT   Patient Name:   Sierra Lloyd Septer Date of Exam: 05/14/2020 Medical Rec #:  [id number]        Height:       61.0 in Accession #:    [id number]       Weight:       141.3 lb Date of Birth:  31-Jul-1934         BSA:          1.630 m Patient Age:    86 years         BP:           89/55 mmHg Patient Gender: F                HR:           66 bpm. Exam Location:  [facility] Procedure: 2D Echo, Cardiac Doppler and Color Doppler Indications:     CHF- acute systolic 428.21  History:         Patient has no prior history of Echocardiogram examinations.                  Risk Factors:Hypertension and Diabetes.  Sonographer:     Cristela Blue RDCS (AE) Referring Phys:  [id number] Vida Rigger Diagnosing Phys: Alwyn Pea MD IMPRESSIONS  1. Left ventricular ejection fraction, by estimation, is 60 to 65%. The left ventricle has normal function. The left ventricle has no regional wall motion abnormalities. The left ventricular internal cavity size was moderately dilated. Left ventricular diastolic parameters are consistent with Grade II diastolic dysfunction (pseudonormalization).  2. Right ventricular systolic function is moderately reduced. The right ventricular size is moderately enlarged. There is moderately elevated pulmonary artery systolic pressure.  3. Left atrial size was moderately dilated.  4. Right atrial size was mild to moderately dilated.  5. The mitral valve is grossly normal. Mild to moderate mitral valve regurgitation.  6. The aortic valve is grossly normal. Aortic valve regurgitation is not visualized. FINDINGS  Left Ventricle: Left ventricular ejection fraction, by estimation, is 60 to 65%. The left ventricle has normal function. The left ventricle has no regional wall motion abnormalities. The left ventricular internal cavity size was moderately dilated. There is no left ventricular hypertrophy. Left ventricular diastolic  parameters are consistent with  Grade II diastolic dysfunction (pseudonormalization). Right Ventricle: The right ventricular size is moderately enlarged. No increase in right ventricular wall thickness. Right ventricular systolic function is moderately reduced. There is moderately elevated pulmonary artery systolic pressure. The tricuspid  regurgitant velocity is 3.26 m/s, and with an assumed right atrial pressure of 10 mmHg, the estimated right ventricular systolic pressure is 80.9 mmHg. Left Atrium: Left atrial size was moderately dilated. Right Atrium: Right atrial size was mild to moderately dilated. Pericardium: There is no evidence of pericardial effusion. Mitral Valve: The mitral valve is grossly normal. Mild to moderate mitral valve regurgitation. Tricuspid Valve: The tricuspid valve is normal in structure. Tricuspid valve regurgitation is mild. Aortic Valve: The aortic valve is grossly normal. Aortic valve regurgitation is not visualized. Aortic valve mean gradient measures 3.0 mmHg. Aortic valve peak gradient measures 4.8 mmHg. Aortic valve area, by VTI measures 2.14 cm. Pulmonic Valve: The pulmonic valve was normal in structure. Pulmonic valve regurgitation is not visualized. Aorta: The aortic root is normal in size and structure. IAS/Shunts: No atrial level shunt detected by color flow Doppler.  LEFT VENTRICLE PLAX 2D LVIDd:         5.10 cm  Diastology LVIDs:         3.40 cm  LV e' lateral:   8.27 cm/s LV PW:         0.77 cm  LV E/e' lateral: 11.2 LV IVS:        0.69 cm  LV e' medial:    6.85 cm/s LVOT diam:     2.00 cm  LV E/e' medial:  13.6 LV SV:         48 LV SV Index:   30 LVOT Area:     3.14 cm  RIGHT VENTRICLE RV Basal diam:  3.84 cm RV S prime:     11.70 cm/s TAPSE (M-mode): 4.1 cm LEFT ATRIUM             Index       RIGHT ATRIUM           Index LA diam:        3.60 cm 2.21 cm/m  RA Area:     13.70 cm LA Vol (A2C):   87.0 ml 53.39 ml/m RA Volume:   31.30 ml  19.21 ml/m LA Vol (A4C):   55.3  ml 33.94 ml/m LA Biplane Vol: 69.1 ml 42.40 ml/m  AORTIC VALVE AV Area (Vmax):    1.77 cm AV Area (Vmean):   2.01 cm AV Area (VTI):     2.14 cm AV Vmax:           110.00 cm/s AV Vmean:          75.450 cm/s AV VTI:            0.226 m AV Peak Grad:      4.8 mmHg AV Mean Grad:      3.0 mmHg LVOT Vmax:         62.00 cm/s LVOT Vmean:        48.200 cm/s LVOT VTI:          0.154 m LVOT/AV VTI ratio: 0.68  AORTA Ao Root diam: 2.70 cm MITRAL VALVE               TRICUSPID VALVE MV Area (PHT): 5.38 cm    TR Peak grad:   42.5 mmHg MV Decel Time: 141 msec    TR Vmax:  326.00 cm/s MV E velocity: 93.00 cm/s MV A velocity: 61.70 cm/s  SHUNTS MV E/A ratio:  1.51        Systemic VTI:  0.15 m                            Systemic Diam: 2.00 cm Alwyn Peawayne D Callwood MD Electronically signed by Alwyn Peawayne D Callwood MD Signature Date/Time: 05/14/2020/4:21:43 PM    Final    @PROBHOSP @ DG Chest Port 1 View  Result Date: 05/14/2020 CLINICAL DATA:  86 year old female with malnutrition. Abnormal chest radiograph. EXAM: PORTABLE CHEST 1 VIEW COMPARISON:  Chest radiograph dated 05/18/2020 and CT dated 04/23/2020. FINDINGS: Small bilateral pleural effusions with bibasilar atelectasis or infiltrate, right greater left. No pneumothorax. Stable cardiomegaly. Atherosclerotic calcification of the aorta. No acute osseous pathology. IMPRESSION: Small bilateral pleural effusions with bibasilar atelectasis or infiltrate, right greater than left. Electronically Signed   By: Elgie CollardArash  Radparvar M.D.   On: 05/14/2020 18:35   ECHOCARDIOGRAM COMPLETE  Result Date: 05/14/2020    ECHOCARDIOGRAM REPORT   Patient Name:   Gilford RileRICIA A Hassan Date of Exam: 05/14/2020 Medical Rec #:  [id number]        Height:       61.0 in Accession #:    [id number]       Weight:       141.3 lb Date of Birth:  03/17/1934         BSA:          1.630 m Patient Age:    86 years         BP:           89/55 mmHg Patient Gender: F                HR:           66 bpm. Exam Location:   [facility] Procedure: 2D Echo, Cardiac Doppler and Color Doppler Indications:     CHF- acute systolic 428.21  History:         Patient has no prior history of Echocardiogram examinations.                  Risk Factors:Hypertension and Diabetes.  Sonographer:     Cristela BlueJerry Hege RDCS (AE) Referring Phys:  [id number] Vida RiggerFUAD Symir Mah Diagnosing Phys: Alwyn Peawayne D Callwood MD IMPRESSIONS  1. Left ventricular ejection fraction, by estimation, is 60 to 65%. The left ventricle has normal function. The left ventricle has no regional wall motion abnormalities. The left ventricular internal cavity size was moderately dilated. Left ventricular diastolic parameters are consistent with Grade II diastolic dysfunction (pseudonormalization).  2. Right ventricular systolic function is moderately reduced. The right ventricular size is moderately enlarged. There is moderately elevated pulmonary artery systolic pressure.  3. Left atrial size was moderately dilated.  4. Right atrial size was mild to moderately dilated.  5. The mitral valve is grossly normal. Mild to moderate mitral valve regurgitation.  6. The aortic valve is grossly normal. Aortic valve regurgitation is not visualized. FINDINGS  Left Ventricle: Left ventricular ejection fraction, by estimation, is 60 to 65%. The left ventricle has normal function. The left ventricle has no regional wall motion abnormalities. The left ventricular internal cavity size was moderately dilated. There is no left ventricular hypertrophy. Left ventricular diastolic parameters are consistent with Grade II diastolic dysfunction (pseudonormalization). Right Ventricle: The right ventricular size is moderately enlarged. No increase in right ventricular wall thickness. Right  ventricular systolic function is moderately reduced. There is moderately elevated pulmonary artery systolic pressure. The tricuspid  regurgitant velocity is 3.26 m/s, and with an assumed right atrial pressure of 10 mmHg, the estimated right  ventricular systolic pressure is 52.5 mmHg. Left Atrium: Left atrial size was moderately dilated. Right Atrium: Right atrial size was mild to moderately dilated. Pericardium: There is no evidence of pericardial effusion. Mitral Valve: The mitral valve is grossly normal. Mild to moderate mitral valve regurgitation. Tricuspid Valve: The tricuspid valve is normal in structure. Tricuspid valve regurgitation is mild. Aortic Valve: The aortic valve is grossly normal. Aortic valve regurgitation is not visualized. Aortic valve mean gradient measures 3.0 mmHg. Aortic valve peak gradient measures 4.8 mmHg. Aortic valve area, by VTI measures 2.14 cm. Pulmonic Valve: The pulmonic valve was normal in structure. Pulmonic valve regurgitation is not visualized. Aorta: The aortic root is normal in size and structure. IAS/Shunts: No atrial level shunt detected by color flow Doppler.  LEFT VENTRICLE PLAX 2D LVIDd:         5.10 cm  Diastology LVIDs:         3.40 cm  LV e' lateral:   8.27 cm/s LV PW:         0.77 cm  LV E/e' lateral: 11.2 LV IVS:        0.69 cm  LV e' medial:    6.85 cm/s LVOT diam:     2.00 cm  LV E/e' medial:  13.6 LV SV:         48 LV SV Index:   30 LVOT Area:     3.14 cm  RIGHT VENTRICLE RV Basal diam:  3.84 cm RV S prime:     11.70 cm/s TAPSE (M-mode): 4.1 cm LEFT ATRIUM             Index       RIGHT ATRIUM           Index LA diam:        3.60 cm 2.21 cm/m  RA Area:     13.70 cm LA Vol (A2C):   87.0 ml 53.39 ml/m RA Volume:   31.30 ml  19.21 ml/m LA Vol (A4C):   55.3 ml 33.94 ml/m LA Biplane Vol: 69.1 ml 42.40 ml/m  AORTIC VALVE AV Area (Vmax):    1.77 cm AV Area (Vmean):   2.01 cm AV Area (VTI):     2.14 cm AV Vmax:           110.00 cm/s AV Vmean:          75.450 cm/s AV VTI:            0.226 m AV Peak Grad:      4.8 mmHg AV Mean Grad:      3.0 mmHg LVOT Vmax:         62.00 cm/s LVOT Vmean:        48.200 cm/s LVOT VTI:          0.154 m LVOT/AV VTI ratio: 0.68  AORTA Ao Root diam: 2.70 cm MITRAL VALVE                TRICUSPID VALVE MV Area (PHT): 5.38 cm    TR Peak grad:   42.5 mmHg MV Decel Time: 141 msec    TR Vmax:        326.00 cm/s MV E velocity: 93.00 cm/s MV A velocity: 61.70 cm/s  SHUNTS MV E/A ratio:  1.51  Systemic VTI:  0.15 m                            Systemic Diam: 2.00 cm Alwyn Pea MD Electronically signed by Alwyn Pea MD Signature Date/Time: 05/14/2020/4:21:43 PM    Final      ASSESSMENT AND PLAN    -Multidisciplinary rounds held today  Septic shock - present on admission  -due to intraabdominal infection with diverticulitis and gram negative rod bacteremia -lactate >3 and procal >14 -continue current abx-Zosyn - appreciate pharmacy collaboration -use vasopressors to keep MAP>65-currently low dose levophed   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 with background of diabetic nephropathy -follow chem 7 -follow UO -continue Foley Catheter-assess need daily  DVT of RLE  S/p IVC filter   ID -continue IV abx as prescibed -follow up cultures  GI/Nutrition GI PROPHYLAXIS as indicated DIET-->TF's as tolerated Constipation protocol as indicated  ENDO - ICU hypoglycemic\Hyperglycemia protocol -check FSBS per protocol   ELECTROLYTES -follow labs as needed -replace as needed -pharmacy consultation   DVT/GI PRX ordered -SCDs  TRANSFUSIONS AS NEEDED MONITOR FSBS ASSESS the need for LABS as needed   Critical care provider statement:    Critical care time (minutes):  109   Critical care time was exclusive of:  Separately billable procedures and treating other patients   Critical care was necessary to treat or prevent imminent or life-threatening deterioration of the following conditions:  acute diverticulitis, septic shock, hx of CVA,   Hx of RLE DVT, acute renal failure on chronic renal failure,    Critical care was time spent personally by me on the following activities:  Development of treatment plan with patient or surrogate, discussions with consultants, evaluation of patient's response to treatment, examination of patient, obtaining history from patient or surrogate, ordering and performing treatments and interventions, ordering and review of laboratory studies and re-evaluation of patient's condition.  I assumed direction of critical care for this patient from another provider in my specialty: no    This document was prepared using Dragon voice recognition software and may include unintentional dictation errors.    Vida Rigger, M.D.  Division of Pulmonary & Critical Care Medicine  [facility]

## 2020-05-15 NOTE — Consult Note (Signed)
Consultation Note Date: 05/15/2020   Patient Name: Sierra Lloyd  DOB: [birth year]  MRN: [id number]  Age / Sex: 86 y.o., female  PCP: Mar Daring, PA-C Referring Physician: Ottie Glazier, MD  Reason for Consultation: Establishing goals of care  HPI/Patient Profile: 86 y.o. female  with past medical history of HTN, DM, DVT s/p IVC filter, SDH, CKD 3, depression, and anxiety admitted on 05/11/2020 with AMS. Patient recently hospitalized 5/30-6/18 d/t SDH, R leg DVT, and IVC filter placement. Patient found to have lactic acidosis and worsening renal function on admission. CT abd revealed diverticulitis and microperforation. Patient being treated for septic shock d/t intraabdominal infection. Deemed poor surgical candidate. Also with severe malnutrition - albumin 1.8. Urine output worsening. PMT consulted for Zena.   Clinical Assessment and Goals of Care: I have reviewed medical records including EPIC notes, labs and imaging, received report from RN, assessed the patient and then spoke with niece, [name],  to discuss diagnosis prognosis, GOC, EOL wishes, disposition and options.  I introduced Palliative Medicine as specialized medical care for people living with serious illness. It focuses on providing relief from the symptoms and stress of a serious illness. The goal is to improve quality of life for both the patient and the family.  [name] tells me prior to patient's hospitalization 5/30, she was living alone. She required help with housework and assistance with bathing. Her mind was sharp. She did struggle with lots of anxiety and required medication for management. [name] speaks of patient drastic decline since last hospitalization. We speak about patient's rehab stay at [facility] tells me she thinks patient mostly slept as she was in pain every time she work up.    We discussed patient's current illness and what it means in the larger context  of patient's on-going co-morbidities.  Natural disease trajectory and expectations at EOL were discussed. Discussed concern about patient's mental status/unresponsiveness, poor nutrition/albumin 1.8, and her worsening kidney function.   I attempted to elicit values and goals of care important to the patient.  [name] shares patient had only expressed to her that she did not want to go to the hospital as she feared she would never come home and did not want to go to a nursing home. She is unsure about patient's wishes about resuscitative interventions.   The difference between aggressive medical intervention and comfort care was considered in light of the patient's goals of care. Described continued aggressive care path vs comfort care path. Suggested continuing current measures for now with limitation of DNR and if patient does not improve or worsens in coming days transition to comfort focused care. [name] requests time to review patient's medical records prior to making decisions.  She asks about disposition if [name] did not improve - we discuss it would depend on her condition but possible options are comfort care with hospital death, hospice facility, or hospice at home. Explained to [place] that hospice at home is less likely unless patient makes drastic improvement or [name] is able to arrange 24/7 care at home.   [name] plans to review medical records tonight and we plan to follow up tomorrow regarding decisions about code status and how to proceed with care.   Questions and concerns were addressed. The family was encouraged to call with questions or concerns.   Primary Decision Maker NEXT OF KIN - niece [name]    SUMMARY OF RECOMMENDATIONS   - continue current measures - discussion with niece - discussed code  status change to DNR but niece requests time to review medical records first - we plan to follow up tomorrow - hospice care/comfort care was discussed  Code Status/Advance Care  Planning:  Full code  Discharge Planning: To Be Determined      Primary Diagnoses: Present on Admission: . Acute metabolic encephalopathy . Acute diverticulitis . Severe sepsis with septic shock (HCC) . Anxiety, generalized . Essential hypertension . Acute renal failure superimposed on stage 3 chronic kidney disease (HCC) . Hyperkalemia . Type II diabetes mellitus with renal manifestations (HCC) . Depression with anxiety . SDH (subdural hematoma) (HCC) . Diverticulitis . Elevated troponin   I have reviewed the medical record, interviewed the patient and family, and examined the patient. The following aspects are pertinent.  Past Medical History:  Diagnosis Date  . Allergy   . Anxiety   . Arthritis   . Diabetes mellitus without complication (HCC)   . Hypertension    Social History   Socioeconomic History  . Marital status: Single    Spouse name: Not on file  . Number of children: Not on file  . Years of education: Not on file  . Highest education level: Not on file  Occupational History  . Not on file  Tobacco Use  . Smoking status: Never Smoker  . Smokeless tobacco: Never Used  Vaping Use  . Vaping Use: Never used  Substance and Sexual Activity  . Alcohol use: Yes    Comment: occasional  . Drug use: No  . Sexual activity: Not Currently  Other Topics Concern  . Not on file  Social History Narrative  . Not on file   Social Determinants of Health   Financial Resource Strain:   . Difficulty of Paying Living Expenses:   Food Insecurity:   . Worried About Programme researcher, broadcasting/film/video in the Last Year:   . Barista in the Last Year:   Transportation Needs:   . Freight forwarder (Medical):   Marland Kitchen Lack of Transportation (Non-Medical):   Physical Activity:   . Days of Exercise per Week:   . Minutes of Exercise per Session:   Stress:   . Feeling of Stress :   Social Connections:   . Frequency of Communication with Friends and Family:   . Frequency of  Social Gatherings with Friends and Family:   . Attends Religious Services:   . Active Member of Clubs or Organizations:   . Attends Banker Meetings:   Marland Kitchen Marital Status:    Family History  Problem Relation Age of Onset  . Asthma Mother   . Anxiety disorder Mother   . Lung disease Mother   . COPD Sister    Scheduled Meds: . chlorhexidine  15 mL Mouth Rinse BID  . Chlorhexidine Gluconate Cloth  6 each Topical Daily  . insulin aspart  0-5 Units Subcutaneous QHS  . insulin aspart  0-9 Units Subcutaneous TID WC  . mouth rinse  15 mL Mouth Rinse q12n4p   Continuous Infusions: . sodium chloride 250 mL (04/29/2020 1500)  . norepinephrine (LEVOPHED) Adult infusion Stopped (05/14/20 1418)  . piperacillin-tazobactam (ZOSYN)  IV 12.5 mL/hr at 05/15/20 1400   PRN Meds:.acetaminophen **OR** acetaminophen, ondansetron (ZOFRAN) IV Allergies  Allergen Reactions  . Tetracycline     Roof of mouth broke out.   Review of Systems  Unable to perform ROS: Mental status change    Physical Exam Constitutional:      Comments: Does not respond to  verbal or physical stimulation  Pulmonary:     Effort: Pulmonary effort is normal.  Musculoskeletal:     Comments: All ext edematous     Vital Signs: BP (!) 104/31   Pulse 66   Temp 97.8 F (36.6 C) (Axillary)   Resp 15   Ht 5\' 1"  (1.549 m)   Wt 64.1 kg   SpO2 99%   BMI 26.70 kg/m  Pain Scale: CPOT       SpO2: SpO2: 99 % O2 Device:SpO2: 99 % O2 Flow Rate: .O2 Flow Rate (L/min): 2 L/min  IO: Intake/output summary:   Intake/Output Summary (Last 24 hours) at 05/15/2020 1515 Last data filed at 05/15/2020 1400 Gross per 24 hour  Intake 2109.8 ml  Output 305 ml  Net 1804.8 ml    LBM: Last BM Date: 05/09/20 Baseline Weight: Weight: 50.6 kg Most recent weight: Weight: 64.1 kg     Palliative Assessment/Data: PPS 10%    Time Total: 55 minutes Greater than 50%  of this time was spent counseling and coordinating care  related to the above assessment and plan.  05/11/20, DNP, AGNP-C Palliative Medicine Team [phone] Pager: [phone]

## 2020-05-15 NOTE — Progress Notes (Signed)
CC: Diverticulitis Subjective: Patient is nonverbal and not responsive however she does not grimaces when I perform an abdominal exam  Objective: Vital signs in last 24 hours: Temp:  [97.5 F (36.4 C)-98.8 F (37.1 C)] 97.6 F (36.4 C) (06/24 0800) Pulse Rate:  [64-75] 65 (06/24 1000) Resp:  [15-22] 15 (06/24 1000) BP: (89-118)/(43-62) 97/51 (06/24 1000) SpO2:  [98 %-100 %] 100 % (06/24 1000) Last BM Date: 05/09/20  Intake/Output from previous day: 06/23 0701 - 06/24 0700 In: 1367.3 [I.V.:1269.1; IV Piggyback:98.2] Out: 360 [Urine:360] Intake/Output this shift: Total I/O In: 1043.3 [IV Piggyback:1043.3] Out: 30 [Urine:30]  Physical exam:  She seems comfortable and in no acute distress.  She is somnolent and does not follow commands. Abdomen: Soft but distended.  No obvious peritonitis however due to mentation abdominal exam is limited.  Lab Results: CBC  Recent Labs    05/14/20 0234 05/15/20 0408  WBC 12.8* 10.3  HGB 8.7* 8.5*  HCT 28.0* 26.6*  PLT 368 263   BMET Recent Labs    05/14/20 0234 05/15/20 0408  NA 139 141  K 5.2* 4.7  CL 106 109  CO2 21* 22  GLUCOSE 212* 156*  BUN 54* 64*  CREATININE 1.60* 1.71*  CALCIUM 8.0* 7.6*   PT/INR Recent Labs    05-26-20 0940  LABPROT 13.3  INR 1.1   ABG Recent Labs    2020-05-26 1101  HCO3 20.0    Studies/Results: DG Chest Port 1 View  Result Date: 05/14/2020 CLINICAL DATA:  86 year old female with malnutrition. Abnormal chest radiograph. EXAM: PORTABLE CHEST 1 VIEW COMPARISON:  Chest radiograph dated 05/26/20 and CT dated 26-May-2020. FINDINGS: Small bilateral pleural effusions with bibasilar atelectasis or infiltrate, right greater left. No pneumothorax. Stable cardiomegaly. Atherosclerotic calcification of the aorta. No acute osseous pathology. IMPRESSION: Small bilateral pleural effusions with bibasilar atelectasis or infiltrate, right greater than left. Electronically Signed   By: Anner Crete M.D.    On: 05/14/2020 18:35   ECHOCARDIOGRAM COMPLETE  Result Date: 05/14/2020    ECHOCARDIOGRAM REPORT   Patient Name:   Sierra Lloyd Bohlen Date of Exam: 05/14/2020 Medical Rec #:  [id number]        Height:       61.0 in Accession #:    [id number]       Weight:       141.3 lb Date of Birth:  [birth year]         BSA:          1.630 m Patient Age:    86 years         BP:           89/55 mmHg Patient Gender: F                HR:           66 bpm. Exam Location:  [facility] Procedure: 2D Echo, Cardiac Doppler and Color Doppler Indications:     CHF- acute systolic 176.16  History:         Patient has no prior history of Echocardiogram examinations.                  Risk Factors:Hypertension and Diabetes.  Sonographer:     Sherrie Sport RDCS (AE) Referring Phys:  [id number] Ottie Glazier Diagnosing Phys: Yolonda Kida MD IMPRESSIONS  1. Left ventricular ejection fraction, by estimation, is 60 to 65%. The left ventricle has normal function. The left ventricle has no regional wall motion abnormalities. The  left ventricular internal cavity size was moderately dilated. Left ventricular diastolic parameters are consistent with Grade II diastolic dysfunction (pseudonormalization).  2. Right ventricular systolic function is moderately reduced. The right ventricular size is moderately enlarged. There is moderately elevated pulmonary artery systolic pressure.  3. Left atrial size was moderately dilated.  4. Right atrial size was mild to moderately dilated.  5. The mitral valve is grossly normal. Mild to moderate mitral valve regurgitation.  6. The aortic valve is grossly normal. Aortic valve regurgitation is not visualized. FINDINGS  Left Ventricle: Left ventricular ejection fraction, by estimation, is 60 to 65%. The left ventricle has normal function. The left ventricle has no regional wall motion abnormalities. The left ventricular internal cavity size was moderately dilated. There is no left ventricular hypertrophy. Left ventricular  diastolic parameters are consistent with Grade II diastolic dysfunction (pseudonormalization). Right Ventricle: The right ventricular size is moderately enlarged. No increase in right ventricular wall thickness. Right ventricular systolic function is moderately reduced. There is moderately elevated pulmonary artery systolic pressure. The tricuspid  regurgitant velocity is 3.26 m/s, and with an assumed right atrial pressure of 10 mmHg, the estimated right ventricular systolic pressure is 52.5 mmHg. Left Atrium: Left atrial size was moderately dilated. Right Atrium: Right atrial size was mild to moderately dilated. Pericardium: There is no evidence of pericardial effusion. Mitral Valve: The mitral valve is grossly normal. Mild to moderate mitral valve regurgitation. Tricuspid Valve: The tricuspid valve is normal in structure. Tricuspid valve regurgitation is mild. Aortic Valve: The aortic valve is grossly normal. Aortic valve regurgitation is not visualized. Aortic valve mean gradient measures 3.0 mmHg. Aortic valve peak gradient measures 4.8 mmHg. Aortic valve area, by VTI measures 2.14 cm. Pulmonic Valve: The pulmonic valve was normal in structure. Pulmonic valve regurgitation is not visualized. Aorta: The aortic root is normal in size and structure. IAS/Shunts: No atrial level shunt detected by color flow Doppler.  LEFT VENTRICLE PLAX 2D LVIDd:         5.10 cm  Diastology LVIDs:         3.40 cm  LV e' lateral:   8.27 cm/s LV PW:         0.77 cm  LV E/e' lateral: 11.2 LV IVS:        0.69 cm  LV e' medial:    6.85 cm/s LVOT diam:     2.00 cm  LV E/e' medial:  13.6 LV SV:         48 LV SV Index:   30 LVOT Area:     3.14 cm  RIGHT VENTRICLE RV Basal diam:  3.84 cm RV S prime:     11.70 cm/s TAPSE (M-mode): 4.1 cm LEFT ATRIUM             Index       RIGHT ATRIUM           Index LA diam:        3.60 cm 2.21 cm/m  RA Area:     13.70 cm LA Vol (A2C):   87.0 ml 53.39 ml/m RA Volume:   31.30 ml  19.21 ml/m LA Vol  (A4C):   55.3 ml 33.94 ml/m LA Biplane Vol: 69.1 ml 42.40 ml/m  AORTIC VALVE AV Area (Vmax):    1.77 cm AV Area (Vmean):   2.01 cm AV Area (VTI):     2.14 cm AV Vmax:           110.00 cm/s AV Vmean:  75.450 cm/s AV VTI:            0.226 m AV Peak Grad:      4.8 mmHg AV Mean Grad:      3.0 mmHg LVOT Vmax:         62.00 cm/s LVOT Vmean:        48.200 cm/s LVOT VTI:          0.154 m LVOT/AV VTI ratio: 0.68  AORTA Ao Root diam: 2.70 cm MITRAL VALVE               TRICUSPID VALVE MV Area (PHT): 5.38 cm    TR Peak grad:   42.5 mmHg MV Decel Time: 141 msec    TR Vmax:        326.00 cm/s MV E velocity: 93.00 cm/s MV A velocity: 61.70 cm/s  SHUNTS MV E/A ratio:  1.51        Systemic VTI:  0.15 m                            Systemic Diam: 2.00 cm Alwyn Pea MD Electronically signed by Alwyn Pea MD Signature Date/Time: 05/14/2020/4:21:43 PM    Final     Anti-infectives: Anti-infectives (From admission, onward)   Start     Dose/Rate Route Frequency Ordered Stop   05/14/20 1000  piperacillin-tazobactam (ZOSYN) IVPB 3.375 g  Status:  Discontinued        3.375 g 12.5 mL/hr over 240 Minutes Intravenous Every 12 hours 2020-05-28 1230 May 28, 2020 1235   05/14/20 1000  piperacillin-tazobactam (ZOSYN) IVPB 3.375 g     Discontinue     3.375 g 12.5 mL/hr over 240 Minutes Intravenous Every 8 hours 05/14/20 0848     05-28-20 2200  piperacillin-tazobactam (ZOSYN) IVPB 3.375 g  Status:  Discontinued        3.375 g 12.5 mL/hr over 240 Minutes Intravenous Every 12 hours May 28, 2020 1235 05/14/20 0848   May 28, 2020 1600  metroNIDAZOLE (FLAGYL) IVPB 500 mg  Status:  Discontinued        500 mg 100 mL/hr over 60 Minutes Intravenous Every 12 hours May 28, 2020 1545 05-28-2020 1555   2020-05-28 0945  ceFEPIme (MAXIPIME) 2 g in sodium chloride 0.9 % 100 mL IVPB        2 g 200 mL/hr over 30 Minutes Intravenous  Once 05-28-20 0943 05-28-20 1100   2020-05-28 0945  metroNIDAZOLE (FLAGYL) IVPB 500 mg        500 mg 100 mL/hr  over 60 Minutes Intravenous  Once 05-28-2020 0943 2020-05-28 1223   05/28/20 0945  vancomycin (VANCOCIN) IVPB 1000 mg/200 mL premix        1,000 mg 200 mL/hr over 60 Minutes Intravenous  Once 2020-05-28 8938 2020-05-28 1228      Assessment/Plan:  86 year old female very debilitated, malnourished recent intracranial bleed, DVT, with decubitus ulcers and altered mental status.  Currently admitted for altered mental status and diverticulitis with microperforation. She is very debilitated and not responsive.  I personally do not think that she will survive major surgical intervention.  The I do not still think that she needs one. I would encourage continue IV antibiotics and discussion with the family regarding her situation.  Her prognosis is very poor due to recent intracranial bleed, dementia and multiple comorbidities. D/W Dr. [name] in detail and we are in agreement with current plan This note that I spent about 35 minutes in this encounter with  greater than 50% spent in coordination and counseling of her care Sterling Big, MD, [facility]  05/15/2020

## 2020-05-15 NOTE — Progress Notes (Signed)
Informed pts niece via telephone Edwinna Areola Dr. [name] will speak with her regarding pts plan of care and prognosis when she arrives at the hospital today.    Sonda Rumble, AGNP  Pulmonary/Critical Care Pager [phone] (please enter 7 digits) PCCM Consult Pager [phone] (please enter 7 digits)

## 2020-05-15 NOTE — Plan of Care (Signed)

## 2020-05-16 ENCOUNTER — Inpatient Hospital Stay: Payer: Medicare Other

## 2020-05-16 DIAGNOSIS — Z66 Do not resuscitate: Secondary | ICD-10-CM

## 2020-05-16 DIAGNOSIS — A419 Sepsis, unspecified organism: Secondary | ICD-10-CM

## 2020-05-16 LAB — COMPREHENSIVE METABOLIC PANEL
ALT: 18 U/L (ref 0–44)
AST: 47 U/L — ABNORMAL HIGH (ref 15–41)
Albumin: 1.6 g/dL — ABNORMAL LOW (ref 3.5–5.0)
Alkaline Phosphatase: 61 U/L (ref 38–126)
Anion gap: 13 (ref 5–15)
BUN: 69 mg/dL — ABNORMAL HIGH (ref 8–23)
CO2: 18 mmol/L — ABNORMAL LOW (ref 22–32)
Calcium: 7.9 mg/dL — ABNORMAL LOW (ref 8.9–10.3)
Chloride: 110 mmol/L (ref 98–111)
Creatinine, Ser: 1.69 mg/dL — ABNORMAL HIGH (ref 0.44–1.00)
GFR calc Af Amer: 32 mL/min — ABNORMAL LOW (ref >60)
GFR calc non Af Amer: 27 mL/min — ABNORMAL LOW (ref >60)
Glucose, Bld: 153 mg/dL — ABNORMAL HIGH (ref 70–99)
Potassium: 4.3 mmol/L (ref 3.5–5.1)
Sodium: 141 mmol/L (ref 135–145)
Total Bilirubin: 1.1 mg/dL (ref 0.3–1.2)
Total Protein: 4.4 g/dL — ABNORMAL LOW (ref 6.5–8.1)

## 2020-05-16 LAB — GLUCOSE, CAPILLARY
Glucose-Capillary: 160 mg/dL — ABNORMAL HIGH (ref 70–99)
Glucose-Capillary: 143 mg/dL — ABNORMAL HIGH (ref 70–99)
Glucose-Capillary: 161 mg/dL — ABNORMAL HIGH (ref 70–99)
Glucose-Capillary: 169 mg/dL — ABNORMAL HIGH (ref 70–99)

## 2020-05-16 LAB — BLOOD GAS, ARTERIAL
Acid-base deficit: 5.1 mmol/L — ABNORMAL HIGH (ref 0.0–2.0)
Bicarbonate: 18.3 mmol/L — ABNORMAL LOW (ref 20.0–28.0)
FIO2: 0.21
O2 Saturation: 97.2 %
Patient temperature: 37
pCO2 arterial: 27 mmHg — ABNORMAL LOW (ref 32.0–48.0)
pH, Arterial: 7.44 (ref 7.350–7.450)
pO2, Arterial: 89 mmHg (ref 83.0–108.0)

## 2020-05-16 LAB — LACTIC ACID, PLASMA
Lactic Acid, Venous: 1.4 mmol/L (ref 0.5–1.9)
Lactic Acid, Venous: 1.7 mmol/L (ref 0.5–1.9)

## 2020-05-16 LAB — PROCALCITONIN: Procalcitonin: 14.28 ng/mL

## 2020-05-16 MED ORDER — PIPERACILLIN-TAZOBACTAM IN DEX 2-0.25 GM/50ML IV SOLN
2.2500 g | Freq: Four times a day (QID) | INTRAVENOUS | Status: DC
  Administered 2020-05-16 – 2020-05-18 (×9): 2.25 g via INTRAVENOUS
  Filled 2020-05-16 (×13): qty 50

## 2020-05-16 NOTE — Progress Notes (Signed)
The Clinical status was relayed to family in detail. Andreas Ohm niece   Updated and notified of patients medical condition.  Patient remains unresponsive and will not open eyes to command.   Explained to family course of therapy and the modalities   At this time, I have explained that prognosis is poor, she is not a candidate for surgery due to multiple issues, The Niece asked about using laxatives and I have explained with acute abdominal issues that patient has, using such methods may worsen condition and may hasten demise.  I have advised that she have a discussion with surgeon.     Patient with Progressive organ failure with very low chance of meaningful recovery despite all aggressive and optimal medical therapy. Surgery team to follow  Patient is in the slow dying Process associated with suffering. Palliative care also had discussion with family.  Carvel Getting the [place] was upset that patient was being transferred to medical floor.   I have explained that patient is sick but does NOT meet criteria for ICU needs(not on oxygen, not working to breathe, not on vasopressors and not on ventilator) Patient can still have full scope of therapy despite being DNR/DNI and can have blood draws and patient needs addressed, the niece then states that patient will NOT get the same care as in ICU.   Therefore, I have cancelled transfer to gen med floor to honor and respect the wishes of the family member.    Family understands the situation.  They have consented and agreed to DNR/DNI and would like to proceed with Comfort care measures if patient does not improve with time as the Niece has stated with palliative care team.  Family are satisfied with Plan of action and management. All questions answered     Lucie Leather, M.D.  [facility]  Medical Director [facility] Medical Director [facility] Cardio-Pulmonary Department

## 2020-05-16 NOTE — Consult Note (Signed)
Pharmacy Antibiotic Note  Sierra Lloyd is a 86 y.o. female admitted on 05/20/2020 with diverticulitis with microperforation.  Pharmacy has been consulted for pip/tazo dosing.  Today, 05/16/2020  Day 3 antibiotics  6/22 Bcx CoNS 1/4 bottles.  GNR 2/2 sets but anaerobic bottles only.    SCr worsening with low UOP  Afebrile  WBC WNL  Plan:  Based on worsening renal function,  Reduce piperacillin/tazobactam to 2.25gm IV q6h   May need to reduce to to q8h if becomes anuric  Suspect anaerobe will grow from blood culture. F/u ability to tailor antibiotics with final results  Height: 5\' 1"  (154.9 cm) Weight: 64.1 kg (141 lb 5 oz) IBW/kg (Calculated) : 47.8  Temp (24hrs), Avg:98.2 F (36.8 C), Min:97.8 F (36.6 C), Max:98.6 F (37 C)  Recent Labs  Lab 05/21/2020 0940 05/14/2020 1101 05/05/2020 2234 05/14/20 0010 05/14/20 0234 05/14/20 0618 05/15/20 0408 05/16/20 0610  WBC 6.7  --   --   --  12.8*  --  10.3  --   CREATININE 1.52*  --   --   --  1.60*  --  1.71* 1.69*  LATICACIDVEN 2.8*   < > 3.1* 3.1* 3.3* 2.8* 2.2*  --    < > = values in this interval not displayed.    Estimated Creatinine Clearance: 21.2 mL/min (A) (by C-G formula based on SCr of 1.69 mg/dL (H)).    Allergies  Allergen Reactions  . Tetracycline     Roof of mouth broke out.    Antimicrobials this admission: 6/22 cefepime, vancomycin, and flagyl x 1 6/22 pip/tazo >>   Dose adjustments this admission: None  Microbiology results: 6/22 BCx: GNR anaerobic bottles only  6/22 UCx: pending   Thank you for allowing pharmacy to be a part of this patient's care.  7/22, PharmD, BCPS.   Work Cell: [phone] 05/16/2020 10:49 AM

## 2020-05-16 NOTE — Progress Notes (Signed)
I had a lengthy conversation with Niece Sierra Lloyd. She is in agreement that they are not interested in surgery but they are open to consider NGT and meds. Potentially they are also open to do enteral feeds. We will be available if any surgical issues arise. D/W Dr. Belia Heman

## 2020-05-16 NOTE — Progress Notes (Signed)
Daily Progress Note   Patient Name: Sierra Lloyd       Date: 05/16/2020 DOB: 14-Aug-1934  Age: 86 y.o. MRN#: [id number] Attending Physician: Erin Fulling, MD Primary Care Physician: Reine Just Admit Date: 05/16/2020  Reason for Consultation/Follow-up: Establishing goals of care  Subjective: Patient unresponsive to voice and touch. RN reports same. Poor urine output. Family not currently at bedside.  Length of Stay: 3  Current Medications: Scheduled Meds:  . chlorhexidine  15 mL Mouth Rinse BID  . Chlorhexidine Gluconate Cloth  6 each Topical Daily  . insulin aspart  0-5 Units Subcutaneous QHS  . insulin aspart  0-9 Units Subcutaneous TID WC  . mouth rinse  15 mL Mouth Rinse q12n4p    Continuous Infusions: . sodium chloride 250 mL (05-16-2020 1500)  . albumin human 12.5 g (05/16/20 1024)  . piperacillin-tazobactam (ZOSYN)  IV      PRN Meds: acetaminophen **OR** acetaminophen, ondansetron (ZOFRAN) IV  Physical Exam Constitutional:      General: She is not in acute distress.    Comments: unresponsive  Cardiovascular:     Rate and Rhythm: Normal rate.  Pulmonary:     Effort: Pulmonary effort is normal. No respiratory distress.  Musculoskeletal:     Comments: Edematous extremities  Skin:    General: Skin is warm and dry.             Vital Signs: BP (!) 130/51   Pulse 73   Temp 98.3 F (36.8 C) (Axillary)   Resp (!) 29   Ht 5\' 1"  (1.549 m)   Wt 64.1 kg   SpO2 97%   BMI 26.70 kg/m  SpO2: SpO2: 97 % O2 Device: O2 Device: Room Air O2 Flow Rate: O2 Flow Rate (L/min): 2 L/min  Intake/output summary:   Intake/Output Summary (Last 24 hours) at 05/16/2020 1339 Last data filed at 05/16/2020 0800 Gross per 24 hour  Intake 326.26 ml  Output 235 ml  Net 91.26 ml    LBM: Last BM Date: 05/09/20 Baseline Weight: Weight: 50.6 kg Most recent weight: Weight: 64.1 kg       Palliative Assessment/Data: PPS 10%    Flowsheet Rows     Most Recent Value  Intake Tab  Referral Department Critical care  Palliative Care Primary Diagnosis Sepsis/Infectious Disease  Date Notified 05/14/20  Palliative Care Type Return patient Palliative Care  Reason for referral Clarify Goals of Care  Date of Admission 05-16-20  Date first seen by Palliative Care 05/15/20  # of days Palliative referral response time 1 Day(s)  # of days IP prior to Palliative referral 1  Clinical Assessment  Palliative Performance Scale Score 10%  Psychosocial & Spiritual Assessment  Palliative Care Outcomes  Patient/Family meeting held? Yes  Who was at the meeting? niece  Palliative Care Outcomes Clarified goals of care, Counseled regarding hospice, ACP counseling assistance      Patient Active Problem List   Diagnosis Date Noted  . Sepsis (HCC)   . Severe malnutrition (HCC)   . Acute metabolic encephalopathy 2020-05-16  . Acute diverticulitis 05-16-20  . Severe sepsis with septic shock (HCC) 05-16-20  . Acute renal failure superimposed on stage 3 chronic  kidney disease (HCC) 05/18/2020  . Hyperkalemia 05/18/2020  . Type II diabetes mellitus with renal manifestations (HCC) 05/09/2020  . Depression with anxiety 05/06/2020  . SDH (subdural hematoma) (HCC) 05/07/2020  . Diverticulitis 05/01/2020  . Elevated troponin 05/09/2020  . Perforation bowel (HCC)   . Bilateral lower extremity pain   . Goals of care, counseling/discussion   . Palliative care by specialist   . Acute deep vein thrombosis (DVT) of proximal vein of right lower extremity (HCC) 05/04/2020  . Subdural hemorrhage (HCC) 04/21/2020  . Fall at home, initial encounter 04/21/2020  . Fall 04/21/2020  . Adhesive capsulitis of right shoulder 12/27/2016  . Burn 12/27/2016  . Paresthesia 07/15/2015  . Allergic  rhinitis 05/03/2015  . Arm pain 05/03/2015  . Arthritis 05/03/2015  . Diabetes (HCC) 05/03/2015  . Anxiety, generalized 05/03/2015  . Essential hypertension 05/03/2015  . LBP (low back pain) 05/03/2015  . Panic attack 05/03/2015    Palliative Care Assessment & Plan   HPI: 86 y.o. female  with past medical history of HTN, DM, DVT s/p IVC filter, SDH, CKD 3, depression, and anxiety admitted on 05/05/2020 with AMS. Patient recently hospitalized 5/30-6/18 d/t SDH, R leg DVT, and IVC filter placement. Patient found to have lactic acidosis and worsening renal function on admission. CT abd revealed diverticulitis and microperforation. Patient being treated for septic shock d/t intraabdominal infection. Deemed poor surgical candidate. Also with severe malnutrition - albumin 1.8. Urine output worsening. PMT consulted for GOC.   Assessment: Follow up call with patient's niece [name].   We reviewed clinical condition, she is aware of patient's continued unresponsiveness and worsening kidney function. She is aware of concerns of poor prognosis.   We discussed code status. Encouraged [name] to consider DNR/DNI status understanding evidenced based poor outcomes in similar hospitalized patients, as the cause of the arrest is likely associated with chronic/terminal disease rather than a reversible acute cardio-pulmonary event.   Camelia Eng is concerned about other interventions not continuing if patient is made DNR - we discuss that we could still provide full scope treatment with a limit of DNR/DNI - specifically discussed continuing IV fluids, antibiotics, lab work and imaging as needed, and oxygen as needed.   [name] agrees that DNR is appropriate as she does not want to prolong unnecessary suffering without hope of return to good quality of life. We discussed plan of continuing full scope interventions with limit of DNR. Will follow up on Monday - if patient continues to decline despite full scope treatment will  consider transition to comfort care only. Camelia Eng does emphasize a desire to minimize suffering and promote comfort.   Discussed plan with ICU RN and Dr. Belia Heman.  Recommendations/Plan:  Code status changed to DNR  Continue current measures - full scope treatment   Will follow up Monday - if patient continues to decline will further discuss transition to comfort care with family  Family aware of severity of illness and patient could decline despite full scope interventions  Code Status:  DNR  Discharge Planning:  To Be Determined  Care plan was discussed with ICU RN, Dr. Belia Heman, niece Andreas Ohm  Thank you for allowing the Palliative Medicine Team to assist in the care of this patient.   Total Time 35 minutes Prolonged Time Billed  no       Greater than 50%  of this time was spent counseling and coordinating care related to the above assessment and plan.  Gerlean Ren, DNP, AGNP-C Palliative Medicine Team  Team Phone # [phone]  Pager [phone]

## 2020-05-16 NOTE — Progress Notes (Signed)
[facility] SURGICAL PROGRESS NOTE (cpt [phone])  Hospital Day(s): 3.   Post op day(s):  Sierra Lloyd   Interval History:  Patient seen and examined in ICU, history limited secondary to mental status no acute events or new complaints overnight.  No new labs, procalcitonin stable at 14 No fevers overnight She is off vasopressors Reviewed previous day's notes; plan for Niece to review chart and work with palliative care to determine GOCs  Review of Systems:  Unable to reliably preform secondary to dementia, noncommunicating    Vital signs in last 24 hours: [min-max] current  Temp:  [97.6 F (36.4 C)-98.6 F (37 C)] 98.6 F (37 C) (06/25 0200) Pulse Rate:  [60-78] 77 (06/25 0500) Resp:  [12-27] 27 (06/25 0500) BP: (87-121)/(31-64) 106/55 (06/25 0500) SpO2:  [96 %-100 %] 96 % (06/25 0500)     Height: 5\' 1"  (154.9 cm) Weight: 64.1 kg BMI (Calculated): 26.72   Intake/Output last 2 shifts:  06/24 0701 - 06/25 0700 In: 1293.1 [I.V.:100; IV Piggyback:1193.1] Out: 290 [Urine:290]   Physical Exam:  Constitutional: Resting in bed, winces to painful stimuli, non-communicative, does not open eyes HENT: normocephalic without obvious abnormality  Respiratory: breathing non-labored at rest  Cardiovascular: regular rate and sinus rhythm  Gastrointestinal: Soft, she appears to wince with palpation of the loser abdomen, I do not appreciate significant distension, unable to assess for rebound/guarding reliably.  Musculoskeletal: Markedly edematous upper and lower extremities bilaterally   Labs:  CBC Latest Ref Rng & Units 05/15/2020 05/14/2020 05/05/2020  WBC 4.0 - 10.5 K/uL 10.3 12.8(H) 6.7  Hemoglobin 12.0 - 15.0 g/dL 05/15/2020) 1.9(J) 4.7(W)  Hematocrit 36 - 46 % 26.6(L) 28.0(L) 29.5(L)  Platelets 150 - 400 K/uL 263 368 385   CMP Latest Ref Rng & Units 05/15/2020 05/14/2020 05/20/2020  Glucose 70 - 99 mg/dL 05/15/2020) 621(H) -  BUN 8 - 23 mg/dL 086(V) 78(I) -  Creatinine 0.44 - 1.00 mg/dL  69(G) 2.95(M) -  Sodium 135 - 145 mmol/L 141 139 -  Potassium 3.5 - 5.1 mmol/L 4.7 5.2(H) 5.1  Chloride 98 - 111 mmol/L 109 106 -  CO2 22 - 32 mmol/L 22 21(L) -  Calcium 8.9 - 10.3 mg/dL 7.6(L) 8.0(L) -  Total Protein 6.5 - 8.1 g/dL - 4.9(L) -  Total Bilirubin 0.3 - 1.2 mg/dL - 0.9 -  Alkaline Phos 38 - 126 U/L - 46 -  AST 15 - 41 U/L - 26 -  ALT 0 - 44 U/L - 13 -    Imaging studies: No new pertinent imaging studies   Assessment/Plan: (ICD-10's: K39.92) 86 y.o. female with sepsis secondary to diverticulitis as most likely culprit, complicated by multiple pertinent comorbidities including dementia and debilitation.   - Appreciate palliative care; determine GOCs; niece reviewing records  - No surgical intervention indicated, nor do I think she would do well with any intervention  - Continue IV ABx (Zosyn)  - Pain control prn  - Monitor abdominal examination; fever curve  - Further management per PCCM; we will follow    All of the above findings and recommendations were discussed with the medical team.    -- 97, PA-C [facility] 05/16/2020, 7:45 AM [phone] M-F: 7am - 4pm

## 2020-05-16 NOTE — Progress Notes (Signed)
CH received PG from RN Dorathy Daft; pt.'s niece at bedside requesting access to pt.'s MyChart info, but niece not MPOA, RN shared; niece wants to be made MPOA, bu RN explained to niece that pt. not A/O at this time and so paperwork cannot be signed; RN requested additional info re: MPOA process and CH confirmed what RN had told niece --> pt.'s capacity to sign is required.  CH said he was in the middle of an emergent situation in ED but could come sit w/niece and talk with her in about a half hour. CH unable to follow up until approx. hr. after page; when [facility] arrived on ICU, pt. alone in rm; NP on telephone w/niece who had left.  No further needs apparent at this time, but [facility] made referral to [facility] [place] to be aware of situation in AM.      05/14/20 2040  Clinical Encounter Type  Visited With Health care provider  Visit Type Initial;Social support  Referral From Nurse  Stress Factors  Family Stress Factors Loss of control;Lack of knowledge

## 2020-05-16 NOTE — Progress Notes (Signed)
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  I spoke to Niece Andreas Ohmerri Ross who states she was unaware of CVA, CKD, severe malnutrition, was under impression that patient is doing well in SNF and at this time wishes to continue full scope of care.   Patient with progressive decline and progressive renal failure   Lines / Drains: PIV  Cultures / Sepsis markers: Blood cx-gram negative rods+  Antibiotics: Flagyl, cefepime, zosyn>>zosyn   Protocols / Consultants: hospitalist , pccm, surgery, pharmD  Tests / Events: CT PE, CT abd  05/16/20- patient is minimally responsive on exam this am, she is has decreased UOP despite IVF since admission, she is septic and critically ill with overall poor prognosis due to multiple comorbid conditions with advanced age.  I discussed care plan with patients niece Andreas Ohm([name] Ross) who states she does not have power of attorney but has been involved with medical decisions.    6/25 progressive renal failure and encephalopathy    CC  follow up septic shock  SUBJECTIVE Patient remains critically ill Prognosis is guarded Progressive renal failure   BP (!) 130/51   Pulse 73   Temp 98.3 F (36.8 C) (Axillary)   Resp (!) 29   Ht 5\' 1"  (1.549 m)   Wt 64.1 kg   SpO2 97%   BMI 26.70 kg/m    I/O last 3 completed shifts: In: 1640.3 [I.V.:390; IV Piggyback:1250.3] Out: 475 [Urine:475] Total I/O In: 76.4 [Other:45; IV Piggyback:31.4] Out: -   SpO2: 97 % O2 Flow Rate (L/min): 2 L/min  Estimated body mass index is 26.7 kg/m as calculated from the following:   Height as of this encounter: 5\' 1"  (1.549 m).   Weight as of this encounter: 64.1 kg.  SIGNIFICANT EVENTS   REVIEW OF SYSTEMS  PATIENT IS UNABLE TO PROVIDE COMPLETE REVIEW OF  SYSTEMS DUE TO SEVERE CRITICAL ILLNESS        PHYSICAL EXAMINATION:  GENERAL:critically ill appearing,  HEAD: Normocephalic, atraumatic.  EYES: Pupils equal, round, reactive to light.  No scleral icterus.  MOUTH: Moist mucosal membrane. NECK: Supple.  PULMONARY: +rhonchi, +wheezing CARDIOVASCULAR: S1 and S2. Regular rate and rhythm. No murmurs, rubs, or gallops.  GASTROINTESTINAL: Soft, nontender, -distended.  Positive bowel sounds.   MUSCULOSKELETAL: No swelling, clubbing, or edema.  NEUROLOGIC: obtunded, GCS<8 SKIN:intact,warm,dry  MEDICATIONS: I have reviewed all medications and confirmed regimen as documented   CULTURE RESULTS   Recent Results (from the past 240 hour(s))  SARS Coronavirus 2 by RT PCR (hospital order, performed in [facility] hospital lab) Nasopharyngeal Nasopharyngeal Swab     Status: None   Collection Time: 05/08/20  4:11 PM   Specimen: Nasopharyngeal Swab  Result Value Ref Range Status   SARS Coronavirus 2 NEGATIVE NEGATIVE Final    Comment: (NOTE) SARS-CoV-2 target nucleic acids are NOT DETECTED.  The SARS-CoV-2 RNA is generally detectable in upper and lower respiratory specimens during the acute phase of infection. The lowest concentration of SARS-CoV-2 viral copies this assay can detect is 250 copies / mL. A negative result does not preclude SARS-CoV-2 infection and should not be used as the sole basis for treatment or other patient management decisions.  A negative result may occur with improper specimen collection / handling, submission of specimen other than nasopharyngeal swab,  presence of viral mutation(s) within the areas targeted by this assay, and inadequate number of viral copies (<250 copies / mL). A negative result must be combined with clinical observations, patient history, and epidemiological information.  Fact Sheet for Patients:   StrictlyIdeas.no  Fact Sheet for Healthcare  Providers: BankingDealers.co.za  This test is not yet approved or  cleared by the [place] FDA and has been authorized for detection and/or diagnosis of SARS-CoV-2 by FDA under an Emergency Use Authorization (EUA).  This EUA will remain in effect (meaning this test can be used) for the duration of the COVID-19 declaration under Section 564(b)(1) of the Act, 21 U.S.C. section 360bbb-3(b)(1), unless the authorization is terminated or revoked sooner.  Performed at [facility], [address], [place], [place] [address]   Blood Culture (routine x 2)     Status: Abnormal (Preliminary result)   Collection Time: 05/27/20  9:40 AM   Specimen: BLOOD  Result Value Ref Range Status   Specimen Description   Final    BLOOD RIGHT FA Performed at [facility], [address] [place], [place] [address]    Special Requests   Final    BOTTLES DRAWN AEROBIC AND ANAEROBIC Blood Culture results may not be optimal due to an excessive volume of blood received in culture bottles Performed at [facility], [address] [place], [place] [address]    Culture  Setup Time   Final    Organism ID to follow GRAM NEGATIVE RODS ANAEROBIC BOTTLE ONLY GRAM POSITIVE COCCI AEROBIC BOTTLE ONLY CRITICAL RESULT CALLED TO, READ BACK BY AND VERIFIED WITH: [place] SHANLEVER AT 1430 ON 05/14/20 SNG Performed at [facility], [address] [place], [place] [address]    Culture (A)  Final    STAPHYLOCOCCUS SPECIES (COAGULASE NEGATIVE) THE SIGNIFICANCE OF ISOLATING THIS ORGANISM FROM A SINGLE SET OF BLOOD CULTURES WHEN MULTIPLE SETS ARE DRAWN IS UNCERTAIN. PLEASE NOTIFY THE MICROBIOLOGY DEPARTMENT WITHIN ONE WEEK IF SPECIATION AND SENSITIVITIES ARE REQUIRED. ANAEROBIC GRAM NEGATIVE ROD    Report Status PENDING  Incomplete  Blood Culture (routine x 2)     Status: None (Preliminary result)   Collection Time: 27-May-2020  9:40 AM   Specimen: BLOOD  Result  Value Ref Range Status   Specimen Description BLOOD RIGHT AC  Final   Special Requests   Final    BOTTLES DRAWN AEROBIC AND ANAEROBIC Blood Culture adequate volume   Culture  Setup Time   Final    ANAEROBIC BOTTLE ONLY GRAM NEGATIVE RODS CRITICAL VALUE NOTED.  VALUE IS CONSISTENT WITH PREVIOUSLY REPORTED AND CALLED VALUE. Performed at [facility], [address] [place], [place] [address]    Culture GRAM NEGATIVE RODS  Final   Report Status PENDING  Incomplete  Urine culture     Status: Abnormal   Collection Time: 05/27/20  9:40 AM   Specimen: Urine, Random  Result Value Ref Range Status   Specimen Description   Final    URINE, RANDOM Performed at [facility], [address] [place], [place] [address]    Special Requests   Final    NONE Performed at [facility], [address] [place], [place] [address]    Culture >=100,000 COLONIES/mL KLEBSIELLA PNEUMONIAE (A)  Final   Report Status 05/15/2020 FINAL  Final   Organism ID, Bacteria KLEBSIELLA PNEUMONIAE (A)  Final      Susceptibility   Klebsiella pneumoniae - MIC*    AMPICILLIN >=32 RESISTANT  Resistant     CEFAZOLIN <=4 SENSITIVE Sensitive     CEFTRIAXONE <=0.25 SENSITIVE Sensitive     CIPROFLOXACIN <=0.25 SENSITIVE Sensitive     GENTAMICIN <=1 SENSITIVE Sensitive     IMIPENEM <=0.25 SENSITIVE Sensitive     NITROFURANTOIN 64 INTERMEDIATE Intermediate     TRIMETH/SULFA <=20 SENSITIVE Sensitive     AMPICILLIN/SULBACTAM 4 SENSITIVE Sensitive     PIP/TAZO <=4 SENSITIVE Sensitive     * >=100,000 COLONIES/mL KLEBSIELLA PNEUMONIAE  Blood Culture ID Panel (Reflexed)     Status: None   Collection Time: 04/25/2020  9:40 AM  Result Value Ref Range Status   Enterococcus species NOT DETECTED NOT DETECTED Final   Listeria monocytogenes NOT DETECTED NOT DETECTED Final   Staphylococcus species NOT DETECTED NOT DETECTED Final   Staphylococcus aureus (BCID) NOT DETECTED NOT DETECTED Final   Streptococcus  species NOT DETECTED NOT DETECTED Final   Streptococcus agalactiae NOT DETECTED NOT DETECTED Final   Streptococcus pneumoniae NOT DETECTED NOT DETECTED Final   Streptococcus pyogenes NOT DETECTED NOT DETECTED Final   Acinetobacter baumannii NOT DETECTED NOT DETECTED Final   Enterobacteriaceae species NOT DETECTED NOT DETECTED Final   Enterobacter cloacae complex NOT DETECTED NOT DETECTED Final   Escherichia coli NOT DETECTED NOT DETECTED Final   Klebsiella oxytoca NOT DETECTED NOT DETECTED Final   Klebsiella pneumoniae NOT DETECTED NOT DETECTED Final   Proteus species NOT DETECTED NOT DETECTED Final   Serratia marcescens NOT DETECTED NOT DETECTED Final   Haemophilus influenzae NOT DETECTED NOT DETECTED Final   Neisseria meningitidis NOT DETECTED NOT DETECTED Final   Pseudomonas aeruginosa NOT DETECTED NOT DETECTED Final   Candida albicans NOT DETECTED NOT DETECTED Final   Candida glabrata NOT DETECTED NOT DETECTED Final   Candida krusei NOT DETECTED NOT DETECTED Final   Candida parapsilosis NOT DETECTED NOT DETECTED Final   Candida tropicalis NOT DETECTED NOT DETECTED Final    Comment: Performed at [facility], [address] [place], [place] [address]  Blood Culture ID Panel (Reflexed)     Status: Abnormal   Collection Time: 04/24/2020  9:40 AM  Result Value Ref Range Status   Enterococcus species NOT DETECTED NOT DETECTED Final   Listeria monocytogenes NOT DETECTED NOT DETECTED Final   Staphylococcus species DETECTED (A) NOT DETECTED Final    Comment: Methicillin (oxacillin) susceptible coagulase negative staphylococcus. Possible blood culture contaminant (unless isolated from more than one blood culture draw or clinical case suggests pathogenicity). No antibiotic treatment is indicated for blood  culture contaminants. CRITICAL RESULT CALLED TO, READ BACK BY AND VERIFIED WITH: CHARLES SHANLEVER AT 1430 ON 05/14/20 SNG    Staphylococcus aureus (BCID) NOT DETECTED NOT  DETECTED Final   Methicillin resistance NOT DETECTED NOT DETECTED Final   Streptococcus species NOT DETECTED NOT DETECTED Final   Streptococcus agalactiae NOT DETECTED NOT DETECTED Final   Streptococcus pneumoniae NOT DETECTED NOT DETECTED Final   Streptococcus pyogenes NOT DETECTED NOT DETECTED Final   Acinetobacter baumannii NOT DETECTED NOT DETECTED Final   Enterobacteriaceae species NOT DETECTED NOT DETECTED Final   Enterobacter cloacae complex NOT DETECTED NOT DETECTED Final   Escherichia coli NOT DETECTED NOT DETECTED Final   Klebsiella oxytoca NOT DETECTED NOT DETECTED Final   Klebsiella pneumoniae NOT DETECTED NOT DETECTED Final   Proteus species NOT DETECTED NOT DETECTED Final   Serratia marcescens NOT DETECTED NOT DETECTED Final   Haemophilus influenzae NOT DETECTED NOT DETECTED Final   Neisseria meningitidis NOT DETECTED NOT DETECTED Final  Pseudomonas aeruginosa NOT DETECTED NOT DETECTED Final   Candida albicans NOT DETECTED NOT DETECTED Final   Candida glabrata NOT DETECTED NOT DETECTED Final   Candida krusei NOT DETECTED NOT DETECTED Final   Candida parapsilosis NOT DETECTED NOT DETECTED Final   Candida tropicalis NOT DETECTED NOT DETECTED Final    Comment: Performed at [facility], [address] [place], [place] [address]  SARS Coronavirus 2 by RT PCR (hospital order, performed in [facility] hospital lab) Nasopharyngeal Nasopharyngeal Swab     Status: None   Collection Time: 05/20/2020 11:01 AM   Specimen: Nasopharyngeal Swab  Result Value Ref Range Status   SARS Coronavirus 2 NEGATIVE NEGATIVE Final    Comment: (NOTE) SARS-CoV-2 target nucleic acids are NOT DETECTED.  The SARS-CoV-2 RNA is generally detectable in upper and lower respiratory specimens during the acute phase of infection. The lowest concentration of SARS-CoV-2 viral copies this assay can detect is 250 copies / mL. A negative result does not preclude SARS-CoV-2 infection and should not be  used as the sole basis for treatment or other patient management decisions.  A negative result may occur with improper specimen collection / handling, submission of specimen other than nasopharyngeal swab, presence of viral mutation(s) within the areas targeted by this assay, and inadequate number of viral copies (<250 copies / mL). A negative result must be combined with clinical observations, patient history, and epidemiological information.  Fact Sheet for Patients:   BoilerBrush.com.cy  Fact Sheet for Healthcare Providers: https://pope.com/  This test is not yet approved or  cleared by the [place] FDA and has been authorized for detection and/or diagnosis of SARS-CoV-2 by FDA under an Emergency Use Authorization (EUA).  This EUA will remain in effect (meaning this test can be used) for the duration of the COVID-19 declaration under Section 564(b)(1) of the Act, 21 U.S.C. section 360bbb-3(b)(1), unless the authorization is terminated or revoked sooner.  Performed at [facility], [address] [place], [place] [address]   MRSA PCR Screening     Status: None   Collection Time: 05/17/2020 11:33 PM   Specimen: Nasopharyngeal  Result Value Ref Range Status   MRSA by PCR NEGATIVE NEGATIVE Final    Comment:        The GeneXpert MRSA Assay (FDA approved for NASAL specimens only), is one component of a comprehensive MRSA colonization surveillance program. It is not intended to diagnose MRSA infection nor to guide or monitor treatment for MRSA infections. Performed at [facility], [address] [place], [place] [address]        ASSESSMENT AND PLAN SYNOPSIS   Severe ACUTE SEPTIC SHCOK FROM ACUTE DIVERTICULITIS WITH PROGRESSIVE RENAL FAILURE AND Encephalopathy  High risk for intubation and high risk for cardiac arrest GOC addressed with Niece Andreas Ohm, she has asked for lab work to be  posted   ACUTE KIDNEY INJURY/Renal Failure -continue Foley Catheter-assess need -Avoid nephrotoxic agents -Follow urine output, BMP -Ensure adequate renal perfusion, optimize oxygenation -Renal dose medications     NEUROLOGY Acute toxic metabolic encephalopathy  SHOCK-SEPSIS/HYPOVOLUMIC/CARDIOGENIC Not on pressors but has progressed to renal failure -use vasopressors to keep MAP>65 if needed -follow ABG and LA -follow up cultures -emperic ABX -consider stress dose steroids -aggressive IV fluid resuscitation  CARDIAC ICU monitoring  ID -continue IV abx as prescibed -follow up cultures  GI GI PROPHYLAXIS as indicated  NUTRITIONAL STATUS Nutrition Status:         DIET-->NPO Constipation protocol as indicated  ENDO - will use ICU hypoglycemic\Hyperglycemia protocol if indicated     ELECTROLYTES -follow labs as needed -replace as needed -pharmacy consultation and following   DVT/GI PRX ordered and assessed TRANSFUSIONS AS NEEDED MONITOR FSBS I Assessed the need for Labs I Assessed the need for Foley I Assessed the need for Central Venous Line Family Discussion when available I Assessed the need for Mobilization I made an Assessment of medications to be adjusted accordingly Safety Risk assessment completed   CASE DISCUSSED IN MULTIDISCIPLINARY ROUNDS WITH ICU TEAM  Critical Care Time devoted to patient care services described in this note is 34 minutes.   Overall, patient is critically ill, prognosis is guarded.  Patient with Multiorgan failure and at high risk for cardiac arrest and death.   Recommend DNR/DNI status  Cagney Degrace Santiago [place], M.D.  [facility]  Medical Director [facility] Medical Director [facility] Cardio-Pulmonary Department

## 2020-05-17 DIAGNOSIS — N170 Acute kidney failure with tubular necrosis: Secondary | ICD-10-CM

## 2020-05-17 DIAGNOSIS — R778 Other specified abnormalities of plasma proteins: Secondary | ICD-10-CM

## 2020-05-17 DIAGNOSIS — F411 Generalized anxiety disorder: Secondary | ICD-10-CM

## 2020-05-17 DIAGNOSIS — N183 Chronic kidney disease, stage 3 unspecified: Secondary | ICD-10-CM

## 2020-05-17 LAB — BASIC METABOLIC PANEL
Anion gap: 14 (ref 5–15)
BUN: 74 mg/dL — ABNORMAL HIGH (ref 8–23)
CO2: 16 mmol/L — ABNORMAL LOW (ref 22–32)
Calcium: 8.1 mg/dL — ABNORMAL LOW (ref 8.9–10.3)
Chloride: 113 mmol/L — ABNORMAL HIGH (ref 98–111)
Creatinine, Ser: 1.8 mg/dL — ABNORMAL HIGH (ref 0.44–1.00)
GFR calc Af Amer: 29 mL/min — ABNORMAL LOW (ref >60)
GFR calc non Af Amer: 25 mL/min — ABNORMAL LOW (ref >60)
Glucose, Bld: 200 mg/dL — ABNORMAL HIGH (ref 70–99)
Potassium: 4 mmol/L (ref 3.5–5.1)
Sodium: 143 mmol/L (ref 135–145)

## 2020-05-17 LAB — GLUCOSE, CAPILLARY
Glucose-Capillary: 205 mg/dL — ABNORMAL HIGH (ref 70–99)
Glucose-Capillary: 188 mg/dL — ABNORMAL HIGH (ref 70–99)
Glucose-Capillary: 181 mg/dL — ABNORMAL HIGH (ref 70–99)
Glucose-Capillary: 186 mg/dL — ABNORMAL HIGH (ref 70–99)

## 2020-05-17 LAB — CBC WITH DIFFERENTIAL/PLATELET
Abs Immature Granulocytes: 0.06 10*3/uL (ref 0.00–0.07)
Basophils Absolute: 0.1 10*3/uL (ref 0.0–0.1)
Basophils Relative: 1 %
Eosinophils Absolute: 0 10*3/uL (ref 0.0–0.5)
Eosinophils Relative: 0 %
HCT: 23.2 % — ABNORMAL LOW (ref 36.0–46.0)
Hemoglobin: 7.6 g/dL — ABNORMAL LOW (ref 12.0–15.0)
Immature Granulocytes: 1 %
Lymphocytes Relative: 11 %
Lymphs Abs: 0.8 10*3/uL (ref 0.7–4.0)
MCH: 29.3 pg (ref 26.0–34.0)
MCHC: 32.8 g/dL (ref 30.0–36.0)
MCV: 89.6 fL (ref 80.0–100.0)
Monocytes Absolute: 0.3 10*3/uL (ref 0.1–1.0)
Monocytes Relative: 4 %
Neutro Abs: 6.5 10*3/uL (ref 1.7–7.7)
Neutrophils Relative %: 83 %
Platelets: 242 10*3/uL (ref 150–400)
RBC: 2.59 MIL/uL — ABNORMAL LOW (ref 3.87–5.11)
RDW: 16.2 % — ABNORMAL HIGH (ref 11.5–15.5)
WBC: 7.7 10*3/uL (ref 4.0–10.5)
nRBC: 0.3 % — ABNORMAL HIGH (ref 0.0–0.2)

## 2020-05-17 LAB — PROCALCITONIN: Procalcitonin: 7.98 ng/mL

## 2020-05-17 MED ORDER — FUROSEMIDE 10 MG/ML IJ SOLN
20.0000 mg | Freq: Once | INTRAMUSCULAR | Status: AC
  Administered 2020-05-17: 20 mg via INTRAVENOUS
  Filled 2020-05-17: qty 2

## 2020-05-17 NOTE — Consult Note (Signed)
Pharmacy Antibiotic Note  Sierra Lloyd is a 86 y.o. female admitted on 2020-06-09 with diverticulitis with microperforation.  Pharmacy has been consulted for pip/tazo dosing.  Today, 05/17/2020  Day 4 antibiotics  6/22 Bcx CoNS 1/4 bottles.  GNR 2/2 sets but anaerobic bottles only.FUSOBACTERIUM NUCLEATUM-anaerobe    SCr worsening with low UOP  Afebrile  WBC WNL  Plan:  Will continue piperacillin/tazobactam to 2.25gm IV q6h but Crcl is borderline for change to q8h   F/u ability to tailor antibiotics with final results  Height: 5\' 1"  (154.9 cm) Weight: 64.1 kg (141 lb 5 oz) IBW/kg (Calculated) : 47.8  Temp (24hrs), Avg:98.8 F (37.1 C), Min:98.6 F (37 C), Max:99.1 F (37.3 C)  Recent Labs  Lab 06/09/2020 0940 09-Jun-2020 1101 05/14/20 0234 05/14/20 0618 05/15/20 0408 05/16/20 0610 05/16/20 1104 05/16/20 1306 05/17/20 0627  WBC 6.7  --  12.8*  --  10.3  --   --   --  7.7  CREATININE 1.52*  --  1.60*  --  1.71* 1.69*  --   --  1.80*  LATICACIDVEN 2.8*   < > 3.3* 2.8* 2.2*  --  1.4 1.7  --    < > = values in this interval not displayed.    Estimated Creatinine Clearance: 19.9 mL/min (A) (by C-G formula based on SCr of 1.8 mg/dL (H)).    Allergies  Allergen Reactions  . Tetracycline     Roof of mouth broke out.    Antimicrobials this admission: 6/22 cefepime, vancomycin, and flagyl x 1 6/22 pip/tazo >>   Dose adjustments this admission: None  Microbiology results: 6/22 BCx: GNR anaerobic bottles only,  FUSOBACTERIUM NUCLEATUM 6/22 UCx: Klebsiella  Thank you for allowing pharmacy to be a part of this patient's care.  7/22 PharmD Clinical Pharmacist 05/17/2020

## 2020-05-17 NOTE — Progress Notes (Addendum)
PROGRESS NOTE    Sierra Lloyd  RWE:[id number] DOB: Oct 17, 1934 DOA: 05/17/2020 PCP: Margaretann Loveless, PA-C   Brief Narrative:  HPI On 04/24/2020 by Dr. Eileen Stanford is a 86 y.o. female with medical history significant of hypertension, diabetes mellitus, DVT-right leg (s/p of IVC filter), subdural hematoma, CKD stage III, depression with anxiety, who presents with altered mental status.  Patient has AMS, and is unable to provide accurate medical history. I spoke to her niece by phone, but she also does not know the detailed information since patient is in nursing home, therefore, most of the history is obtained by discussing the case with ED physician, per EMS report, and with the nursing staff.  Patient was recently hospitalized from 5/30-6/18 due to subdural hematoma. During the hospitalization she was also found to have a right DVT. Therefore she underwent IVC filter on 05/04/2020. Per report, pt was noted to be more confused and was unresponsive this AM. They were also concerned that patient had more peripheral swelling in her extremities.  Patient has obvious abdominal tenderness and abdominal distention on examination.  No active nausea vomiting, diarrhea noted.  No active respiratory distress and coughing noted.  Patient is barely arousable, does not follow command, slightly move extremities on painful stimuli.  Interim history Patient was admitted with severe sepsis with septic shock due to acute diverticulitis with microperforation.  She was also noted to have acute metabolic encephalopathy.  General surgery was consulted patient was not a surgical candidate and that her prognosis was very poor.  She was placed on IV fluids and antibiotics.  Given her septic shock, she was admitted to the ICU and placed on vasopressors.  Palliative care was also consulted due to patient's poor prognosis.  Currently she is DNR/DNI, however family would like full scope of care at this  time.  Assessment & Plan   Septic shock secondary to acute diverticulitis with microperforation/UTI -Present on admission -General surgery was consulted and appreciated, patient found to not be a surgical candidate and had a poor prognosis.  Recommended palliative care consult. -Given her shock, patient was admitted to the ICU and placed on vasopressors. -Blood cultures GNR, SCN -Urine culture urine culture >100K Klebsiella pneumonia -Was placed on IV fluids- however patient currently is 3rd spacing -Continue IV antibiotics-Zosyn -Procalcitonin improving, down to 7.98 (was 14.28 on 05/16/2020) -Lactic acidosis also improving-down to 1.7 (peaked to 3.2 on 6/23)  Acute kidney injury on chronic kidney disease, stage IIIB -Creatinine up to 1.8 today -Baseline creatinine approximately 0.8 -Suspect secondary to the above -Continue to monitor BMP  Acute metabolic encephalopathy -Possibly secondary to the above along with history of stroke  -CT head showed resolution of trace right frontal subdural hematoma on previous exam with residual bilateral chronic subdural hygromas in the frontal regions bilaterally -Currently patient is not responsive  Elevated troponin -Suspect secondary to demand ischemia and septic shock  DVT of the right lower extremity -Status post IVC filter on 05/04/2020 (previous admission)  Severe protein calorie malnutrition -Currently n.p.o. -Continue albumin  History of CVA and SDH -Complicating course with decreased prognosis  Hyperkalemia -Resolved, potassium currently 4 -Continue to monitor BMP  Diabetes mellitus, type II -Last hemoglobin A1c was 7.0 on 04/20/2020 -Continue insulin sliding scale with CBG monitoring  Depression/anxiety -Home medications held  Chronic normocytic anemia -Baseline hemoglobin approximately 8, currently 7.6 -Continue to monitor CBC  Third spacing  -likely secondary to malnutrition -continue albumin  -will give low dose  lasix to see if this will improve 3rd spacing   Goals of care -Palliative care consulted and appreciated  -Continue scope of care and treatment -Currently DNR/DNI  DVT Prophylaxis  SCDs  Code Status: DNR  Family Communication: Niece and Cousin at bedside  Disposition Plan:  Status is: Inpatient  Remains inpatient appropriate because:IV treatments appropriate due to intensity of illness or inability to take PO   Dispo: The patient is from: SNF              Anticipated d/c is to: TBD              Anticipated d/c date is: > 3 days              Patient currently is not medically stable to d/c.    Consultants PCCM General surgery  Procedures    Antibiotics   Anti-infectives (From admission, onward)   Start     Dose/Rate Route Frequency Ordered Stop   05/16/20 1800  piperacillin-tazobactam (ZOSYN) IVPB 2.25 g     Discontinue     2.25 g 100 mL/hr over 30 Minutes Intravenous Every 6 hours 05/16/20 1051     05/14/20 1000  piperacillin-tazobactam (ZOSYN) IVPB 3.375 g  Status:  Discontinued        3.375 g 12.5 mL/hr over 240 Minutes Intravenous Every 12 hours 04/22/2020 1230 04/22/2020 1235   05/14/20 1000  piperacillin-tazobactam (ZOSYN) IVPB 3.375 g  Status:  Discontinued        3.375 g 12.5 mL/hr over 240 Minutes Intravenous Every 8 hours 05/14/20 0848 05/16/20 1051   04/25/2020 2200  piperacillin-tazobactam (ZOSYN) IVPB 3.375 g  Status:  Discontinued        3.375 g 12.5 mL/hr over 240 Minutes Intravenous Every 12 hours 05/17/2020 1235 05/14/20 0848   04/27/2020 1600  metroNIDAZOLE (FLAGYL) IVPB 500 mg  Status:  Discontinued        500 mg 100 mL/hr over 60 Minutes Intravenous Every 12 hours 04/22/2020 1545 05/18/2020 1555   05/17/2020 0945  ceFEPIme (MAXIPIME) 2 g in sodium chloride 0.9 % 100 mL IVPB        2 g 200 mL/hr over 30 Minutes Intravenous  Once 04/26/2020 0943 05/08/2020 1100   04/26/2020 0945  metroNIDAZOLE (FLAGYL) IVPB 500 mg        500 mg 100 mL/hr over 60 Minutes Intravenous   Once 05/18/2020 0943 05/20/2020 1223   05/18/2020 0945  vancomycin (VANCOCIN) IVPB 1000 mg/200 mL premix        1,000 mg 200 mL/hr over 60 Minutes Intravenous  Once 05/11/2020 [id number] 05/16/2020 1228      Subjective:   Sierra Lloyd seen and examined today.  Patient nonverbal, but does open eyes.    Objective:   Vitals:   05/17/20 0500 05/17/20 0600 05/17/20 0800 05/17/20 1000  BP:  (!) 114/44 (!) 120/51 121/61  Pulse: 83 81 94 89  Resp: (!) 21 (!) 22 19 16   Temp:   99.1 F (37.3 C) 98.8 F (37.1 C)  TempSrc:      SpO2: 99% 97% 98% 99%  Weight:      Height:        Intake/Output Summary (Last 24 hours) at 05/17/2020 1207 Last data filed at 05/17/2020 1045 Gross per 24 hour  Intake 253.78 ml  Output 410 ml  Net -156.22 ml   Filed Weights   04/23/2020 0947 05/16/2020 2330  Weight: 50.6 kg 64.1 kg    Exam  General: Well  developed, critically ill appearing, NAD  HEENT: NCAT,  mucous membranes moist.   Cardiovascular: S1 S2 auscultated, RRR  Respiratory: Diminished breath sounds  Abdomen: Soft, nontender, mildly distended, + bowel sounds  Extremities: warm dry without cyanosis clubbing. ++LE/UE edema  Neuro: Awake however nonverbal and does not follow commands   Data Reviewed: I have personally reviewed following labs and imaging studies  CBC: Recent Labs  Lab 06-10-2020 0940 05/14/20 0234 05/15/20 0408 05/17/20 0627  WBC 6.7 12.8* 10.3 7.7  NEUTROABS 4.9 11.6* 9.1* 6.5  HGB 9.4* 8.7* 8.5* 7.6*  HCT 29.5* 28.0* 26.6* 23.2*  MCV 92.8 93.3 91.7 89.6  PLT 385 368 263 242   Basic Metabolic Panel: Recent Labs  Lab June 10, 2020 0940 June 10, 2020 0940 June 10, 2020 1554 05/14/20 0234 05/15/20 0408 05/16/20 0610 05/17/20 0627  NA 138  --   --  139 141 141 143  K 5.4*   < > 5.1 5.2* 4.7 4.3 4.0  CL 102  --   --  106 109 110 113*  CO2 20*  --   --  21* 22 18* 16*  GLUCOSE 201*  --   --  212* 156* 153* 200*  BUN 49*  --   --  54* 64* 69* 74*  CREATININE 1.52*  --   --  1.60*  1.71* 1.69* 1.80*  CALCIUM 8.7*  --   --  8.0* 7.6* 7.9* 8.1*  MG  --   --   --  2.1 2.2  --   --   PHOS  --   --   --  4.2 3.6  --   --    < > = values in this interval not displayed.   GFR: Estimated Creatinine Clearance: 19.9 mL/min (A) (by C-G formula based on SCr of 1.8 mg/dL (H)). Liver Function Tests: Recent Labs  Lab Jun 10, 2020 0940 05/14/20 0234 05/16/20 0610  AST 17 26 47*  ALT ALKPHOS 62 46 61  BILITOT 1.5* 0.9 1.1  PROT 5.1* 4.9* 4.4*  ALBUMIN 1.8* 1.9* 1.6*   No results for input(s): LIPASE, AMYLASE in the last 168 hours. Recent Labs  Lab Jun 10, 2020 1101  AMMONIA <9*   Coagulation Profile: Recent Labs  Lab 06-10-20 0940  INR 1.1   Cardiac Enzymes: No results for input(s): CKTOTAL, CKMB, CKMBINDEX, TROPONINI in the last 168 hours. BNP (last 3 results) No results for input(s): PROBNP in the last 8760 hours. HbA1C: No results for input(s): HGBA1C in the last 72 hours. CBG: Recent Labs  Lab 05/16/20 1117 05/16/20 1549 05/16/20 2114 05/17/20 0739 05/17/20 1139  GLUCAP 143* 161* 169* 205* 188*   Lipid Profile: No results for input(s): CHOL, HDL, LDLCALC, TRIG, CHOLHDL, LDLDIRECT in the last 72 hours. Thyroid Function Tests: No results for input(s): TSH, T4TOTAL, FREET4, T3FREE, THYROIDAB in the last 72 hours. Anemia Panel: No results for input(s): VITAMINB12, FOLATE, FERRITIN, TIBC, IRON, RETICCTPCT in the last 72 hours. Urine analysis:    Component Value Date/Time   COLORURINE AMBER (A) 06/10/20 0940   APPEARANCEUR CLOUDY (A) 2020-06-10 0940   LABSPEC 1.026 06-10-20 0940   PHURINE 5.0 Jun 10, 2020 0940   GLUCOSEU NEGATIVE June 10, 2020 0940   HGBUR NEGATIVE Jun 10, 2020 0940   BILIRUBINUR NEGATIVE 06-10-20 0940   KETONESUR 5 (A) 06/10/20 0940   PROTEINUR NEGATIVE 06-10-20 0940   NITRITE NEGATIVE 06/10/20 0940   LEUKOCYTESUR NEGATIVE June 10, 2020 0940   Sepsis Labs: (procalcitonin:4,lacticidven:4)  ) Recent Results  (from the past 240 hour(s))  SARS Coronavirus  2 by RT PCR (hospital order, performed in [facility] hospital lab) Nasopharyngeal Nasopharyngeal Swab     Status: None   Collection Time: 05/08/20  4:11 PM   Specimen: Nasopharyngeal Swab  Result Value Ref Range Status   SARS Coronavirus 2 NEGATIVE NEGATIVE Final    Comment: (NOTE) SARS-CoV-2 target nucleic acids are NOT DETECTED.  The SARS-CoV-2 RNA is generally detectable in upper and lower respiratory specimens during the acute phase of infection. The lowest concentration of SARS-CoV-2 viral copies this assay can detect is 250 copies / mL. A negative result does not preclude SARS-CoV-2 infection and should not be used as the sole basis for treatment or other patient management decisions.  A negative result may occur with improper specimen collection / handling, submission of specimen other than nasopharyngeal swab, presence of viral mutation(s) within the areas targeted by this assay, and inadequate number of viral copies (<250 copies / mL). A negative result must be combined with clinical observations, patient history, and epidemiological information.  Fact Sheet for Patients:   BoilerBrush.com.cy  Fact Sheet for Healthcare Providers: https://pope.com/  This test is not yet approved or  cleared by the [place] FDA and has been authorized for detection and/or diagnosis of SARS-CoV-2 by FDA under an Emergency Use Authorization (EUA).  This EUA will remain in effect (meaning this test can be used) for the duration of the COVID-19 declaration under Section 564(b)(1) of the Act, 21 U.S.C. section 360bbb-3(b)(1), unless the authorization is terminated or revoked sooner.  Performed at [facility], [address], [place], [place] [address]   Blood Culture (routine x 2)     Status: Abnormal (Preliminary result)   Collection Time: 05/20/20  9:40 AM   Specimen: BLOOD   Result Value Ref Range Status   Specimen Description   Final    BLOOD RIGHT FA Performed at [facility], [address] [place], [place] [address]    Special Requests   Final    BOTTLES DRAWN AEROBIC AND ANAEROBIC Blood Culture results may not be optimal due to an excessive volume of blood received in culture bottles Performed at [facility], [address] [place], [place] [address]    Culture  Setup Time   Final    Organism ID to follow GRAM NEGATIVE RODS ANAEROBIC BOTTLE ONLY GRAM POSITIVE COCCI AEROBIC BOTTLE ONLY CRITICAL RESULT CALLED TO, READ BACK BY AND VERIFIED WITH: Houstonia SHANLEVER AT 1430 ON 05/14/20 SNG Performed at [facility], [address] [place], [place] [address]    Culture (A)  Final    STAPHYLOCOCCUS SPECIES (COAGULASE NEGATIVE) THE SIGNIFICANCE OF ISOLATING THIS ORGANISM FROM A SINGLE SET OF BLOOD CULTURES WHEN MULTIPLE SETS ARE DRAWN IS UNCERTAIN. PLEASE NOTIFY THE MICROBIOLOGY DEPARTMENT WITHIN ONE WEEK IF SPECIATION AND SENSITIVITIES ARE REQUIRED. FUSOBACTERIUM NUCLEATUM BETA LACTAMASE NEGATIVE CULTURE REINCUBATED FOR BETTER GROWTH Performed at [facility], [address], [place], [place] [address]    Report Status PENDING  Incomplete  Blood Culture (routine x 2)     Status: Abnormal (Preliminary result)   Collection Time: 2020/05/20  9:40 AM   Specimen: BLOOD  Result Value Ref Range Status   Specimen Description   Final    BLOOD RIGHT [facility] Performed at [facility], [address] [place], [place] [address]    Special Requests   Final    BOTTLES DRAWN AEROBIC AND ANAEROBIC Blood Culture adequate volume Performed at [facility], [address]  [place], [place] [address]    Culture  Setup Time   Final    ANAEROBIC BOTTLE ONLY GRAM NEGATIVE RODS CRITICAL VALUE NOTED.  VALUE IS CONSISTENT WITH PREVIOUSLY REPORTED AND CALLED VALUE. Performed at [facility], [address] [place], [place] [address]    Culture (A)  Final    ANAEROBIC GRAM NEGATIVE ROD CULTURE REINCUBATED FOR BETTER GROWTH Performed at [facility], [address], [place], [place] [address]    Report Status PENDING  Incomplete  Urine culture     Status: Abnormal   Collection Time: 05/07/2020  9:40 AM   Specimen: Urine, Random  Result Value Ref Range Status   Specimen Description   Final    URINE, RANDOM Performed at [facility], [address] [place], [place] [address]    Special Requests   Final    NONE Performed at [facility], [address] [place], [place] [address]    Culture >=100,000 COLONIES/mL KLEBSIELLA PNEUMONIAE (A)  Final   Report Status 05/15/2020 FINAL  Final   Organism ID, Bacteria KLEBSIELLA PNEUMONIAE (A)  Final      Susceptibility   Klebsiella pneumoniae - MIC*    AMPICILLIN >=32 RESISTANT Resistant     CEFAZOLIN <=4 SENSITIVE Sensitive     CEFTRIAXONE <=0.25 SENSITIVE Sensitive     CIPROFLOXACIN <=0.25 SENSITIVE Sensitive     GENTAMICIN <=1 SENSITIVE Sensitive     IMIPENEM <=0.25 SENSITIVE Sensitive     NITROFURANTOIN 64 INTERMEDIATE Intermediate     TRIMETH/SULFA <=20 SENSITIVE Sensitive     AMPICILLIN/SULBACTAM 4 SENSITIVE Sensitive     PIP/TAZO <=4 SENSITIVE Sensitive     * >=100,000 COLONIES/mL KLEBSIELLA PNEUMONIAE  Blood Culture ID Panel (Reflexed)     Status: None   Collection Time: 04/29/2020  9:40 AM  Result Value Ref Range Status   Enterococcus species NOT DETECTED NOT DETECTED Final   Listeria monocytogenes NOT DETECTED NOT DETECTED Final   Staphylococcus species NOT DETECTED NOT DETECTED Final   Staphylococcus aureus (BCID) NOT DETECTED NOT DETECTED Final   Streptococcus species NOT DETECTED NOT DETECTED Final   Streptococcus agalactiae NOT DETECTED NOT DETECTED Final   Streptococcus pneumoniae NOT DETECTED NOT DETECTED Final   Streptococcus pyogenes NOT DETECTED NOT DETECTED Final   Acinetobacter baumannii NOT  DETECTED NOT DETECTED Final   Enterobacteriaceae species NOT DETECTED NOT DETECTED Final   Enterobacter cloacae complex NOT DETECTED NOT DETECTED Final   Escherichia coli NOT DETECTED NOT DETECTED Final   Klebsiella oxytoca NOT DETECTED NOT DETECTED Final   Klebsiella pneumoniae NOT DETECTED NOT DETECTED Final   Proteus species NOT DETECTED NOT DETECTED Final   Serratia marcescens NOT DETECTED NOT DETECTED Final   Haemophilus influenzae NOT DETECTED NOT DETECTED Final   Neisseria meningitidis NOT DETECTED NOT DETECTED Final   Pseudomonas aeruginosa NOT DETECTED NOT DETECTED Final   Candida albicans NOT DETECTED NOT DETECTED Final   Candida glabrata NOT DETECTED NOT DETECTED Final   Candida krusei NOT DETECTED NOT DETECTED Final   Candida parapsilosis NOT DETECTED NOT DETECTED Final   Candida tropicalis NOT DETECTED NOT DETECTED Final    Comment: Performed at [facility], [address] [place], [place] [address]  Blood Culture ID Panel (Reflexed)     Status: Abnormal   Collection Time: 05/21/2020  9:40 AM  Result Value Ref Range Status   Enterococcus species NOT DETECTED NOT DETECTED Final   Listeria monocytogenes NOT DETECTED NOT DETECTED Final  Staphylococcus species DETECTED (A) NOT DETECTED Final    Comment: Methicillin (oxacillin) susceptible coagulase negative staphylococcus. Possible blood culture contaminant (unless isolated from more than one blood culture draw or clinical case suggests pathogenicity). No antibiotic treatment is indicated for blood  culture contaminants. CRITICAL RESULT CALLED TO, READ BACK BY AND VERIFIED WITH: CHARLES SHANLEVER AT 1430 ON 05/14/20 SNG    Staphylococcus aureus (BCID) NOT DETECTED NOT DETECTED Final   Methicillin resistance NOT DETECTED NOT DETECTED Final   Streptococcus species NOT DETECTED NOT DETECTED Final   Streptococcus agalactiae NOT DETECTED NOT DETECTED Final   Streptococcus pneumoniae NOT DETECTED NOT DETECTED Final    Streptococcus pyogenes NOT DETECTED NOT DETECTED Final   Acinetobacter baumannii NOT DETECTED NOT DETECTED Final   Enterobacteriaceae species NOT DETECTED NOT DETECTED Final   Enterobacter cloacae complex NOT DETECTED NOT DETECTED Final   Escherichia coli NOT DETECTED NOT DETECTED Final   Klebsiella oxytoca NOT DETECTED NOT DETECTED Final   Klebsiella pneumoniae NOT DETECTED NOT DETECTED Final   Proteus species NOT DETECTED NOT DETECTED Final   Serratia marcescens NOT DETECTED NOT DETECTED Final   Haemophilus influenzae NOT DETECTED NOT DETECTED Final   Neisseria meningitidis NOT DETECTED NOT DETECTED Final   Pseudomonas aeruginosa NOT DETECTED NOT DETECTED Final   Candida albicans NOT DETECTED NOT DETECTED Final   Candida glabrata NOT DETECTED NOT DETECTED Final   Candida krusei NOT DETECTED NOT DETECTED Final   Candida parapsilosis NOT DETECTED NOT DETECTED Final   Candida tropicalis NOT DETECTED NOT DETECTED Final    Comment: Performed at [facility], [address] [place], [place] [address]  SARS Coronavirus 2 by RT PCR (hospital order, performed in [facility] hospital lab) Nasopharyngeal Nasopharyngeal Swab     Status: None   Collection Time: 05/18/2020 11:01 AM   Specimen: Nasopharyngeal Swab  Result Value Ref Range Status   SARS Coronavirus 2 NEGATIVE NEGATIVE Final    Comment: (NOTE) SARS-CoV-2 target nucleic acids are NOT DETECTED.  The SARS-CoV-2 RNA is generally detectable in upper and lower respiratory specimens during the acute phase of infection. The lowest concentration of SARS-CoV-2 viral copies this assay can detect is 250 copies / mL. A negative result does not preclude SARS-CoV-2 infection and should not be used as the sole basis for treatment or other patient management decisions.  A negative result may occur with improper specimen collection / handling, submission of specimen other than nasopharyngeal swab, presence of viral mutation(s) within  the areas targeted by this assay, and inadequate number of viral copies (<250 copies / mL). A negative result must be combined with clinical observations, patient history, and epidemiological information.  Fact Sheet for Patients:   StrictlyIdeas.no  Fact Sheet for Healthcare Providers: BankingDealers.co.za  This test is not yet approved or  cleared by the [place] FDA and has been authorized for detection and/or diagnosis of SARS-CoV-2 by FDA under an Emergency Use Authorization (EUA).  This EUA will remain in effect (meaning this test can be used) for the duration of the COVID-19 declaration under Section 564(b)(1) of the Act, 21 U.S.C. section 360bbb-3(b)(1), unless the authorization is terminated or revoked sooner.  Performed at [facility], [address] [place], [place] [address]   MRSA PCR Screening     Status: None   Collection Time: 04/24/2020 11:33 PM   Specimen: Nasopharyngeal  Result Value Ref Range Status   MRSA by PCR NEGATIVE NEGATIVE Final    Comment:  The GeneXpert MRSA Assay (FDA approved for NASAL specimens only), is one component of a comprehensive MRSA colonization surveillance program. It is not intended to diagnose MRSA infection nor to guide or monitor treatment for MRSA infections. Performed at [facility], [address], [place], [place] [address]       Radiology Studies: DG Abd 1 View  Result Date: 05/16/2020 CLINICAL DATA:  Abdomen distension EXAM: ABDOMEN - 1 VIEW COMPARISON:  CT 05/03/2020 FINDINGS: Airspace disease at the left lung base. No unobstructed bowel gas pattern. No radiopaque calculi. IVC filter to the right of L2-L3. IMPRESSION: Nonobstructed bowel-gas pattern. Electronically Signed   By: Jasmine Pang M.D.   On: 05/16/2020 21:27     Scheduled Meds: . chlorhexidine  15 mL Mouth Rinse BID  . Chlorhexidine Gluconate Cloth  6 each Topical Daily  .  insulin aspart  0-5 Units Subcutaneous QHS  . insulin aspart  0-9 Units Subcutaneous TID WC  . mouth rinse  15 mL Mouth Rinse q12n4p   Continuous Infusions: . sodium chloride 250 mL (05/09/2020 1500)  . albumin human 12.5 g (05/17/20 1045)  . piperacillin-tazobactam (ZOSYN)  IV 100 mL/hr at 05/17/20 0600     LOS: 4 days   Time Spent in minutes   45 minutes  Jacquese Hackman D.O. on 05/17/2020 at 12:07 PM  Between 7am to 7pm - Please see pager noted on [email or web address]  After 7pm go to [email or web address]  And look for the night coverage person covering for me after hours  [facility] Office  [phone]

## 2020-05-17 NOTE — Progress Notes (Signed)
Patient not on pressors Not on oxygen Not on vent  Patient with poor prognosis    Will sign off at this time. No further recommendations at this time.  Please call [phone] for further questions. Thank you.    Lucie Leather, M.D.  [facility]  Medical Director [facility] Medical Director [facility] Cardio-Pulmonary Department

## 2020-05-18 LAB — CBC
HCT: 25 % — ABNORMAL LOW (ref 36.0–46.0)
Hemoglobin: 7.9 g/dL — ABNORMAL LOW (ref 12.0–15.0)
MCH: 28.5 pg (ref 26.0–34.0)
MCHC: 31.6 g/dL (ref 30.0–36.0)
MCV: 90.3 fL (ref 80.0–100.0)
Platelets: 247 10*3/uL (ref 150–400)
RBC: 2.77 MIL/uL — ABNORMAL LOW (ref 3.87–5.11)
RDW: 16.1 % — ABNORMAL HIGH (ref 11.5–15.5)
WBC: 9.4 10*3/uL (ref 4.0–10.5)
nRBC: 0 % (ref 0.0–0.2)

## 2020-05-18 LAB — BASIC METABOLIC PANEL
Anion gap: 14 (ref 5–15)
BUN: 79 mg/dL — ABNORMAL HIGH (ref 8–23)
CO2: 19 mmol/L — ABNORMAL LOW (ref 22–32)
Calcium: 8.5 mg/dL — ABNORMAL LOW (ref 8.9–10.3)
Chloride: 112 mmol/L — ABNORMAL HIGH (ref 98–111)
Creatinine, Ser: 1.76 mg/dL — ABNORMAL HIGH (ref 0.44–1.00)
GFR calc Af Amer: 30 mL/min — ABNORMAL LOW (ref >60)
GFR calc non Af Amer: 26 mL/min — ABNORMAL LOW (ref >60)
Glucose, Bld: 224 mg/dL — ABNORMAL HIGH (ref 70–99)
Potassium: 3.4 mmol/L — ABNORMAL LOW (ref 3.5–5.1)
Sodium: 145 mmol/L (ref 135–145)

## 2020-05-18 LAB — GLUCOSE, CAPILLARY
Glucose-Capillary: 227 mg/dL — ABNORMAL HIGH (ref 70–99)
Glucose-Capillary: 230 mg/dL — ABNORMAL HIGH (ref 70–99)
Glucose-Capillary: 234 mg/dL — ABNORMAL HIGH (ref 70–99)

## 2020-05-18 LAB — MAGNESIUM: Magnesium: 2.4 mg/dL (ref 1.7–2.4)

## 2020-05-18 MED ORDER — MORPHINE 100MG IN NS 100ML (1MG/ML) PREMIX INFUSION
1.0000 mg/h | INTRAVENOUS | Status: DC
  Administered 2020-05-18: 1 mg/h via INTRAVENOUS
  Filled 2020-05-18: qty 100

## 2020-05-18 MED ORDER — FUROSEMIDE 10 MG/ML IJ SOLN
20.0000 mg | Freq: Once | INTRAMUSCULAR | Status: AC
  Administered 2020-05-18: 20 mg via INTRAVENOUS
  Filled 2020-05-18: qty 2

## 2020-05-18 MED ORDER — POTASSIUM CHLORIDE 10 MEQ/100ML IV SOLN
10.0000 meq | INTRAVENOUS | Status: AC
  Administered 2020-05-18 (×2): 10 meq via INTRAVENOUS
  Filled 2020-05-18 (×4): qty 100

## 2020-05-18 MED ORDER — MORPHINE SULFATE (PF) 2 MG/ML IV SOLN
1.0000 mg | INTRAVENOUS | Status: DC | PRN
  Administered 2020-05-18: 2 mg via INTRAVENOUS
  Filled 2020-05-18: qty 1

## 2020-05-18 MED ORDER — MORPHINE SULFATE (PF) 2 MG/ML IV SOLN
0.5000 mg | INTRAVENOUS | Status: DC | PRN
  Administered 2020-05-18 (×2): 1 mg via INTRAVENOUS
  Filled 2020-05-18 (×2): qty 1

## 2020-05-18 MED ORDER — GLYCOPYRROLATE 0.2 MG/ML IJ SOLN
0.1000 mg | Freq: Once | INTRAMUSCULAR | Status: AC
  Administered 2020-05-18: 0.1 mg via INTRAVENOUS
  Filled 2020-05-18: qty 1

## 2020-05-18 MED ORDER — MORPHINE 100MG IN NS 100ML (1MG/ML) PREMIX INFUSION: 1.0000 mg/h | INTRAVENOUS | Status: DC

## 2020-05-18 NOTE — Progress Notes (Signed)
Pt RR in the 30's breathing is labored and she is slightly hypotensive, neice at the bedside says that she is way worse today. MD notified and plan of care discussed, morphine Q1 hour PRN ordered as long as niece is agreeable.

## 2020-05-18 NOTE — Progress Notes (Addendum)
PROGRESS NOTE    Sierra Lloyd  DEY:[id number] DOB: 14-Mar-1934 DOA: 05-18-2020 PCP: Margaretann Loveless, PA-C   Brief Narrative:  HPI On 2020-05-18 by Dr. Eileen Stanford is a 86 y.o. female with medical history significant of hypertension, diabetes mellitus, DVT-right leg (s/p of IVC filter), subdural hematoma, CKD stage III, depression with anxiety, who presents with altered mental status.  Patient has AMS, and is unable to provide accurate medical history. I spoke to her niece by phone, but she also does not know the detailed information since patient is in nursing home, therefore, most of the history is obtained by discussing the case with ED physician, per EMS report, and with the nursing staff.  Patient was recently hospitalized from 5/30-6/18 due to subdural hematoma. During the hospitalization she was also found to have a right DVT. Therefore she underwent IVC filter on 05/04/2020. Per report, pt was noted to be more confused and was unresponsive this AM. They were also concerned that patient had more peripheral swelling in her extremities.  Patient has obvious abdominal tenderness and abdominal distention on examination.  No active nausea vomiting, diarrhea noted.  No active respiratory distress and coughing noted.  Patient is barely arousable, does not follow command, slightly move extremities on painful stimuli.  Interim history Patient was admitted with severe sepsis with septic shock due to acute diverticulitis with microperforation.  She was also noted to have acute metabolic encephalopathy.  General surgery was consulted patient was not a surgical candidate and that her prognosis was very poor.  She was placed on IV fluids and antibiotics.  Given her septic shock, she was admitted to the ICU and placed on vasopressors.  Palliative care was also consulted due to patient's poor prognosis.  Currently she is DNR/DNI, however family would like full scope of care at this  time.  Assessment & Plan   Septic shock secondary to acute diverticulitis with microperforation/UTI -Present on admission -General surgery was consulted and appreciated, patient found to not be a surgical candidate and had a poor prognosis.  Recommended palliative care consult. -Given her shock, patient was admitted to the ICU and placed on vasopressors. -Blood cultures GNR, SCN -Urine culture urine culture >100K Klebsiella pneumonia -Was placed on IV fluids- however patient currently is 3rd spacing -Continue IV antibiotics-Zosyn -Procalcitonin improving, down to 7.98 (was 14.28 on 05/16/2020) -Lactic acidosis also improving-down to 1.7 (peaked to 3.2 on 6/23) -abdominal distention, possible constipation/ileus- discussed with general surgery- no good options, even IV meds (relistor) have risk for perforation  Acute kidney injury on chronic kidney disease, stage IIIB -Creatinine up to 1.76 today -Baseline creatinine approximately 0.8 -Suspect secondary to the above -Continue to monitor BMP  Acute metabolic encephalopathy -Possibly secondary to the above along with history of stroke  -CT head showed resolution of trace right frontal subdural hematoma on previous exam with residual bilateral chronic subdural hygromas in the frontal regions bilaterally -Currently patient is not responsive  Elevated troponin -Suspect secondary to demand ischemia and septic shock  DVT of the right lower extremity -Status post IVC filter on 05/04/2020 (previous admission)  Severe protein calorie malnutrition -Currently n.p.o. -Continue albumin  History of CVA and SDH -Complicating course with decreased prognosis  Hyperkalemia -Resolved  Hypokalemia -likely secondary to lasix -will replace and continue to monitor BMP -obtained magnesium level, currently 2.4  Diabetes mellitus, type II -Last hemoglobin A1c was 7.0 on 04/20/2020 -Continue insulin sliding scale with CBG  monitoring  Depression/anxiety -Home medications  held  Chronic normocytic anemia -Baseline hemoglobin approximately 8, currently 7.9 -Continue to monitor CBC  Third spacing  -likely secondary to malnutrition -continue albumin  -Was given low-dose of IV Lasix on 05/17/2020, no worsening of renal function, BP stable -Will give additional dose today continue to monitor closely  Goals of care -Palliative care consulted and appreciated  -Continue scope of care and treatment -Currently DNR/DNI  DVT Prophylaxis  SCDs  Code Status: DNR  Family Communication: None at bedside. Left message for Niece.   Disposition Plan:  Status is: Inpatient  Remains inpatient appropriate because:IV treatments appropriate due to intensity of illness or inability to take PO   Dispo: The patient is from: SNF              Anticipated d/c is to: TBD              Anticipated d/c date is: > 3 days              Patient currently is not medically stable to d/c.    Consultants PCCM General surgery  Procedures    Antibiotics   Anti-infectives (From admission, onward)   Start     Dose/Rate Route Frequency Ordered Stop   05/16/20 1800  piperacillin-tazobactam (ZOSYN) IVPB 2.25 g     Discontinue     2.25 g 100 mL/hr over 30 Minutes Intravenous Every 6 hours 05/16/20 1051     05/14/20 1000  piperacillin-tazobactam (ZOSYN) IVPB 3.375 g  Status:  Discontinued        3.375 g 12.5 mL/hr over 240 Minutes Intravenous Every 12 hours August 11, 2020 1230 August 11, 2020 1235   05/14/20 1000  piperacillin-tazobactam (ZOSYN) IVPB 3.375 g  Status:  Discontinued        3.375 g 12.5 mL/hr over 240 Minutes Intravenous Every 8 hours 05/14/20 0848 05/16/20 1051   August 11, 2020 2200  piperacillin-tazobactam (ZOSYN) IVPB 3.375 g  Status:  Discontinued        3.375 g 12.5 mL/hr over 240 Minutes Intravenous Every 12 hours August 11, 2020 1235 05/14/20 0848   August 11, 2020 1600  metroNIDAZOLE (FLAGYL) IVPB 500 mg  Status:  Discontinued        500  mg 100 mL/hr over 60 Minutes Intravenous Every 12 hours August 11, 2020 1545 August 11, 2020 1555   August 11, 2020 0945  ceFEPIme (MAXIPIME) 2 g in sodium chloride 0.9 % 100 mL IVPB        2 g 200 mL/hr over 30 Minutes Intravenous  Once August 11, 2020 0943 August 11, 2020 1100   August 11, 2020 0945  metroNIDAZOLE (FLAGYL) IVPB 500 mg        500 mg 100 mL/hr over 60 Minutes Intravenous  Once August 11, 2020 0943 August 11, 2020 1223   August 11, 2020 0945  vancomycin (VANCOCIN) IVPB 1000 mg/200 mL premix        1,000 mg 200 mL/hr over 60 Minutes Intravenous  Once August 11, 2020 [id number] August 11, 2020 1228      Subjective:   Sierra HawkingPatricia Lloyd seen and examined today.  Patient nonverbal  Objective:   Vitals:   05/18/20 0805 05/18/20 0900 05/18/20 1000 05/18/20 1100  BP:  (!) 142/54 (!) 155/77 138/65  Pulse:  85 85 84  Resp:  15 17 17   Temp: 98.6 F (37 C)     TempSrc: Oral     SpO2: 98% 97% 98% 98%  Weight:      Height:        Intake/Output Summary (Last 24 hours) at 05/18/2020 1333 Last data filed at 05/18/2020 0400 Gross per 24 hour  Intake 246.24  ml  Output 550 ml  Net -303.76 ml   Filed Weights   05/12/2020 0947 05/18/2020 2330  Weight: 50.6 kg 64.1 kg   Exam  General: Well developed, chronically-ill appearing, NAD  HEENT: NCAT, mucous membranes moist.   Neck: Supple, no JVD, no masses  Cardiovascular: S1 S2 auscultated, RRR  Respiratory: Diminished breath sounds anteriorly  Abdomen: Soft, nontender, nondistended, + bowel sounds  Extremities: warm dry without cyanosis clubbing. ++ LE/UE edema   Data Reviewed: I have personally reviewed following labs and imaging studies  CBC: Recent Labs  Lab 05/20/2020 0940 05/14/20 0234 05/15/20 0408 05/17/20 0627 05/18/20 0507  WBC 6.7 12.8* 10.3 7.7 9.4  NEUTROABS 4.9 11.6* 9.1* 6.5  --   HGB 9.4* 8.7* 8.5* 7.6* 7.9*  HCT 29.5* 28.0* 26.6* 23.2* 25.0*  MCV 92.8 93.3 91.7 89.6 90.3  PLT 385 368 263 242 093   Basic Metabolic Panel: Recent Labs  Lab 05/14/20 0234 05/15/20 0408  05/16/20 0610 05/17/20 0627 05/18/20 0507  NA 139 141 141 143 145  K 5.2* 4.7 4.3 4.0 3.4*  CL 106 109 110 113* 112*  CO2 21* 22 18* 16* 19*  GLUCOSE 212* 156* 153* 200* 224*  BUN 54* 64* 69* 74* 79*  CREATININE 1.60* 1.71* 1.69* 1.80* 1.76*  CALCIUM 8.0* 7.6* 7.9* 8.1* 8.5*  MG 2.1 2.2  --   --  2.4  PHOS 4.2 3.6  --   --   --    GFR: Estimated Creatinine Clearance: 20.4 mL/min (A) (by C-G formula based on SCr of 1.76 mg/dL (H)). Liver Function Tests: Recent Labs  Lab 05/12/2020 0940 05/14/20 0234 05/16/20 0610  AST 17 26 47*  ALT 13 13 18   ALKPHOS 62 46 61  BILITOT 1.5* 0.9 1.1  PROT 5.1* 4.9* 4.4*  ALBUMIN 1.8* 1.9* 1.6*   No results for input(s): LIPASE, AMYLASE in the last 168 hours. Recent Labs  Lab 05/17/2020 1101  AMMONIA <9*   Coagulation Profile: Recent Labs  Lab 05/11/2020 0940  INR 1.1   Cardiac Enzymes: No results for input(s): CKTOTAL, CKMB, CKMBINDEX, TROPONINI in the last 168 hours. BNP (last 3 results) No results for input(s): PROBNP in the last 8760 hours. HbA1C: No results for input(s): HGBA1C in the last 72 hours. CBG: Recent Labs  Lab 05/17/20 1139 05/17/20 1616 05/17/20 2118 05/18/20 0727 05/18/20 1110  GLUCAP 188* 181* 186* 227* 230*   Lipid Profile: No results for input(s): CHOL, HDL, LDLCALC, TRIG, CHOLHDL, LDLDIRECT in the last 72 hours. Thyroid Function Tests: No results for input(s): TSH, T4TOTAL, FREET4, T3FREE, THYROIDAB in the last 72 hours. Anemia Panel: No results for input(s): VITAMINB12, FOLATE, FERRITIN, TIBC, IRON, RETICCTPCT in the last 72 hours. Urine analysis:    Component Value Date/Time   COLORURINE AMBER (A) 05/05/2020 0940   APPEARANCEUR CLOUDY (A) 05/03/2020 0940   LABSPEC 1.026 05/16/2020 0940   PHURINE 5.0 05/07/2020 0940   GLUCOSEU NEGATIVE 05/18/2020 0940   HGBUR NEGATIVE 05/20/2020 0940   BILIRUBINUR NEGATIVE 04/26/2020 0940   KETONESUR 5 (A) 05/12/2020 0940   PROTEINUR NEGATIVE 05/21/2020 0940    NITRITE NEGATIVE 04/28/2020 0940   LEUKOCYTESUR NEGATIVE 05/17/2020 0940   Sepsis Labs: @LABRCNTIP (procalcitonin:4,lacticidven:4)  ) Recent Results (from the past 240 hour(s))  SARS Coronavirus 2 by RT PCR (hospital order, performed in [place] hospital lab) Nasopharyngeal Nasopharyngeal Swab     Status: None   Collection Time: 05/08/20  4:11 PM   Specimen: Nasopharyngeal Swab  Result Value Ref Range  Status   SARS Coronavirus 2 NEGATIVE NEGATIVE Final    Comment: (NOTE) SARS-CoV-2 target nucleic acids are NOT DETECTED.  The SARS-CoV-2 RNA is generally detectable in upper and lower respiratory specimens during the acute phase of infection. The lowest concentration of SARS-CoV-2 viral copies this assay can detect is 250 copies / mL. A negative result does not preclude SARS-CoV-2 infection and should not be used as the sole basis for treatment or other patient management decisions.  A negative result may occur with improper specimen collection / handling, submission of specimen other than nasopharyngeal swab, presence of viral mutation(s) within the areas targeted by this assay, and inadequate number of viral copies (<250 copies / mL). A negative result must be combined with clinical observations, patient history, and epidemiological information.  Fact Sheet for Patients:   BoilerBrush.com.cy  Fact Sheet for Healthcare Providers: https://pope.com/  This test is not yet approved or  cleared by the [place] FDA and has been authorized for detection and/or diagnosis of SARS-CoV-2 by FDA under an Emergency Use Authorization (EUA).  This EUA will remain in effect (meaning this test can be used) for the duration of the COVID-19 declaration under Section 564(b)(1) of the Act, 21 U.S.C. section 360bbb-3(b)(1), unless the authorization is terminated or revoked sooner.  Performed at [facility], [address]  [place], [place] [address]   Blood Culture (routine x 2)     Status: Abnormal (Preliminary result)   Collection Time: 05/07/2020  9:40 AM   Specimen: BLOOD  Result Value Ref Range Status   Specimen Description   Final    BLOOD RIGHT FA Performed at [facility], [address], [place], [place] [address]    Special Requests   Final    BOTTLES DRAWN AEROBIC AND ANAEROBIC Blood Culture results may not be optimal due to an excessive volume of blood received in culture bottles Performed at [facility], [address] [place], [place] [address]    Culture  Setup Time   Final    Organism ID to follow GRAM NEGATIVE RODS ANAEROBIC BOTTLE ONLY GRAM POSITIVE COCCI AEROBIC BOTTLE ONLY CRITICAL RESULT CALLED TO, READ BACK BY AND VERIFIED WITH: [place] SHANLEVER AT 1430 ON 05/14/20 SNG Performed at [facility], [address] [place], [place] [address]    Culture (A)  Final    STAPHYLOCOCCUS SPECIES (COAGULASE NEGATIVE) THE SIGNIFICANCE OF ISOLATING THIS ORGANISM FROM A SINGLE SET OF BLOOD CULTURES WHEN MULTIPLE SETS ARE DRAWN IS UNCERTAIN. PLEASE NOTIFY THE MICROBIOLOGY DEPARTMENT WITHIN ONE WEEK IF SPECIATION AND SENSITIVITIES ARE REQUIRED. FUSOBACTERIUM NUCLEATUM BETA LACTAMASE NEGATIVE CULTURE REINCUBATED FOR BETTER GROWTH Performed at [facility], [address], [place], [place] [address]    Report Status PENDING  Incomplete  Blood Culture (routine x 2)     Status: Abnormal (Preliminary result)   Collection Time: 04/23/2020  9:40 AM   Specimen: BLOOD  Result Value Ref Range Status   Specimen Description   Final    BLOOD RIGHT [facility] Performed at [facility], [address], [place], [place] [address]    Special Requests   Final    BOTTLES DRAWN AEROBIC AND ANAEROBIC Blood Culture adequate volume Performed at [facility], [address], [place], [place] [address]    Culture  Setup Time   Final    ANAEROBIC BOTTLE ONLY GRAM  NEGATIVE RODS CRITICAL VALUE NOTED.  VALUE IS CONSISTENT WITH PREVIOUSLY REPORTED AND CALLED VALUE. Performed at [facility], [address] [place], [place] [address]    Culture (A)  Final    ANAEROBIC GRAM NEGATIVE ROD CULTURE REINCUBATED FOR BETTER GROWTH Performed at [facility], [address], [place], [place] [address]    Report Status PENDING  Incomplete  Urine culture     Status: Abnormal   Collection Time: 2020/05/23  9:40 AM   Specimen: Urine, Random  Result Value Ref Range Status   Specimen Description   Final    URINE, RANDOM Performed at [facility], [address] [place], [place] [address]    Special Requests   Final    NONE Performed at [facility], [address] [place], [place] [address]    Culture >=100,000 COLONIES/mL KLEBSIELLA PNEUMONIAE (A)  Final   Report Status 05/15/2020 FINAL  Final   Organism ID, Bacteria KLEBSIELLA PNEUMONIAE (A)  Final      Susceptibility   Klebsiella pneumoniae - MIC*    AMPICILLIN >=32 RESISTANT Resistant     CEFAZOLIN <=4 SENSITIVE Sensitive     CEFTRIAXONE <=0.25 SENSITIVE Sensitive     CIPROFLOXACIN <=0.25 SENSITIVE Sensitive     GENTAMICIN <=1 SENSITIVE Sensitive     IMIPENEM <=0.25 SENSITIVE Sensitive     NITROFURANTOIN 64 INTERMEDIATE Intermediate     TRIMETH/SULFA <=20 SENSITIVE Sensitive     AMPICILLIN/SULBACTAM 4 SENSITIVE Sensitive     PIP/TAZO <=4 SENSITIVE Sensitive     * >=100,000 COLONIES/mL KLEBSIELLA PNEUMONIAE  Blood Culture ID Panel (Reflexed)     Status: None   Collection Time: May 23, 2020  9:40 AM  Result Value Ref Range Status   Enterococcus species NOT DETECTED NOT DETECTED Final   Listeria monocytogenes NOT DETECTED NOT DETECTED Final   Staphylococcus species NOT DETECTED NOT DETECTED Final   Staphylococcus aureus (BCID) NOT DETECTED NOT DETECTED Final   Streptococcus species NOT DETECTED NOT DETECTED Final   Streptococcus agalactiae NOT DETECTED NOT DETECTED  Final   Streptococcus pneumoniae NOT DETECTED NOT DETECTED Final   Streptococcus pyogenes NOT DETECTED NOT DETECTED Final   Acinetobacter baumannii NOT DETECTED NOT DETECTED Final   Enterobacteriaceae species NOT DETECTED NOT DETECTED Final   Enterobacter cloacae complex NOT DETECTED NOT DETECTED Final   Escherichia coli NOT DETECTED NOT DETECTED Final   Klebsiella oxytoca NOT DETECTED NOT DETECTED Final   Klebsiella pneumoniae NOT DETECTED NOT DETECTED Final   Proteus species NOT DETECTED NOT DETECTED Final   Serratia marcescens NOT DETECTED NOT DETECTED Final   Haemophilus influenzae NOT DETECTED NOT DETECTED Final   Neisseria meningitidis NOT DETECTED NOT DETECTED Final   Pseudomonas aeruginosa NOT DETECTED NOT DETECTED Final   Candida albicans NOT DETECTED NOT DETECTED Final   Candida glabrata NOT DETECTED NOT DETECTED Final   Candida krusei NOT DETECTED NOT DETECTED Final   Candida parapsilosis NOT DETECTED NOT DETECTED Final   Candida tropicalis NOT DETECTED NOT DETECTED Final    Comment: Performed at [facility], [address] [place], [place] [address]  Blood Culture ID Panel (Reflexed)     Status: Abnormal   Collection Time: May 23, 2020  9:40 AM  Result Value Ref Range Status   Enterococcus species NOT DETECTED NOT DETECTED Final   Listeria monocytogenes NOT DETECTED NOT DETECTED Final   Staphylococcus species DETECTED (A) NOT DETECTED Final    Comment: Methicillin (oxacillin) susceptible coagulase negative staphylococcus. Possible blood culture contaminant (unless isolated from more than one blood culture draw or clinical case suggests pathogenicity). No antibiotic treatment is  indicated for blood  culture contaminants. CRITICAL RESULT CALLED TO, READ BACK BY AND VERIFIED WITH: CHAR[place] SHANLEVER AT 1430 ON 05/14/20 SNG    Staphylococcus aureus (BCID) NOT DETECTED NOT DETECTED Final   Methicillin resistance NOT DETECTED NOT DETECTED Final   Streptococcus species NOT  DETECTED NOT DETECTED Final   Streptococcus agalactiae NOT DETECTED NOT DETECTED Final   Streptococcus pneumoniae NOT DETECTED NOT DETECTED Final   Streptococcus pyogenes NOT DETECTED NOT DETECTED Final   Acinetobacter baumannii NOT DETECTED NOT DETECTED Final   Enterobacteriaceae species NOT DETECTED NOT DETECTED Final   Enterobacter cloacae complex NOT DETECTED NOT DETECTED Final   Escherichia coli NOT DETECTED NOT DETECTED Final   Klebsiella oxytoca NOT DETECTED NOT DETECTED Final   Klebsiella pneumoniae NOT DETECTED NOT DETECTED Final   Proteus species NOT DETECTED NOT DETECTED Final   Serratia marcescens NOT DETECTED NOT DETECTED Final   Haemophilus influenzae NOT DETECTED NOT DETECTED Final   Neisseria meningitidis NOT DETECTED NOT DETECTED Final   Pseudomonas aeruginosa NOT DETECTED NOT DETECTED Final   Candida albicans NOT DETECTED NOT DETECTED Final   Candida glabrata NOT DETECTED NOT DETECTED Final   Candida krusei NOT DETECTED NOT DETECTED Final   Candida parapsilosis NOT DETECTED NOT DETECTED Final   Candida tropicalis NOT DETECTED NOT DETECTED Final    Comment: Performed at [facility], [address] [place], [place] [address]  SARS Coronavirus 2 by RT PCR (hospital order, performed in [facility] hospital lab) Nasopharyngeal Nasopharyngeal Swab     Status: None   Collection Time: 05/10/2020 11:01 AM   Specimen: Nasopharyngeal Swab  Result Value Ref Range Status   SARS Coronavirus 2 NEGATIVE NEGATIVE Final    Comment: (NOTE) SARS-CoV-2 target nucleic acids are NOT DETECTED.  The SARS-CoV-2 RNA is generally detectable in upper and lower respiratory specimens during the acute phase of infection. The lowest concentration of SARS-CoV-2 viral copies this assay can detect is 250 copies / mL. A negative result does not preclude SARS-CoV-2 infection and should not be used as the sole basis for treatment or other patient management decisions.  A negative result may  occur with improper specimen collection / handling, submission of specimen other than nasopharyngeal swab, presence of viral mutation(s) within the areas targeted by this assay, and inadequate number of viral copies (<250 copies / mL). A negative result must be combined with clinical observations, patient history, and epidemiological information.  Fact Sheet for Patients:   BoilerBrush.com.cy  Fact Sheet for Healthcare Providers: https://pope.com/  This test is not yet approved or  cleared by the [place] FDA and has been authorized for detection and/or diagnosis of SARS-CoV-2 by FDA under an Emergency Use Authorization (EUA).  This EUA will remain in effect (meaning this test can be used) for the duration of the COVID-19 declaration under Section 564(b)(1) of the Act, 21 U.S.C. section 360bbb-3(b)(1), unless the authorization is terminated or revoked sooner.  Performed at [facility], [address] [place], [place] [address]   MRSA PCR Screening     Status: None   Collection Time: 05/16/2020 11:33 PM   Specimen: Nasopharyngeal  Result Value Ref Range Status   MRSA by PCR NEGATIVE NEGATIVE Final    Comment:        The GeneXpert MRSA Assay (FDA approved for NASAL specimens only), is one component of a comprehensive MRSA colonization surveillance program. It is not intended to diagnose MRSA infection nor to guide or monitor treatment for MRSA infections.  Performed at [facility], [address], [place], [place] [address]       Radiology Studies: DG Abd 1 View  Result Date: 05/16/2020 CLINICAL DATA:  Abdomen distension EXAM: ABDOMEN - 1 VIEW COMPARISON:  CT 05/21/2020 FINDINGS: Airspace disease at the left lung base. No unobstructed bowel gas pattern. No radiopaque calculi. IVC filter to the right of L2-L3. IMPRESSION: Nonobstructed bowel-gas pattern. Electronically Signed   By: Jasmine Pang M.D.    On: 05/16/2020 21:27     Scheduled Meds:  chlorhexidine  15 mL Mouth Rinse BID   Chlorhexidine Gluconate Cloth  6 each Topical Daily   insulin aspart  0-5 Units Subcutaneous QHS   insulin aspart  0-9 Units Subcutaneous TID WC   mouth rinse  15 mL Mouth Rinse q12n4p   Continuous Infusions:  sodium chloride 250 mL (05/15/2020 1500)   albumin human 12.5 g (05/18/20 1031)   piperacillin-tazobactam (ZOSYN)  IV 2.25 g (05/18/20 0514)     LOS: 5 days   Time Spent in minutes   45 minutes  Sierra Lloyd D.O. on 05/18/2020 at 1:33 PM  Between 7am to 7pm - Please see pager noted on [email or web address]  After 7pm go to [email or web address]  And look for the night coverage person covering for me after hours  [facility] Office  [phone]

## 2020-05-18 NOTE — Consult Note (Signed)
Pharmacy Antibiotic Note  Sierra Lloyd is a 86 y.o. female admitted on 04/28/2020 with diverticulitis with microperforation.  Pharmacy has been consulted for pip/tazo dosing.  Today, 05/18/2020  Day 5 antibiotics  6/22 Bcx CoNS 1/4 bottles.  GNR 2/2 sets but anaerobic bottles only.FUSOBACTERIUM NUCLEATUM-anaerobe    SCr worsening with low UOP  Crcl 20.4 mlmin. Patient with lasix once on 6/26 and 6/27  Afebrile  WBC WNL  Plan:  Will continue piperacillin/tazobactam 2.25gm IV q6h but Crcl is borderline (Crcl 20.4 ml/min) for change to q8h   F/u ability to tailor antibiotics with final results  Height: 5\' 1"  (154.9 cm) Weight: 64.1 kg (141 lb 5 oz) IBW/kg (Calculated) : 47.8  Temp (24hrs), Avg:98.2 F (36.8 C), Min:97.9 F (36.6 C), Max:98.6 F (37 C)  Recent Labs  Lab 04/22/2020 0940 05/21/2020 1101 05/14/20 0234 05/14/20 0618 05/15/20 0408 05/16/20 0610 05/16/20 1104 05/16/20 1306 05/17/20 0627 05/18/20 0507  WBC 6.7  --  12.8*  --  10.3  --   --   --  7.7 9.4  CREATININE 1.52*   < > 1.60*  --  1.71* 1.69*  --   --  1.80* 1.76*  LATICACIDVEN 2.8*   < > 3.3* 2.8* 2.2*  --  1.4 1.7  --   --    < > = values in this interval not displayed.    Estimated Creatinine Clearance: 20.4 mL/min (A) (by C-G formula based on SCr of 1.76 mg/dL (H)).    Allergies  Allergen Reactions  . Tetracycline     Roof of mouth broke out.    Antimicrobials this admission: 6/22 cefepime, vancomycin, and flagyl x 1 6/22 pip/tazo >>   Dose adjustments this admission: None  Microbiology results: 6/22 BCx: GNR anaerobic bottles only,  FUSOBACTERIUM NUCLEATUM 6/22 UCx: Klebsiella  Thank you for allowing pharmacy to be a part of this patient's care.  7/22 PharmD Clinical Pharmacist 05/18/2020

## 2020-05-19 LAB — HEMOGLOBIN AND HEMATOCRIT, BLOOD
HCT: 32.8 % — ABNORMAL LOW (ref 36.0–46.0)
Hemoglobin: 9.5 g/dL — ABNORMAL LOW (ref 12.0–15.0)

## 2020-05-19 LAB — BASIC METABOLIC PANEL
Anion gap: 25 — ABNORMAL HIGH (ref 5–15)
BUN: 84 mg/dL — ABNORMAL HIGH (ref 8–23)
CO2: 8 mmol/L — ABNORMAL LOW (ref 22–32)
Calcium: 8.4 mg/dL — ABNORMAL LOW (ref 8.9–10.3)
Chloride: 113 mmol/L — ABNORMAL HIGH (ref 98–111)
Creatinine, Ser: 2.22 mg/dL — ABNORMAL HIGH (ref 0.44–1.00)
GFR calc Af Amer: 23 mL/min — ABNORMAL LOW (ref >60)
GFR calc non Af Amer: 20 mL/min — ABNORMAL LOW (ref >60)
Glucose, Bld: 149 mg/dL — ABNORMAL HIGH (ref 70–99)
Potassium: 5.6 mmol/L — ABNORMAL HIGH (ref 3.5–5.1)
Sodium: 146 mmol/L — ABNORMAL HIGH (ref 135–145)

## 2020-05-19 MED ORDER — GLYCOPYRROLATE 1 MG PO TABS
1.0000 mg | ORAL_TABLET | ORAL | Status: DC | PRN
  Filled 2020-05-19: qty 1

## 2020-05-19 MED ORDER — GLYCOPYRROLATE 0.2 MG/ML IJ SOLN: 0.2000 mg | INTRAMUSCULAR | Status: DC | PRN

## 2020-05-19 MED ORDER — GLYCOPYRROLATE 0.2 MG/ML IJ SOLN
0.2000 mg | INTRAMUSCULAR | Status: DC | PRN
  Administered 2020-05-19: 0.2 mg via INTRAVENOUS
  Filled 2020-05-19: qty 1

## 2020-05-20 LAB — CULTURE, BLOOD (ROUTINE X 2): Special Requests: ADEQUATE

## 2020-05-22 ENCOUNTER — Encounter: Payer: Self-pay | Admitting: Vascular Surgery

## 2020-05-22 NOTE — Death Summary Note (Addendum)
DEATH SUMMARY   Patient Details  Name: Sierra Lloyd MRN: [id number] DOB: 07/22/1934  Admission/Discharge Information   Admit Date:  05-30-20  Date of Death: Date of Death: 06/05/20  Time of Death: Time of Death: 0452  Length of Stay: 6  Referring Physician: Margaretann Loveless, PA-C   Reason(s) for Hospitalization  Septic shock secondary to acute diverticulitis with microperforation/UTI  Diagnoses  Preliminary cause of death:  Septic shock secondary to acute diverticulitis with microperforation/UTI Secondary Diagnoses (including complications and co-morbidities):  Acute kidney injury on chronic kidney disease, stage IIIB Severe Acute metabolic encephalopathy rendering her comatose-like  Elevated troponin DVT of the right lower extremity Severe protein calorie malnutrition History of CVA and SDH Hyperkalemia Hypokalemia Diabetes mellitus, type II Depression/anxiety Chronic normocytic anemia Third spacing- Acute on chronic diastolic heart failure with questionable pulmonary hypertension Goals of care  Brief Hospital Course (including significant findings, care, treatment, and services provided and events leading to death)  On 05-30-20 by Dr. Carol Ada a 86 y.o.femalewith medical history significant ofhypertension, diabetes mellitus, DVT-right leg(s/p of IVC filter),subdural hematoma, CKD stage III, depression with anxiety, who presents with altered mental status.  Patient has AMS,andis unable to provide accurate medical history.I spoke to her niece by phone, but shealso does not know the detailed information since patient is in nursing home,therefore, most of the history is obtained by discussing the case with ED physician, per EMS report, and with the nursing staff.  Patient was recently hospitalized from 5/30-6/18 due to subdural hematoma.During the hospitalization she was also found to have a right DVT. Therefore she underwent IVC  filter on 05/04/2020.Per report, pt was noted to be more confused and wasunresponsivethis AM.They were also concerned that patient had more peripheral swelling in her extremities.Patient has obvious abdominal tenderness and abdominal distention on examination. No active nausea vomiting, diarrhea noted. No active respiratory distress andcoughing noted. Patient is barely arousable, does not follow command, slightly move extremities on painful stimuli.  Interim history Patient was admitted with severe sepsis with septic shock due to acute diverticulitis with microperforation.  She was also noted to have acute metabolic encephalopathy.  General surgery was consulted patient was not a surgical candidate and that her prognosis was very poor.  She was placed on IV fluids and antibiotics.  Given her septic shock, she was admitted to the ICU and placed on vasopressors.  Palliative care was also consulted due to patient's poor prognosis.  Currently she is DNR/DNI, however family would like full scope of care at this time.  Patient continues to deteriorate and continue to become hypotensive.  On 05/18/2020 discussed with patient's niece, and transition patient to comfort care.  She was placed on morphine as well as Robinul for secretions.  Patient passed at 0452 on June 05, 2020  Assessment/Plan Septic shock secondary to acute diverticulitis with microperforation/UTI -Present on admission -General surgery was consulted and appreciated, patient found to not be a surgical candidate and had a poor prognosis.  Recommended palliative care consult. -Given her shock, patient was admitted to the ICU and placed on vasopressors. -Blood cultures GNR, SCN -Urine culture urine culture >100K Klebsiella pneumonia -Was placed on IV fluids- however patient was 3rd spacing -Was placed on IV antibiotics-Zosyn -Procalcitonin had improved down to 7.98 (was 14.28 on 05/16/2020) -Lactic acidosis had improved down to 1.7 (peaked to  3.2 on 6/23) -Given abdominal distention, possible constipation/ileus- discussed with general surgery- no good options, even IV meds (relistor) have risk for perforation  Acute kidney injury on chronic kidney disease, stage IIIB/ metabolic acidosis -Creatinine up to 2.22 -Baseline creatinine approximately 0.8 -Suspect secondary to the above  Acute metabolic encephalopathy -rendering her comatose like given her nonresponsiveness -Possibly secondary to the above along with history of stroke  -CT head showed resolution of trace right frontal subdural hematoma on previous exam with residual bilateral chronic subdural hygromas in the frontal regions bilaterally  Elevated troponin -Suspect secondary to demand ischemia and septic shock  DVT of the right lower extremity -Status post IVC filter on 05/04/2020 (previous admission)  Severe protein calorie malnutrition -Was NPO and placed on albumin  History of CVA and SDH -Complicating course with decreased prognosis  Hyperkalemia  Hypokalemia -likely secondary to lasix -will replace and continue to monitor BMP -obtained magnesium level,  2.4  Diabetes mellitus, type II -Last hemoglobin A1c was 7.0 on 04/20/2020 -was placed on insulin sliding scale with CBG monitoring  Depression/anxiety -Home medications held  Chronic normocytic anemia -Baseline hemoglobin approximately 8, was up to 9.5  Third spacing/ Acute diastolic CHF  -likely secondary to malnutrition -was on albumin and given low dose lasix - at that time she did not have worsening of her renal function or BP -Echocardiogram: EF 60-65%, Grade 2 diastolic dysfunction, with possible pulm hypertension  Hypernatremia  Goals of care -Palliative care consulted and appreciated  -Continue scope of care and treatment -Currently DNR/DNI -As noted above, patient continued to deteriorate and was transitioned to comfort care on 05/18/2020.  Placed on morphine for comfort  and air hunger as well as Robinul for secretions. -Patient passed earlier today at 4:52 AM  Pertinent Labs and Studies  Significant Diagnostic Studies DG Pelvis 1-2 Views  Result Date: 04/20/2020 CLINICAL DATA:  86 year old female with fall. EXAM: PELVIS - 1-2 VIEW COMPARISON:  None. FINDINGS: There is no acute fracture or dislocation. The bones are osteopenic. Mild arthritic changes of the hips. The soft tissues are unremarkable. IMPRESSION: No acute fracture or dislocation. Electronically Signed   By: Elgie Collard M.D.   On: 04/20/2020 23:00   DG Knee 1-2 Views Right  Result Date: 04/21/2020 CLINICAL DATA:  Patient found down.  Bruising to right hip area. EXAM: RIGHT KNEE - 1-2 VIEW COMPARISON:  None. FINDINGS: While this is described as a right knee film, images are instead obtained of most of the femur. The right femoral head evaluation is limited due to positioning. Evaluation of the proximal tibia and fibula is limited as well. No fractures are seen within the femur. The proximal tibia and fibula are normal without fracture within visualize limits. Tricompartmental degenerative changes are seen. No joint effusion. IMPRESSION: This is an unusual study which consists of images of most of the femur. Evaluation of the hip and the knee are both limited due to positioning of the images. Within visualized limits, no femoral fractures are identified. No joint effusion in the knee. Limited views of the proximal tibia and fibula demonstrate no obvious fracture. Tricompartmental degenerative changes are identified. If there is concern for a hip fracture, dedicated images should be obtained. If there is concern for fracture in the knee, dedicated images of the knee should be obtained. Electronically Signed   By: Gerome Sam III M.D   On: 04/21/2020 09:47   DG Abd 1 View  Result Date: 05/16/2020 CLINICAL DATA:  Abdomen distension EXAM: ABDOMEN - 1 VIEW COMPARISON:  CT 05/20/2020 FINDINGS: Airspace  disease at the left lung base. No unobstructed bowel gas pattern. No radiopaque  calculi. IVC filter to the right of L2-L3. IMPRESSION: Nonobstructed bowel-gas pattern. Electronically Signed   By: Jasmine Pang M.D.   On: 05/16/2020 21:27   CT Head Wo Contrast  Result Date: 05/02/2020 CLINICAL DATA:  Altered mental status of unclear cause EXAM: CT HEAD WITHOUT CONTRAST TECHNIQUE: Contiguous axial images were obtained from the base of the skull through the vertex without intravenous contrast. COMPARISON:  04/22/2020 FINDINGS: Brain: Generalized atrophy. Normal ventricular morphology. No midline shift or mass effect. Extensive small vessel chronic ischemic changes of deep cerebral white matter. Old infarcts at the deep RIGHT frontal white matter, basal ganglia and BILATERAL cerebellar hemispheres. Few scattered streak artifacts. Resolution of trace RIGHT frontal subdural hematoma seen on previous exam. 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 No intracranial hemorrhage, mass lesion or evidence of acute infarction. Vascular: Atherosclerotic calcification of internal carotid and vertebral arteries at skull base Skull: Intact Sinuses/Orbits: Partial opacification of LEFT mastoid air cells, new. Remaining visualized paranasal sinuses and RIGHT mastoid air cells clear. Other: N/A IMPRESSION: Atrophy with small vessel chronic ischemic changes of deep cerebral white matter. Resolution of trace RIGHT frontal subdural hematoma seen on previous exam with residual small BILATERAL chronic subdural hygromas in the frontal regions bilaterally. 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 No new intracranial abnormalities. Electronically Signed   By: Ulyses Southward M.D.   On: 05/06/2020 11:23   CT Head Wo Contrast  Result Date: 04/22/2020 CLINICAL DATA:  Mental status change, follow-up subdural hematoma EXAM: CT HEAD WITHOUT CONTRAST TECHNIQUE:  Contiguous axial images were obtained from the base of the skull through the vertex without intravenous contrast. COMPARISON:  04/21/2020, 10:34 a.m., 04/21/2020, 5:36 a.m. FINDINGS: Brain: Redemonstrated trace acute right frontal subdural hemorrhage is not significantly changed. There are possible small underlying bilateral chronic bifrontal hygromas versus prominent subdural spaces secondary to global volume loss. No hydrocephalus. No midline shift. Periventricular and deep white matter hypodensity. Vascular: No hyperdense vessel or unexpected calcification. Skull: Normal. Negative for fracture or focal lesion. Sinuses/Orbits: No acute finding. Other: Redemonstrated scalp hematoma of the right forehead. IMPRESSION: 1. Redemonstrated trace acute right frontal subdural hemorrhage is not significantly changed. No significant mass effect or midline shift. 2. There are possible small underlying bilateral chronic bifrontal hygromas versus prominent subdural spaces secondary to global volume loss. 3. Redemonstrated scalp hematoma of the right forehead. 4. Small-vessel white matter disease. Electronically Signed   By: Lauralyn Primes M.D.   On: 04/22/2020 11:44   CT HEAD WO CONTRAST  Result Date: 04/21/2020 CLINICAL DATA:  Follow-up subdural hematoma seen earlier this morning. EXAM: CT HEAD WITHOUT CONTRAST TECHNIQUE: Contiguous axial images were obtained from the base of the skull through the vertex without intravenous contrast. COMPARISON:  Apr 21, 2020 FINDINGS: Brain: The small amount of subdural blood adjacent to the right frontal parietal region is stable without mass effect. No midline shift. No new subdural or epidural hemorrhage. No subarachnoid hemorrhage. A lacunar infarct in the right cerebellum is stable. No acute cerebellar abnormalities. The brainstem and basal cisterns are normal. The sulci are prominent but stable. The ventricles are normal. Scattered white matter changes are identified. No acute  cortical ischemia or infarct. Vascular: Calcified atherosclerosis in the intracranial carotids. Skull: Normal. Negative for fracture or focal lesion. Sinuses/Orbits: No acute finding. Other: Soft tissue swelling over the right lateral scalp. Extracranial soft tissues otherwise normal. IMPRESSION: 1. The small amount of subdural hemorrhage on the right is stable.  No mass effect. No midline shift. 2. Chronic white matter changes. 3. No other acute abnormalities. Electronically Signed   By: Gerome Sam III M.D   On: 04/21/2020 10:48   CT Head Wo Contrast  Result Date: 04/21/2020 CLINICAL DATA:  Fall out of bed in the urgency department EXAM: CT HEAD WITHOUT CONTRAST TECHNIQUE: Contiguous axial images were obtained from the base of the skull through the vertex without intravenous contrast. COMPARISON:  Apr 21, 2020 4:37 a.m. FINDINGS: Brain: Again noted is a small amount of mixed hyperdense subdural hemorrhage seen overlying the right frontal lobe. No new extra-axial collections are seen. There is dilatation the ventricles and sulci consistent with age-related atrophy. Low-attenuation changes in the deep white matter consistent with small vessel ischemia. Vascular: No hyperdense vessel or unexpected calcification. Skull: The skull is intact. No fracture or focal lesion identified. Sinuses/Orbits: The visualized paranasal sinuses and mastoid air cells are clear. The orbits and globes intact. Other: Soft tissue hematoma again noted over the right frontal skull. Cervical spine: Alignment: There is straightening of the normal cervical lordosis. Skull base and vertebrae: Visualized skull base is intact. No atlanto-occipital dissociation. The vertebral body heights are well maintained. No fracture or pathologic osseous lesion seen. Soft tissues and spinal canal: The visualized paraspinal soft tissues are unremarkable. No prevertebral soft tissue swelling is seen. The spinal canal is grossly unremarkable, no large  epidural collection or significant canal narrowing. Disc levels: Multilevel cervical spine spondylosis is seen with large anterior osteophytes, disc osteophyte complex and uncovertebral osteophytes most notable at C3 through C6 with severe neural foraminal narrowing and moderate central canal stenosis. Upper chest: The lung apices are clear. Thoracic inlet is within normal limits. Other: None IMPRESSION: No significant change in the small amount of mixed density subdural hemorrhage overlying the right frontal lobe. Findings consistent with age related atrophy and chronic small vessel ischemia No acute fracture or malalignment of the spine. Multilevel cervical spine spondylosis most notable from C3 through C6. Electronically Signed   By: Jonna Clark M.D.   On: 04/21/2020 05:51   CT Head Wo Contrast  Result Date: 04/21/2020 CLINICAL DATA:  Follow-up of subdural hematoma EXAM: CT HEAD WITHOUT CONTRAST TECHNIQUE: Contiguous axial images were obtained from the base of the skull through the vertex without intravenous contrast. COMPARISON:  04/20/2020 FINDINGS: Brain: Small amount of right convexity mixed subdural and subarachnoid blood is unchanged. Unchanged prominence of the extra-axial CSF spaces. There is periventricular hypoattenuation compatible with chronic microvascular disease. Vascular: Atherosclerotic calcification of the vertebral and internal carotid arteries at the skull base. No abnormal hyperdensity of the major intracranial arteries or dural venous sinuses. Skull: Right parietal scalp hematoma.  No skull fracture. Sinuses/Orbits: No fluid levels or advanced mucosal thickening of the visualized paranasal sinuses. No mastoid or middle ear effusion. The orbits are normal. IMPRESSION: 1. Unchanged small amount of right convexity mixed subdural and subarachnoid blood. 2. Right parietal scalp hematoma without skull fracture. Electronically Signed   By: Deatra Robinson M.D.   On: 04/21/2020 04:37   CT Head  Wo Contrast  Result Date: 04/20/2020 CLINICAL DATA:  Head trauma, fall EXAM: CT HEAD WITHOUT CONTRAST TECHNIQUE: Contiguous axial images were obtained from the base of the skull through the vertex without intravenous contrast. COMPARISON:  January 19, 2019 FINDINGS: Brain: There is a tiny amount of mixed hyperdense subdural overlying the right frontal lobe measuring approximately 4mm in maximum diameter best seen on series 2, image 10. No midline shift is  seen. There is dilatation the ventricles and sulci consistent with age-related atrophy. Low-attenuation changes in the deep white matter consistent with small vessel ischemia. Vascular: No hyperdense vessel or unexpected calcification. Skull: The skull is intact. No fracture or focal lesion identified. Sinuses/Orbits: The visualized paranasal sinuses and mastoid air cells are clear. The orbits and globes intact. Other: There is a soft tissue hematoma seen overlying the right frontal skull measuring 3 cm in maximum diameter. IMPRESSION: Tiny amount of mixed density subdural hemorrhage overlying the right frontal lobe measuring 4 mm in maximum diameter. No midline shift. Findings consistent with age related atrophy and chronic small vessel ischemia Soft tissue hematoma overlying the right frontal skull. Electronically Signed   By: Jonna Clark M.D.   On: 04/20/2020 22:51   CT Angio Chest PE W and/or Wo Contrast  Result Date: 05/14/2020 CLINICAL DATA:  Significant shortness of breath, history of known deep venous thrombosis and recent filter placement EXAM: CT ANGIOGRAPHY CHEST CT ABDOMEN AND PELVIS WITH CONTRAST TECHNIQUE: Multidetector CT imaging of the chest was performed using the standard protocol during bolus administration of intravenous contrast. Multiplanar CT image reconstructions and MIPs were obtained to evaluate the vascular anatomy. Multidetector CT imaging of the abdomen and pelvis was performed using the standard protocol during bolus  administration of intravenous contrast. CONTRAST:  60mL OMNIPAQUE IOHEXOL 350 MG/ML SOLN COMPARISON:  05/04/2019 FINDINGS: CTA CHEST FINDINGS Cardiovascular: Thoracic aorta demonstrates mild atherosclerotic calcification without aneurysmal dilatation or dissection. Cardiac enlargement is noted. The pulmonary artery shows a normal branching pattern bilaterally. No sizable filling defects to suggest pulmonary emboli are seen. Coronary calcifications are noted. No sizable pericardial effusion is seen. Mediastinum/Nodes: Thoracic inlet is within normal limits. No sizable hilar or mediastinal adenopathy is noted. The esophagus is within normal limits. Lungs/Pleura: Small bilateral pleural effusions are noted right greater than left with mild associated compressive atelectasis again right greater than left. The lungs are otherwise well aerated without focal infiltrate. No sizable parenchymal nodules are seen. No pneumothoraces are noted. Musculoskeletal: Degenerative changes of the thoracic spine are noted. No acute bony abnormality is seen. Review of the MIP images confirms the above findings. CT ABDOMEN and PELVIS FINDINGS Hepatobiliary: Liver is well visualized without focal mass lesion. The gallbladder is well distended although no gallstones are noted. Pancreas: Unremarkable. No pancreatic ductal dilatation or surrounding inflammatory changes. Spleen: Normal in size without focal abnormality. Adrenals/Urinary Tract: Adrenal glands are within normal limits. Kidneys demonstrate a normal enhancement pattern. No renal calculi or urinary tract obstructive changes are seen. Small left renal cyst is noted. The bladder is well distended. Small focus of air is noted within the bladder consistent with recent instrumentation. Stomach/Bowel: Appendix is well visualized and within normal limits. Fecal material is noted throughout the colon. The sigmoid colon is distended with changes of diverticular disease as well as focal wall  thickening consistent with focal diverticulitis. Large stool burden in the sigmoid is noted which may be related to a degree of impaction. Multiple extraluminal foci of air are noted consistent with mild perforation likely related to this focal inflammatory change. Small bowel appears within normal limits with the exception of some compensatory inflammatory change related to the sigmoid changes. Stomach is unremarkable. Vascular/Lymphatic: Atherosclerotic calcifications are noted. IVC filter is noted in place. Right-sided DVT is again noted and stable. No definitive thrombus within the IVC filter is seen. No significant lymphadenopathy is noted. Reproductive: Uterus and bilateral adnexa are unremarkable. Other: Minimal free fluid is noted related  to the perforation. Minimal changes of anasarca are noted. Musculoskeletal: Degenerative changes of lumbar spine are again seen. Stable hematoma over the right hip is noted. Review of the MIP images confirms the above findings. IMPRESSION: CTA of the chest: No evidence of pulmonary embolism. Mild bibasilar atelectasis and small effusions right greater than left. CT of the abdomen and pelvis: Changes of diverticulitis in the sigmoid colon with a large focal stool burden which may represent some impaction. Associated inflammatory changes are noted as well as findings of microperforations. Small amount of free fluid is noted as well. Distended gallbladder of uncertain significance. This is stable from the prior exam. Stable hematoma over the right hip. Electronically Signed   By: Inez Catalina M.D.   On: 05-16-20 11:42   CT Cervical Spine Wo Contrast  Result Date: 04/21/2020 CLINICAL DATA:  Fall out of bed in the urgency department EXAM: CT HEAD WITHOUT CONTRAST TECHNIQUE: Contiguous axial images were obtained from the base of the skull through the vertex without intravenous contrast. COMPARISON:  Apr 21, 2020 4:37 a.m. FINDINGS: Brain: Again noted is a small amount of  mixed hyperdense subdural hemorrhage seen overlying the right frontal lobe. No new extra-axial collections are seen. There is dilatation the ventricles and sulci consistent with age-related atrophy. Low-attenuation changes in the deep white matter consistent with small vessel ischemia. Vascular: No hyperdense vessel or unexpected calcification. Skull: The skull is intact. No fracture or focal lesion identified. Sinuses/Orbits: The visualized paranasal sinuses and mastoid air cells are clear. The orbits and globes intact. Other: Soft tissue hematoma again noted over the right frontal skull. Cervical spine: Alignment: There is straightening of the normal cervical lordosis. Skull base and vertebrae: Visualized skull base is intact. No atlanto-occipital dissociation. The vertebral body heights are well maintained. No fracture or pathologic osseous lesion seen. Soft tissues and spinal canal: The visualized paraspinal soft tissues are unremarkable. No prevertebral soft tissue swelling is seen. The spinal canal is grossly unremarkable, no large epidural collection or significant canal narrowing. Disc levels: Multilevel cervical spine spondylosis is seen with large anterior osteophytes, disc osteophyte complex and uncovertebral osteophytes most notable at C3 through C6 with severe neural foraminal narrowing and moderate central canal stenosis. Upper chest: The lung apices are clear. Thoracic inlet is within normal limits. Other: None IMPRESSION: No significant change in the small amount of mixed density subdural hemorrhage overlying the right frontal lobe. Findings consistent with age related atrophy and chronic small vessel ischemia No acute fracture or malalignment of the spine. Multilevel cervical spine spondylosis most notable from C3 through C6. Electronically Signed   By: Prudencio Pair M.D.   On: 04/21/2020 05:51   CT PELVIS WO CONTRAST  Result Date: 04/24/2020 CLINICAL DATA:  86 year old female with fall and hip  pain. EXAM: CT PELVIS WITHOUT CONTRAST TECHNIQUE: Multidetector CT imaging of the pelvis was performed following the standard protocol without intravenous contrast. COMPARISON:  Hip radiograph dated 04/21/2020. FINDINGS: Urinary Tract:  No abnormality visualized. Bowel:  There is sigmoid diverticulosis. Vascular/Lymphatic: Advanced atherosclerotic calcification of the aorta and iliac arteries. No adenopathy within the pelvis. Reproductive:  The uterus and ovaries are grossly unremarkable. Other: Right hip soft tissue contusion and partially visualized hematoma. A 3 cm cystic structure in the soft tissues of the right groin is not well characterized but may represent a sebaceous cyst. This can be better evaluated with ultrasound. Musculoskeletal: Osteopenia. Degenerative changes of the lower lumbar spine. No acute osseous pathology. IMPRESSION: 1. No acute fracture  or dislocation. 2. Right hip soft tissue contusion and partially visualized hematoma. 3. Sigmoid diverticulosis. 4. Aortic Atherosclerosis (ICD10-I70.0). Electronically Signed   By: Arash  RadparvarElgie Collard M.D.   On: 04/24/2020 16:13   US PELVIS LIMITED (TRANSABDOMINAL ONLY)  Result Date: 04/26/2020 CLINICAL DATA:  Assess hematoma of right labium majus. Patient fell. Hip pain. EXAM: LIMITED ULTRASOUND OF PELVIS TECHNIQUE: Limited transabdominal ultrasound examination of the pelvis was performed. COMPARISON:  CT of the pelvis on 04/24/2020 FINDINGS: A circumscribed oval hypoechoic heterogeneous mass is identified, labeled "RIGHT pelvic". There is no blood flow within the mass. Mass measures 5.0 x 2.8 x 2.9 centimeters. IMPRESSION: Stable appearance of RIGHT labium majus mass, measuring 5.0 centimeters. Although an organized hematoma could have this appearance, the circumscribed margins and localized process suggest a nontraumatic process. Considerations include epidermal inclusion cyst, sebaceous gland cyst, Bartholin duct cyst, or Skene duct cyst.  Electronically Signed   By: Norva PavlovElizabeth  Brown M.D.   On: 04/26/2020 14:56   CT ABDOMEN PELVIS W CONTRAST  Result Date: 05/20/2020 CLINICAL DATA:  Significant shortness of breath, history of known deep venous thrombosis and recent filter placement EXAM: CT ANGIOGRAPHY CHEST CT ABDOMEN AND PELVIS WITH CONTRAST TECHNIQUE: Multidetector CT imaging of the chest was performed using the standard protocol during bolus administration of intravenous contrast. Multiplanar CT image reconstructions and MIPs were obtained to evaluate the vascular anatomy. Multidetector CT imaging of the abdomen and pelvis was performed using the standard protocol during bolus administration of intravenous contrast. CONTRAST:  60mL OMNIPAQUE IOHEXOL 350 MG/ML SOLN COMPARISON:  05/04/2019 FINDINGS: CTA CHEST FINDINGS Cardiovascular: Thoracic aorta demonstrates mild atherosclerotic calcification without aneurysmal dilatation or dissection. Cardiac enlargement is noted. The pulmonary artery shows a normal branching pattern bilaterally. No sizable filling defects to suggest pulmonary emboli are seen. Coronary calcifications are noted. No sizable pericardial effusion is seen. Mediastinum/Nodes: Thoracic inlet is within normal limits. No sizable hilar or mediastinal adenopathy is noted. The esophagus is within normal limits. Lungs/Pleura: Small bilateral pleural effusions are noted right greater than left with mild associated compressive atelectasis again right greater than left. The lungs are otherwise well aerated without focal infiltrate. No sizable parenchymal nodules are seen. No pneumothoraces are noted. Musculoskeletal: Degenerative changes of the thoracic spine are noted. No acute bony abnormality is seen. Review of the MIP images confirms the above findings. CT ABDOMEN and PELVIS FINDINGS Hepatobiliary: Liver is well visualized without focal mass lesion. The gallbladder is well distended although no gallstones are noted. Pancreas:  Unremarkable. No pancreatic ductal dilatation or surrounding inflammatory changes. Spleen: Normal in size without focal abnormality. Adrenals/Urinary Tract: Adrenal glands are within normal limits. Kidneys demonstrate a normal enhancement pattern. No renal calculi or urinary tract obstructive changes are seen. Small left renal cyst is noted. The bladder is well distended. Small focus of air is noted within the bladder consistent with recent instrumentation. Stomach/Bowel: Appendix is well visualized and within normal limits. Fecal material is noted throughout the colon. The sigmoid colon is distended with changes of diverticular disease as well as focal wall thickening consistent with focal diverticulitis. Large stool burden in the sigmoid is noted which may be related to a degree of impaction. Multiple extraluminal foci of air are noted consistent with mild perforation likely related to this focal inflammatory change. Small bowel appears within normal limits with the exception of some compensatory inflammatory change related to the sigmoid changes. Stomach is unremarkable. Vascular/Lymphatic: Atherosclerotic calcifications are noted. IVC filter is noted in place. Right-sided DVT is again  noted and stable. No definitive thrombus within the IVC filter is seen. No significant lymphadenopathy is noted. Reproductive: Uterus and bilateral adnexa are unremarkable. Other: Minimal free fluid is noted related to the perforation. Minimal changes of anasarca are noted. Musculoskeletal: Degenerative changes of lumbar spine are again seen. Stable hematoma over the right hip is noted. Review of the MIP images confirms the above findings. IMPRESSION: CTA of the chest: No evidence of pulmonary embolism. Mild bibasilar atelectasis and small effusions right greater than left. CT of the abdomen and pelvis: Changes of diverticulitis in the sigmoid colon with a large focal stool burden which may represent some impaction. Associated  inflammatory changes are noted as well as findings of microperforations. Small amount of free fluid is noted as well. Distended gallbladder of uncertain significance. This is stable from the prior exam. Stable hematoma over the right hip. Electronically Signed   By: Alcide Clever M.D.   On: 06/03/2020 11:42   US Venous Img Lower Bilateral (DVT)  Result Date: 05/03/2020 CLINICAL DATA:  Bilateral lower extremity pain and edema. EXAM: BILATERAL LOWER EXTREMITY VENOUS DOPPLER ULTRASOUND TECHNIQUE: Gray-scale sonography with graded compression, as well as color Doppler and duplex ultrasound were performed to evaluate the lower extremity deep venous systems from the level of the common femoral vein and including the common femoral, femoral, profunda femoral, popliteal and calf veins including the posterior tibial, peroneal and gastrocnemius veins when visible. The superficial great saphenous vein was also interrogated. Spectral Doppler was utilized to evaluate flow at rest and with distal augmentation maneuvers in the common femoral, femoral and popliteal veins. COMPARISON:  None. FINDINGS: RIGHT LOWER EXTREMITY Common Femoral Vein: No evidence of thrombus. Normal compressibility, respiratory phasicity and response to augmentation. Saphenofemoral Junction: No evidence of thrombus. Normal compressibility and flow on color Doppler imaging. Profunda Femoral Vein: Nonocclusive thrombus identified in the profunda femoral vein on the right. Femoral Vein: No evidence of thrombus. Normal compressibility, respiratory phasicity and response to augmentation. Popliteal Vein: No evidence of thrombus. Normal compressibility, respiratory phasicity and response to augmentation. Calf Veins: No evidence of thrombus. Normal compressibility and flow on color Doppler imaging. Superficial Great Saphenous Vein: No evidence of thrombus. Normal compressibility. Venous Reflux:  None. Other Findings: No evidence of superficial thrombophlebitis  or abnormal fluid collection. LEFT LOWER EXTREMITY Common Femoral Vein: No evidence of thrombus. Normal compressibility, respiratory phasicity and response to augmentation. Saphenofemoral Junction: No evidence of thrombus. Normal compressibility and flow on color Doppler imaging. Profunda Femoral Vein: No evidence of thrombus. Normal compressibility and flow on color Doppler imaging. Femoral Vein: No evidence of thrombus. Normal compressibility, respiratory phasicity and response to augmentation. Popliteal Vein: No evidence of thrombus. Normal compressibility, respiratory phasicity and response to augmentation. Calf Veins: No evidence of thrombus. Normal compressibility and flow on color Doppler imaging. Superficial Great Saphenous Vein: No evidence of thrombus. Normal compressibility. Venous Reflux:  None. Other Findings: No evidence of superficial thrombophlebitis or abnormal fluid collection. IMPRESSION: DVT isolated to the profunda femoral vein on the right with nonocclusive thrombus identified. No other thrombus is identified bilaterally. Electronically Signed   By: Irish Lack M.D.   On: 05/03/2020 13:36   DG Chest Port 1 View  Result Date: 05/14/2020 CLINICAL DATA:  86 year old female with malnutrition. Abnormal chest radiograph. EXAM: PORTABLE CHEST 1 VIEW COMPARISON:  Chest radiograph dated 2020-06-03 and CT dated Jun 03, 2020. FINDINGS: Small bilateral pleural effusions with bibasilar atelectasis or infiltrate, right greater left. No pneumothorax. Stable cardiomegaly. Atherosclerotic calcification of the aorta.  No acute osseous pathology. IMPRESSION: Small bilateral pleural effusions with bibasilar atelectasis or infiltrate, right greater than left. Electronically Signed   By: Elgie Collard M.D.   On: 05/14/2020 18:35   DG Chest Portable 1 View  Result Date: 06-04-20 CLINICAL DATA:  Severe shortness of breath EXAM: PORTABLE CHEST 1 VIEW COMPARISON:  Portable exam 0956 hours compared to  04/20/2020 FINDINGS: Enlargement of cardiac silhouette. Mediastinal contours and pulmonary vascularity normal. Atherosclerotic calcification aorta. Minimal bibasilar opacities question atelectasis versus early infiltrate. Remaining lungs clear. No pleural effusion or pneumothorax. Bones demineralized. IMPRESSION: Enlargement of cardiac silhouette. Minimal bibasilar opacities question atelectasis versus infiltrate. Electronically Signed   By: Ulyses Southward M.D.   On: 06/04/2020 10:33   DG Chest Portable 1 View  Result Date: 04/20/2020 CLINICAL DATA:  Weakness EXAM: PORTABLE CHEST 1 VIEW COMPARISON:  January 19, 2019 FINDINGS: The heart size and mediastinal contours are within normal limits. Aortic knob calcifications are seen. Both lungs are clear. The visualized skeletal structures are unremarkable. IMPRESSION: No active disease. Electronically Signed   By: Jonna Clark M.D.   On: 04/20/2020 22:22   DG Humerus Right  Result Date: 05/02/2020 CLINICAL DATA:  Recent fall with right arm pain, initial encounter EXAM: RIGHT HUMERUS - 2+ VIEW COMPARISON:  None. FINDINGS: Degenerative changes of the acromioclavicular joint and glenohumeral joint are seen. No acute fracture or dislocation is noted. The humeral head is somewhat high-riding likely related to underlying rotator cuff injury. Soft tissue swelling is noted distally likely related to the recent injury. IMPRESSION: Soft tissue swelling distally consistent with the recent injury. No evidence of acute fracture. Findings suggestive of chronic rotator cuff injury. Electronically Signed   By: Alcide Clever M.D.   On: 05/02/2020 20:52   ECHOCARDIOGRAM COMPLETE  Result Date: 05/14/2020    ECHOCARDIOGRAM REPORT   Patient Name:   WAI MINOTTI Heiner Date of Exam: 05/14/2020 Medical Rec #:  [id number]        Height:       61.0 in Accession #:    [id number]       Weight:       141.3 lb Date of Birth:  05-24-1934         BSA:          1.630 m Patient Age:    86 years          BP:           89/55 mmHg Patient Gender: F                HR:           66 bpm. Exam Location:  [facility] Procedure: 2D Echo, Cardiac Doppler and Color Doppler Indications:     CHF- acute systolic 428.21  History:         Patient has no prior history of Echocardiogram examinations.                  Risk Factors:Hypertension and Diabetes.  Sonographer:     Cristela Blue RDCS (AE) Referring Phys:  [id number] Vida Rigger Diagnosing Phys: Alwyn Pea MD IMPRESSIONS  1. Left ventricular ejection fraction, by estimation, is 60 to 65%. The left ventricle has normal function. The left ventricle has no regional wall motion abnormalities. The left ventricular internal cavity size was moderately dilated. Left ventricular diastolic parameters are consistent with Grade II diastolic dysfunction (pseudonormalization).  2. Right ventricular systolic function is moderately reduced. The right ventricular size is moderately  enlarged. There is moderately elevated pulmonary artery systolic pressure.  3. Left atrial size was moderately dilated.  4. Right atrial size was mild to moderately dilated.  5. The mitral valve is grossly normal. Mild to moderate mitral valve regurgitation.  6. The aortic valve is grossly normal. Aortic valve regurgitation is not visualized. FINDINGS  Left Ventricle: Left ventricular ejection fraction, by estimation, is 60 to 65%. The left ventricle has normal function. The left ventricle has no regional wall motion abnormalities. The left ventricular internal cavity size was moderately dilated. There is no left ventricular hypertrophy. Left ventricular diastolic parameters are consistent with Grade II diastolic dysfunction (pseudonormalization). Right Ventricle: The right ventricular size is moderately enlarged. No increase in right ventricular wall thickness. Right ventricular systolic function is moderately reduced. There is moderately elevated pulmonary artery systolic pressure. The tricuspid  regurgitant  velocity is 3.26 m/s, and with an assumed right atrial pressure of 10 mmHg, the estimated right ventricular systolic pressure is 52.5 mmHg. Left Atrium: Left atrial size was moderately dilated. Right Atrium: Right atrial size was mild to moderately dilated. Pericardium: There is no evidence of pericardial effusion. Mitral Valve: The mitral valve is grossly normal. Mild to moderate mitral valve regurgitation. Tricuspid Valve: The tricuspid valve is normal in structure. Tricuspid valve regurgitation is mild. Aortic Valve: The aortic valve is grossly normal. Aortic valve regurgitation is not visualized. Aortic valve mean gradient measures 3.0 mmHg. Aortic valve peak gradient measures 4.8 mmHg. Aortic valve area, by VTI measures 2.14 cm. Pulmonic Valve: The pulmonic valve was normal in structure. Pulmonic valve regurgitation is not visualized. Aorta: The aortic root is normal in size and structure. IAS/Shunts: No atrial level shunt detected by color flow Doppler.  LEFT VENTRICLE PLAX 2D LVIDd:         5.10 cm  Diastology LVIDs:         3.40 cm  LV e' lateral:   8.27 cm/s LV PW:         0.77 cm  LV E/e' lateral: 11.2 LV IVS:        0.69 cm  LV e' medial:    6.85 cm/s LVOT diam:     2.00 cm  LV E/e' medial:  13.6 LV SV:         48 LV SV Index:   30 LVOT Area:     3.14 cm  RIGHT VENTRICLE RV Basal diam:  3.84 cm RV S prime:     11.70 cm/s TAPSE (M-mode): 4.1 cm LEFT ATRIUM             Index       RIGHT ATRIUM           Index LA diam:        3.60 cm 2.21 cm/m  RA Area:     13.70 cm LA Vol (A2C):   87.0 ml 53.39 ml/m RA Volume:   31.30 ml  19.21 ml/m LA Vol (A4C):   55.3 ml 33.94 ml/m LA Biplane Vol: 69.1 ml 42.40 ml/m  AORTIC VALVE AV Area (Vmax):    1.77 cm AV Area (Vmean):   2.01 cm AV Area (VTI):     2.14 cm AV Vmax:           110.00 cm/s AV Vmean:          75.450 cm/s AV VTI:            0.226 m AV Peak Grad:      4.8 mmHg AV  Mean Grad:      3.0 mmHg LVOT Vmax:         62.00 cm/s LVOT Vmean:        48.200 cm/s  LVOT VTI:          0.154 m LVOT/AV VTI ratio: 0.68  AORTA Ao Root diam: 2.70 cm MITRAL VALVE               TRICUSPID VALVE MV Area (PHT): 5.38 cm    TR Peak grad:   42.5 mmHg MV Decel Time: 141 msec    TR Vmax:        326.00 cm/s MV E velocity: 93.00 cm/s MV A velocity: 61.70 cm/s  SHUNTS MV E/A ratio:  1.51        Systemic VTI:  0.15 m                            Systemic Diam: 2.00 cm Alwyn Pea MD Electronically signed by Alwyn Pea MD Signature Date/Time: 05/14/2020/4:21:43 PM    Final    CT VENOGRAM ABD/PEL  Result Date: 05/03/2020 CLINICAL DATA:  Lower extremity DVT. EXAM: CT VENOGRAM OF THE ABDOMEN AND PELVIS. TECHNIQUE: Multidetector CT imaging of the ABDOMEN AND PELVIS was performed using the standard protocol during bolus administration of intravenous contrast. Multiplanar CT image reconstructions and MIPs were obtained to evaluate the vascular anatomy. CONTRAST:  OMNIPAQUE IOHEXOL 350 MG/ML SOLN COMPARISON:  None. FINDINGS: VASCULAR Aorta: There are atherosclerotic changes of the abdominal aorta without evidence for an abdominal aortic aneurysm. Celiac: Patent without evidence of aneurysm, dissection, vasculitis or significant stenosis. SMA: Patent without evidence of aneurysm, dissection, vasculitis or significant stenosis. Renals: There are atherosclerotic changes at the origin of both renal arteries without evidence for a high-grade stenosis. IMA: Patent without evidence of aneurysm, dissection, vasculitis or significant stenosis. Veins: There is a partially visualized right common femoral vein DVT. There is no DVT identified within the IVC or iliac vasculature. Review of the MIP images confirms the above findings. NON-VASCULAR Lower chest: There are small bilateral pleural effusions.The heart is enlarged. Hepatobiliary: The liver is normal. Normal gallbladder.There is no biliary ductal dilation. Pancreas: Normal contours without ductal dilatation. No peripancreatic fluid  collection. Spleen: Unremarkable. Adrenals/Urinary Tract: --Adrenal glands: Unremarkable. --Right kidney/ureter: No hydronephrosis or radiopaque kidney stones. --Left kidney/ureter: No hydronephrosis or radiopaque kidney stones. --Urinary bladder: Unremarkable. Stomach/Bowel: --Stomach/Duodenum: No hiatal hernia or other gastric abnormality. Normal duodenal course and caliber. --Small bowel: Unremarkable. --Colon: There is a large amount of stool in the --Appendix: Normal. Lymphatic: --No retroperitoneal lymphadenopathy. --No mesenteric lymphadenopathy. --No pelvic or inguinal lymphadenopathy. Reproductive: Unremarkable Other: No ascites or free air. There is a 5.8 x 5.7 cm hematoma overlying the proximal right hip. There is a partially visualized fluid collection measuring approximately 3.8 cm in the low anterior abdominal wall on the right. Mild anasarca is noted. Musculoskeletal. Advanced multilevel degenerative changes are noted throughout the visualized portions of the thoracolumbar spine. There is no acute displaced fracture. IMPRESSION: 1. There is a partially visualized DVT in the right lower extremity. This DVT appears to be centered within the right profunda femoris and superficial femoral veins. This DVT nearly extends into the right common femoral vein. There is no evidence for proximal thrombosis within the iliac veins or IVC. If the patient is not a candidate for anticoagulation, IVC filter placement should be strongly considered. 2. Small bilateral pleural effusions. 3. Large amount  of stool in the visualized portions of the colon. 4. Right hip hematoma again identified. 5. Anasarca. Aortic Atherosclerosis (ICD10-I70.0). These results will be called to the ordering clinician or representative by the Radiologist Assistant, and communication documented in the PACS or [employer]. Electronically Signed   By: Katherine Mantle M.D.   On: 05/03/2020 21:41   DG Hip Unilat W or Wo Pelvis 2-3 Views  Left  Result Date: 04/21/2020 CLINICAL DATA:  Fall from stretcher, dementia EXAM: DG HIP (WITH OR WITHOUT PELVIS) 2-3V LEFT COMPARISON:  None. FINDINGS: No pelvic fracture or diastasis. No left hip fracture or dislocation. No suspicious focal osseous lesions. Mild left hip osteoarthritis. Prominent degenerative changes in the visualized lower lumbar spine. IMPRESSION: No fracture. No left hip dislocation. Mild left hip osteoarthritis. Prominent degenerative changes in the visualized lower lumbar spine. Electronically Signed   By: Delbert Phenix M.D.   On: 04/21/2020 06:16   DG Hip Unilat W or Wo Pelvis 2-3 Views Right  Result Date: 04/21/2020 CLINICAL DATA:  Fall from stretcher, dementia EXAM: DG HIP (WITH OR WITHOUT PELVIS) 2-3V RIGHT COMPARISON:  04/20/2020 pelvic radiograph FINDINGS: Lateral right hip soft tissue swelling. No pelvic fracture or diastasis. No right hip fracture or dislocation. Mild right hip osteoarthritis. No suspicious focal osseous lesions. Prominent degenerative changes in the visualized lower lumbar spine. IMPRESSION: Lateral right hip soft tissue swelling. No right hip fracture or dislocation. Mild right hip osteoarthritis. Electronically Signed   By: Delbert Phenix M.D.   On: 04/21/2020 06:18    Microbiology Recent Results (from the past 240 hour(s))  Blood Culture (routine x 2)     Status: Abnormal (Preliminary result)   Collection Time: 05/07/2020  9:40 AM   Specimen: BLOOD  Result Value Ref Range Status   Specimen Description   Final    BLOOD RIGHT FA Performed at [facility], [address] [place], [place] [address]    Special Requests   Final    BOTTLES DRAWN AEROBIC AND ANAEROBIC Blood Culture results may not be optimal due to an excessive volume of blood received in culture bottles Performed at [facility], [address] [place], [place] [address]    Culture  Setup Time   Final    Organism ID to follow GRAM NEGATIVE RODS ANAEROBIC BOTTLE  ONLY GRAM POSITIVE COCCI AEROBIC BOTTLE ONLY CRITICAL RESULT CALLED TO, READ BACK BY AND VERIFIED WITH: [place] SHANLEVER AT 1430 ON 05/14/20 SNG Performed at [facility], [address] [place], [place] [address]    Culture (A)  Final    STAPHYLOCOCCUS SPECIES (COAGULASE NEGATIVE) THE SIGNIFICANCE OF ISOLATING THIS ORGANISM FROM A SINGLE SET OF BLOOD CULTURES WHEN MULTIPLE SETS ARE DRAWN IS UNCERTAIN. PLEASE NOTIFY THE MICROBIOLOGY DEPARTMENT WITHIN ONE WEEK IF SPECIATION AND SENSITIVITIES ARE REQUIRED. FUSOBACTERIUM NUCLEATUM BETA LACTAMASE NEGATIVE CULTURE REINCUBATED FOR BETTER GROWTH Performed at [facility], [address] [place], [place] [address]    Report Status PENDING  Incomplete  Blood Culture (routine x 2)     Status: Abnormal (Preliminary result)   Collection Time: 05/05/2020  9:40 AM   Specimen: BLOOD  Result Value Ref Range Status   Specimen Description   Final    BLOOD RIGHT [facility] Performed at [facility], [address] [place], [place] [address]    Special Requests   Final    BOTTLES DRAWN AEROBIC AND ANAEROBIC Blood Culture adequate volume Performed at [facility], [address]  [place], [place] [address]    Culture  Setup Time   Final    ANAEROBIC BOTTLE ONLY GRAM NEGATIVE RODS CRITICAL VALUE NOTED.  VALUE IS CONSISTENT WITH PREVIOUSLY REPORTED AND CALLED VALUE. Performed at [facility], [address], [place], [place] [address]    Culture (A)  Final    ANAEROBIC GRAM NEGATIVE ROD CULTURE REINCUBATED FOR BETTER GROWTH Performed at [facility], [address], [place], [place] [address]    Report Status PENDING  Incomplete  Urine culture     Status: Abnormal   Collection Time: 04/25/2020  9:40 AM   Specimen: Urine, Random  Result Value Ref Range Status   Specimen Description   Final    URINE, RANDOM Performed at [facility], [address] [place], [place] [address]    Special  Requests   Final    NONE Performed at [facility], [address] [place], [place] [address]    Culture >=100,000 COLONIES/mL KLEBSIELLA PNEUMONIAE (A)  Final   Report Status 05/15/2020 FINAL  Final   Organism ID, Bacteria KLEBSIELLA PNEUMONIAE (A)  Final      Susceptibility   Klebsiella pneumoniae - MIC*    AMPICILLIN >=32 RESISTANT Resistant     CEFAZOLIN <=4 SENSITIVE Sensitive     CEFTRIAXONE <=0.25 SENSITIVE Sensitive     CIPROFLOXACIN <=0.25 SENSITIVE Sensitive     GENTAMICIN <=1 SENSITIVE Sensitive     IMIPENEM <=0.25 SENSITIVE Sensitive     NITROFURANTOIN 64 INTERMEDIATE Intermediate     TRIMETH/SULFA <=20 SENSITIVE Sensitive     AMPICILLIN/SULBACTAM 4 SENSITIVE Sensitive     PIP/TAZO <=4 SENSITIVE Sensitive     * >=100,000 COLONIES/mL KLEBSIELLA PNEUMONIAE  Blood Culture ID Panel (Reflexed)     Status: None   Collection Time: 04/27/2020  9:40 AM  Result Value Ref Range Status   Enterococcus species NOT DETECTED NOT DETECTED Final   Listeria monocytogenes NOT DETECTED NOT DETECTED Final   Staphylococcus species NOT DETECTED NOT DETECTED Final   Staphylococcus aureus (BCID) NOT DETECTED NOT DETECTED Final   Streptococcus species NOT DETECTED NOT DETECTED Final   Streptococcus agalactiae NOT DETECTED NOT DETECTED Final   Streptococcus pneumoniae NOT DETECTED NOT DETECTED Final   Streptococcus pyogenes NOT DETECTED NOT DETECTED Final   Acinetobacter baumannii NOT DETECTED NOT DETECTED Final   Enterobacteriaceae species NOT DETECTED NOT DETECTED Final   Enterobacter cloacae complex NOT DETECTED NOT DETECTED Final   Escherichia coli NOT DETECTED NOT DETECTED Final   Klebsiella oxytoca NOT DETECTED NOT DETECTED Final   Klebsiella pneumoniae NOT DETECTED NOT DETECTED Final   Proteus species NOT DETECTED NOT DETECTED Final   Serratia marcescens NOT DETECTED NOT DETECTED Final   Haemophilus influenzae NOT DETECTED NOT DETECTED Final   Neisseria meningitidis NOT  DETECTED NOT DETECTED Final   Pseudomonas aeruginosa NOT DETECTED NOT DETECTED Final   Candida albicans NOT DETECTED NOT DETECTED Final   Candida glabrata NOT DETECTED NOT DETECTED Final   Candida krusei NOT DETECTED NOT DETECTED Final   Candida parapsilosis NOT DETECTED NOT DETECTED Final   Candida tropicalis NOT DETECTED NOT DETECTED Final    Comment: Performed at [facility], [address] [place], [place] [address]  Blood Culture ID Panel (Reflexed)     Status: Abnormal   Collection Time: 05/20/2020  9:40 AM  Result Value Ref Range Status   Enterococcus species NOT DETECTED NOT DETECTED Final   Listeria monocytogenes NOT DETECTED NOT DETECTED Final  Staphylococcus species DETECTED (A) NOT DETECTED Final    Comment: Methicillin (oxacillin) susceptible coagulase negative staphylococcus. Possible blood culture contaminant (unless isolated from more than one blood culture draw or clinical case suggests pathogenicity). No antibiotic treatment is indicated for blood  culture contaminants. CRITICAL RESULT CALLED TO, READ BACK BY AND VERIFIED WITH: CHARLES SHANLEVER AT 1430 ON 05/14/20 SNG    Staphylococcus aureus (BCID) NOT DETECTED NOT DETECTED Final   Methicillin resistance NOT DETECTED NOT DETECTED Final   Streptococcus species NOT DETECTED NOT DETECTED Final   Streptococcus agalactiae NOT DETECTED NOT DETECTED Final   Streptococcus pneumoniae NOT DETECTED NOT DETECTED Final   Streptococcus pyogenes NOT DETECTED NOT DETECTED Final   Acinetobacter baumannii NOT DETECTED NOT DETECTED Final   Enterobacteriaceae species NOT DETECTED NOT DETECTED Final   Enterobacter cloacae complex NOT DETECTED NOT DETECTED Final   Escherichia coli NOT DETECTED NOT DETECTED Final   Klebsiella oxytoca NOT DETECTED NOT DETECTED Final   Klebsiella pneumoniae NOT DETECTED NOT DETECTED Final   Proteus species NOT DETECTED NOT DETECTED Final   Serratia marcescens NOT DETECTED NOT DETECTED Final    Haemophilus influenzae NOT DETECTED NOT DETECTED Final   Neisseria meningitidis NOT DETECTED NOT DETECTED Final   Pseudomonas aeruginosa NOT DETECTED NOT DETECTED Final   Candida albicans NOT DETECTED NOT DETECTED Final   Candida glabrata NOT DETECTED NOT DETECTED Final   Candida krusei NOT DETECTED NOT DETECTED Final   Candida parapsilosis NOT DETECTED NOT DETECTED Final   Candida tropicalis NOT DETECTED NOT DETECTED Final    Comment: Performed at [facility], [address] [place], [place] [address]  SARS Coronavirus 2 by RT PCR (hospital order, performed in [facility] hospital lab) Nasopharyngeal Nasopharyngeal Swab     Status: None   Collection Time: 04/26/2020 11:01 AM   Specimen: Nasopharyngeal Swab  Result Value Ref Range Status   SARS Coronavirus 2 NEGATIVE NEGATIVE Final    Comment: (NOTE) SARS-CoV-2 target nucleic acids are NOT DETECTED.  The SARS-CoV-2 RNA is generally detectable in upper and lower respiratory specimens during the acute phase of infection. The lowest concentration of SARS-CoV-2 viral copies this assay can detect is 250 copies / mL. A negative result does not preclude SARS-CoV-2 infection and should not be used as the sole basis for treatment or other patient management decisions.  A negative result may occur with improper specimen collection / handling, submission of specimen other than nasopharyngeal swab, presence of viral mutation(s) within the areas targeted by this assay, and inadequate number of viral copies (<250 copies / mL). A negative result must be combined with clinical observations, patient history, and epidemiological information.  Fact Sheet for Patients:   BoilerBrush.com.cy  Fact Sheet for Healthcare Providers: https://pope.com/  This test is not yet approved or  cleared by the [place] FDA and has been authorized for detection and/or diagnosis of SARS-CoV-2 by FDA under  an Emergency Use Authorization (EUA).  This EUA will remain in effect (meaning this test can be used) for the duration of the COVID-19 declaration under Section 564(b)(1) of the Act, 21 U.S.C. section 360bbb-3(b)(1), unless the authorization is terminated or revoked sooner.  Performed at [facility], [address] [place], [place] [address]   MRSA PCR Screening     Status: None   Collection Time: 05/10/2020 11:33 PM   Specimen: Nasopharyngeal  Result Value Ref Range Status   MRSA by PCR NEGATIVE NEGATIVE Final    Comment:  The GeneXpert MRSA Assay (FDA approved for NASAL specimens only), is one component of a comprehensive MRSA colonization surveillance program. It is not intended to diagnose MRSA infection nor to guide or monitor treatment for MRSA infections. Performed at [facility], [address] [place], [place] [address]     Lab Basic Metabolic Panel: Recent Labs  Lab 05/14/20 0234 05/14/20 0234 05/15/20 0408 05/16/20 0610 05/17/20 0627 05/18/20 0507 June 13, 2020 0425  NA 139   < > 141 141 143 145 146*  K 5.2*   < > 4.7 4.3 4.0 3.4* 5.6*  CL 106   < > 109 110 113* 112* 113*  CO2 21*   < > 22 18* 16* 19* 8*  GLUCOSE 212*   < > 156* 153* 200* 224* 149*  BUN 54*   < > 64* 69* 74* 79* 84*  CREATININE 1.60*   < > 1.71* 1.69* 1.80* 1.76* 2.22*  CALCIUM 8.0*   < > 7.6* 7.9* 8.1* 8.5* 8.4*  MG 2.1  --  2.2  --   --  2.4  --   PHOS 4.2  --  3.6  --   --   --   --    < > = values in this interval not displayed.   Liver Function Tests: Recent Labs  Lab 04/29/2020 0940 05/14/20 0234 05/16/20 0610  AST 17 26 47*  ALT 13 13 18   ALKPHOS 62 46 61  BILITOT 1.5* 0.9 1.1  PROT 5.1* 4.9* 4.4*  ALBUMIN 1.8* 1.9* 1.6*   No results for input(s): LIPASE, AMYLASE in the last 168 hours. Recent Labs  Lab 05/04/2020 1101  AMMONIA <9*   CBC: Recent Labs  Lab 05/04/2020 0940 05/20/2020 0940 05/14/20 0234 05/15/20 0408 05/17/20 0627 05/18/20 0507  06/13/20 0425  WBC 6.7  --  12.8* 10.3 7.7 9.4  --   NEUTROABS 4.9  --  11.6* 9.1* 6.5  --   --   HGB 9.4*   < > 8.7* 8.5* 7.6* 7.9* 9.5*  HCT 29.5*   < > 28.0* 26.6* 23.2* 25.0* 32.8*  MCV 92.8  --  93.3 91.7 89.6 90.3  --   PLT 385  --  368 263 242 247  --    < > = values in this interval not displayed.   Cardiac Enzymes: No results for input(s): CKTOTAL, CKMB, CKMBINDEX, TROPONINI in the last 168 hours. Sepsis Labs: Recent Labs  Lab 05/12/2020 0940 04/25/2020 1101 05/14/20 0234 05/14/20 0234 05/14/20 0618 05/15/20 0408 05/16/20 0610 05/16/20 1104 05/16/20 1306 05/17/20 0627 05/18/20 0507  PROCALCITON 14.83  --   --   --   --  21.25 14.28  --   --  7.98  --   WBC 6.7   < > 12.8*  --   --  10.3  --   --   --  7.7 9.4  LATICACIDVEN 2.8*   < > 3.3*   < > 2.8* 2.2*  --  1.4 1.7  --   --    < > = values in this interval not displayed.    Procedures/Operations   None  Edsel Petrin 13-Jun-2020, 8:30 AM

## 2020-05-22 NOTE — Progress Notes (Signed)
Patient was pronounced death at [phone] 2020-06-02. RN attempted to notify niece via telephone. Niece did not answer. RN left voicemail instruction her to call back to the unit at her earliest convenience. NP notified at bedside.

## 2020-05-22 DEATH — deceased

## 2022-05-23 IMAGING — CT CT ANGIO CHEST
1 of 6 series · 17 of 36 positions shown · IV contrast (APPLIED)
Comparison: 05/04/2019

CLINICAL DATA: Significant shortness of breath, history of known
deep venous thrombosis and recent filter placement

EXAM:
CT ANGIOGRAPHY CHEST
CT ABDOMEN AND PELVIS WITH CONTRAST
TECHNIQUE: Multidetector CT imaging of the chest was performed using the
standard protocol during bolus administration of intravenous
contrast. Multiplanar CT image reconstructions and MIPs were
obtained to evaluate the vascular anatomy. Multidetector CT imaging
of the abdomen and pelvis was performed using the standard protocol
during bolus administration of intravenous contrast.
CONTRAST:  60mL OMNIPAQUE IOHEXOL 350 MG/ML SOLN

[Series 6: thins · axial · 0.65mm/px · z∈[-481,-254]mm · 17 of 253 slices shown]
[im 13/253  lung]
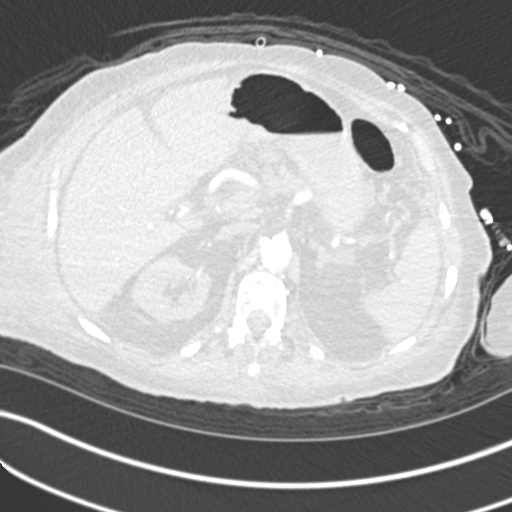
[im 26/253  mediastinal]
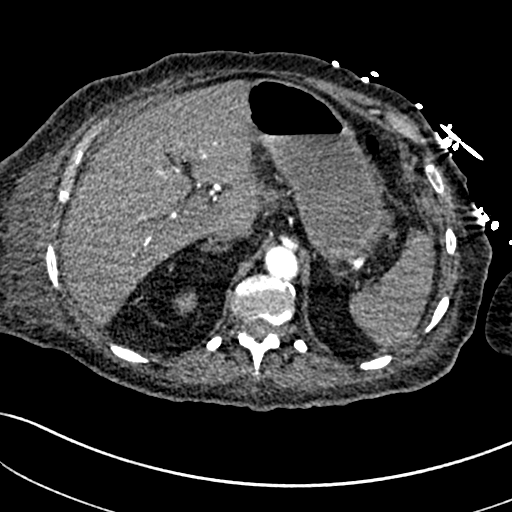
[im 38/253  lung]
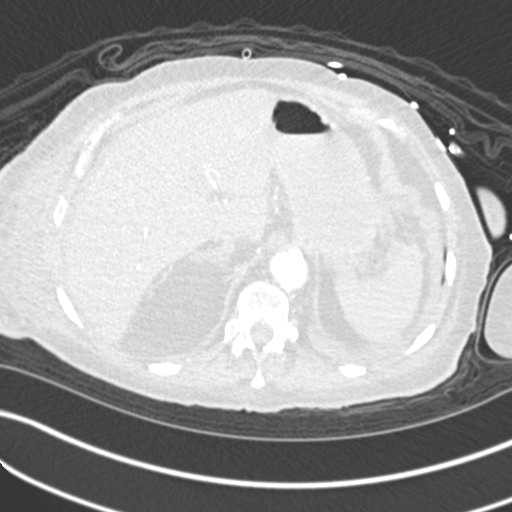
[im 51/253  mediastinal]
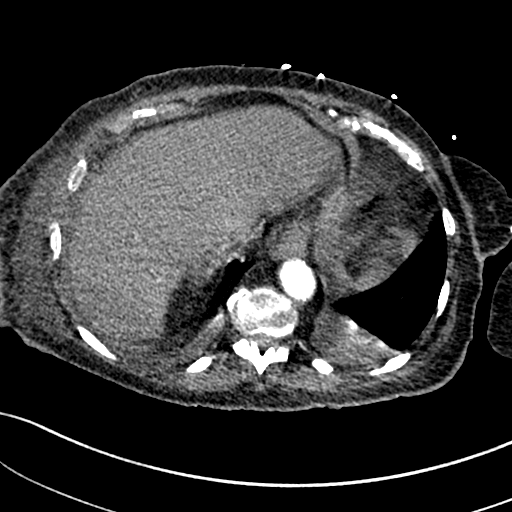
[im 76/253  lung]
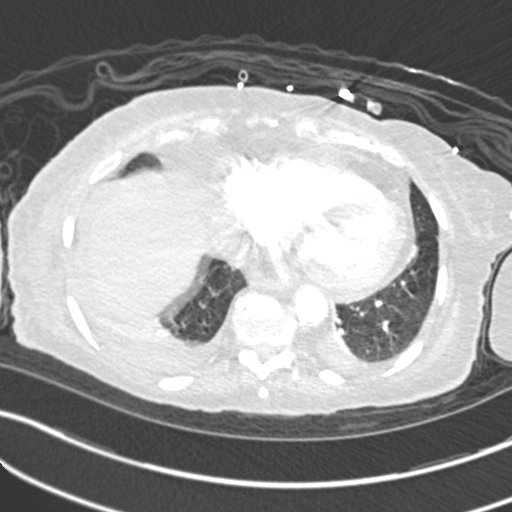
[im 89/253  mediastinal]
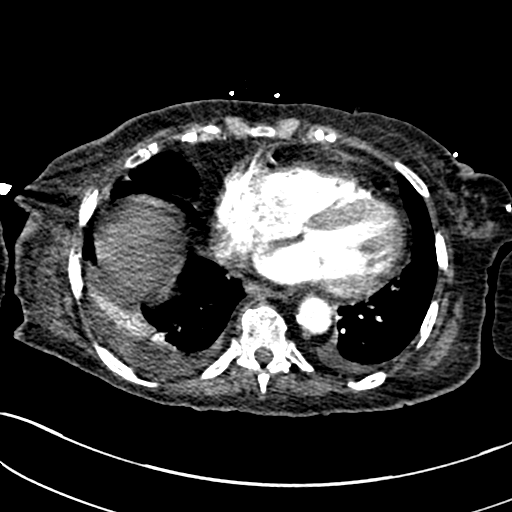
[im 101/253  lung]
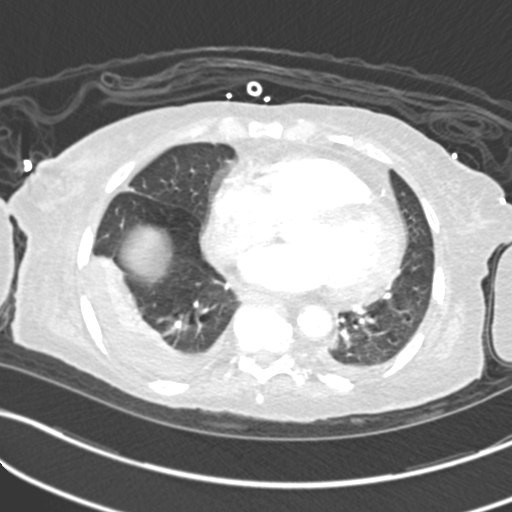
[im 114/253  mediastinal]
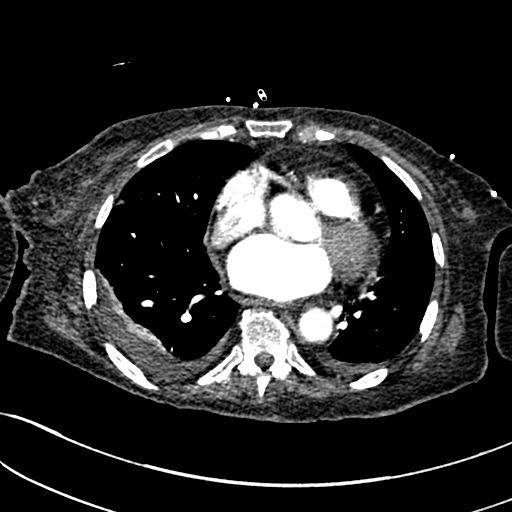
[im 127/253  lung]
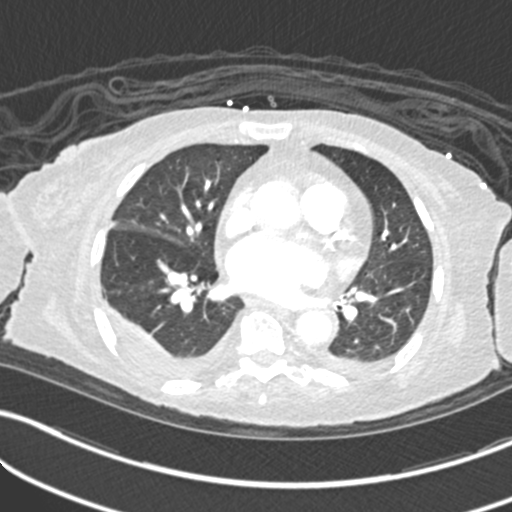
[im 139/253  mediastinal]
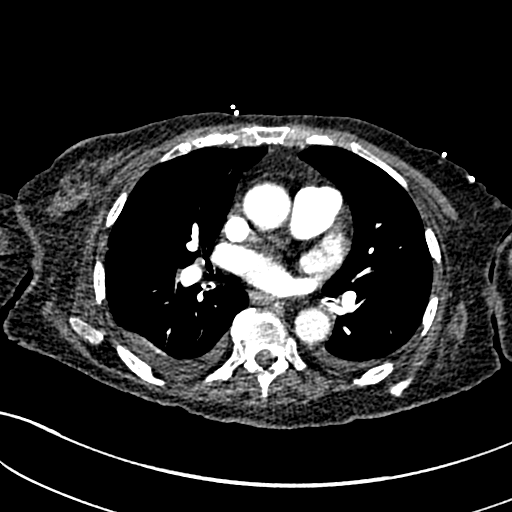
[im 152/253  lung]
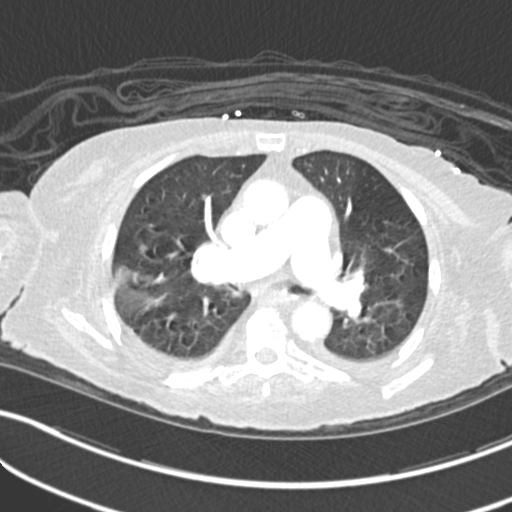
[im 164/253  mediastinal]
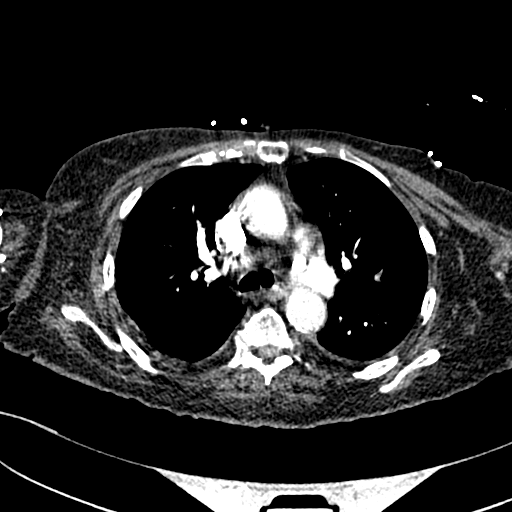
[im 177/253  lung]
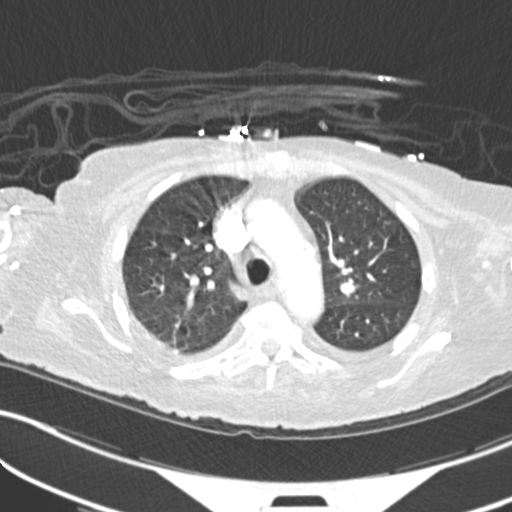
[im 202/253  mediastinal]
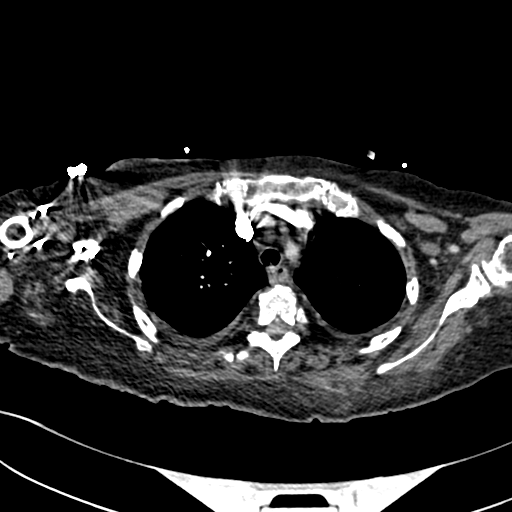
[im 215/253  lung]
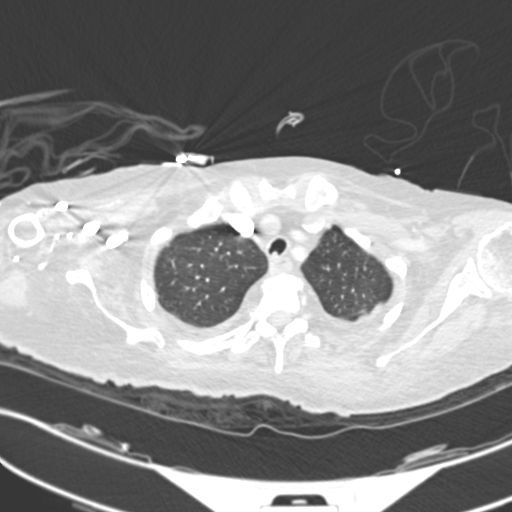
[im 227/253  mediastinal]
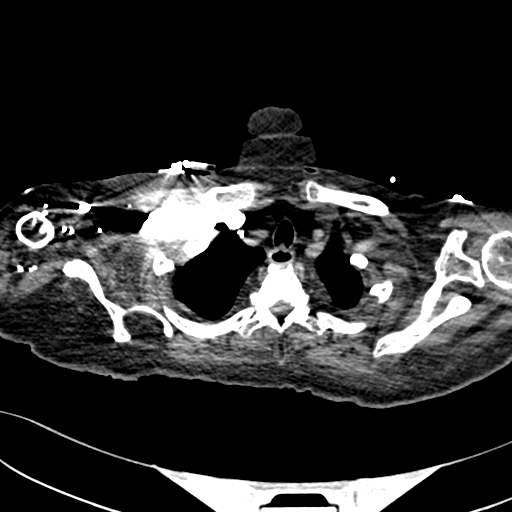
[im 240/253  lung]
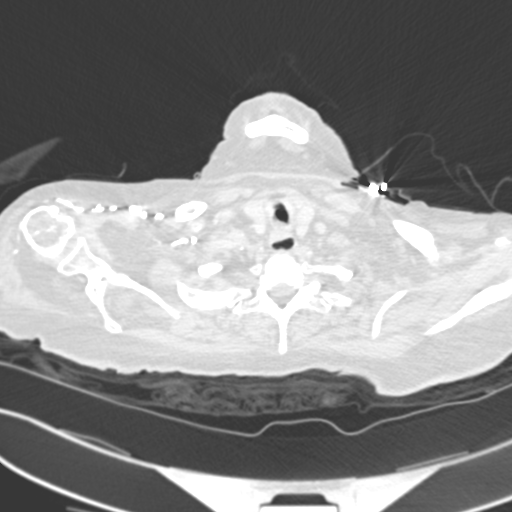

[17 of 36 positions shown; findings below may reference images not displayed]

FINDINGS: CTA CHEST FINDINGS

Cardiovascular: Thoracic aorta demonstrates mild atherosclerotic
calcification without aneurysmal dilatation or dissection. Cardiac
enlargement is noted. The pulmonary artery shows a normal branching
pattern bilaterally. No sizable filling defects to suggest pulmonary
emboli are seen. Coronary calcifications are noted. No sizable
pericardial effusion is seen.

Mediastinum/Nodes: Thoracic inlet is within normal limits. No
sizable hilar or mediastinal adenopathy is noted. The esophagus is
within normal limits.

Lungs/Pleura: Small bilateral pleural effusions are noted right
greater than left with mild associated compressive atelectasis again
right greater than left. The lungs are otherwise well aerated
without focal infiltrate. No sizable parenchymal nodules are seen.
No pneumothoraces are noted.

Musculoskeletal: Degenerative changes of the thoracic spine are
noted. No acute bony abnormality is seen.

Review of the MIP images confirms the above findings.

CT ABDOMEN and PELVIS FINDINGS

Hepatobiliary: Liver is well visualized without focal mass lesion.
The gallbladder is well distended although no gallstones are noted.

Pancreas: Unremarkable. No pancreatic ductal dilatation or
surrounding inflammatory changes.

Spleen: Normal in size without focal abnormality.

Adrenals/Urinary Tract: Adrenal glands are within normal limits.
Kidneys demonstrate a normal enhancement pattern. No renal calculi
or urinary tract obstructive changes are seen. Small left renal cyst
is noted. The bladder is well distended. Small focus of air is noted
within the bladder consistent with recent instrumentation.

Stomach/Bowel: Appendix is well visualized and within normal limits.
Fecal material is noted throughout the colon. The sigmoid colon is
distended with changes of diverticular disease as well as focal wall
thickening consistent with focal diverticulitis. Large stool burden
in the sigmoid is noted which may be related to a degree of
impaction. Multiple extraluminal foci of air are noted consistent
with mild perforation likely related to this focal inflammatory
change. Small bowel appears within normal limits with the exception
of some compensatory inflammatory change related to the sigmoid
changes. Stomach is unremarkable.

Vascular/Lymphatic: Atherosclerotic calcifications are noted. IVC
filter is noted in place. Right-sided DVT is again noted and stable.
No definitive thrombus within the IVC filter is seen. No significant
lymphadenopathy is noted.

Reproductive: Uterus and bilateral adnexa are unremarkable.

Other: Minimal free fluid is noted related to the perforation.
Minimal changes of anasarca are noted.

Musculoskeletal: Degenerative changes of lumbar spine are again
seen. Stable hematoma over the right hip is noted.

Review of the MIP images confirms the above findings.
IMPRESSION: CTA of the chest: No evidence of pulmonary embolism.

Mild bibasilar atelectasis and small effusions right greater than
left.

CT of the abdomen and pelvis: Changes of diverticulitis in the
sigmoid colon with a large focal stool burden which may represent
some impaction. Associated inflammatory changes are noted as well as
findings of microperforations. Small amount of free fluid is noted
as well.

Distended gallbladder of uncertain significance. This is stable from
the prior exam.

Stable hematoma over the right hip.
